# Patient Record
Sex: Female | Born: 1960
Health system: Southern US, Community
[De-identification: ages and names within clinical notes are randomized; demographics above are authoritative.]

## PROBLEM LIST (undated history)

## (undated) ENCOUNTER — Encounter: Attending: Dermatology | Primary: Dermatology

## (undated) ENCOUNTER — Ambulatory Visit

## (undated) ENCOUNTER — Encounter: Attending: Family | Primary: Family

## (undated) ENCOUNTER — Telehealth

## (undated) ENCOUNTER — Encounter

## (undated) ENCOUNTER — Encounter: Payer: PRIVATE HEALTH INSURANCE | Attending: Neurological Surgery | Primary: Neurological Surgery

## (undated) ENCOUNTER — Encounter: Attending: Family Medicine | Primary: Family Medicine

## (undated) ENCOUNTER — Encounter: Attending: Adult Health | Primary: Adult Health

## (undated) ENCOUNTER — Ambulatory Visit: Payer: PRIVATE HEALTH INSURANCE

## (undated) ENCOUNTER — Encounter: Attending: Diagnostic Radiology | Primary: Diagnostic Radiology

## (undated) DIAGNOSIS — K219 Gastro-esophageal reflux disease without esophagitis: Secondary | ICD-10-CM

## (undated) DIAGNOSIS — I10 Essential (primary) hypertension: Secondary | ICD-10-CM

## (undated) DIAGNOSIS — K2970 Gastritis, unspecified, without bleeding: Secondary | ICD-10-CM

## (undated) DIAGNOSIS — M3500 Sicca syndrome, unspecified: Secondary | ICD-10-CM

## (undated) DIAGNOSIS — F329 Major depressive disorder, single episode, unspecified: Secondary | ICD-10-CM

## (undated) DIAGNOSIS — L732 Hidradenitis suppurativa: Secondary | ICD-10-CM

## (undated) DIAGNOSIS — R112 Nausea with vomiting, unspecified: Secondary | ICD-10-CM

## (undated) DIAGNOSIS — Z9889 Other specified postprocedural states: Secondary | ICD-10-CM

## (undated) DIAGNOSIS — F419 Anxiety disorder, unspecified: Secondary | ICD-10-CM

## (undated) DIAGNOSIS — E669 Obesity, unspecified: Secondary | ICD-10-CM

## (undated) DIAGNOSIS — L719 Rosacea, unspecified: Secondary | ICD-10-CM

## (undated) DIAGNOSIS — IMO0002 Reserved for concepts with insufficient information to code with codable children: Secondary | ICD-10-CM

## (undated) DIAGNOSIS — F988 Other specified behavioral and emotional disorders with onset usually occurring in childhood and adolescence: Secondary | ICD-10-CM

## (undated) DIAGNOSIS — F32A Depression, unspecified: Secondary | ICD-10-CM

## (undated) DIAGNOSIS — Z22322 Carrier or suspected carrier of Methicillin resistant Staphylococcus aureus: Secondary | ICD-10-CM

## (undated) DIAGNOSIS — T8859XA Other complications of anesthesia, initial encounter: Secondary | ICD-10-CM

## (undated) DIAGNOSIS — T4145XA Adverse effect of unspecified anesthetic, initial encounter: Secondary | ICD-10-CM

## (undated) DIAGNOSIS — Z8614 Personal history of Methicillin resistant Staphylococcus aureus infection: Secondary | ICD-10-CM

## (undated) DIAGNOSIS — K625 Hemorrhage of anus and rectum: Secondary | ICD-10-CM

## (undated) HISTORY — PX: TONSILLECTOMY: SUR1361

## (undated) HISTORY — DX: Anxiety disorder, unspecified: F41.9

## (undated) HISTORY — DX: Gastritis, unspecified, without bleeding: K29.70

## (undated) HISTORY — DX: Hemorrhage of anus and rectum: K62.5

## (undated) HISTORY — DX: Carrier or suspected carrier of methicillin resistant Staphylococcus aureus: Z22.322

## (undated) HISTORY — DX: Sjogren syndrome, unspecified: M35.00

## (undated) HISTORY — PX: ANTERIOR CRUCIATE LIGAMENT REPAIR: SHX115

## (undated) HISTORY — DX: Reserved for concepts with insufficient information to code with codable children: IMO0002

## (undated) HISTORY — DX: Gastro-esophageal reflux disease without esophagitis: K21.9

## (undated) HISTORY — DX: Depression, unspecified: F32.A

## (undated) HISTORY — PX: MICROLARYNGOSCOPY: SHX5208

## (undated) HISTORY — DX: Rosacea, unspecified: L71.9

## (undated) HISTORY — DX: Essential (primary) hypertension: I10

## (undated) HISTORY — DX: Major depressive disorder, single episode, unspecified: F32.9

## (undated) HISTORY — DX: Obesity, unspecified: E66.9

## (undated) HISTORY — DX: Other specified behavioral and emotional disorders with onset usually occurring in childhood and adolescence: F98.8

## (undated) HISTORY — PX: PILONIDAL CYST DRAINAGE: SHX743

## (undated) HISTORY — DX: Hidradenitis suppurativa: L73.2

---

## 1995-12-01 HISTORY — PX: SPINAL FUSION: SHX223

## 1999-12-29 ENCOUNTER — Encounter: Payer: Self-pay | Admitting: Neurosurgery

## 1999-12-29 ENCOUNTER — Encounter: Admission: RE | Admit: 1999-12-29 | Discharge: 1999-12-29 | Payer: Self-pay | Admitting: Neurosurgery

## 2000-03-15 ENCOUNTER — Encounter: Payer: Self-pay | Admitting: Family Medicine

## 2000-04-25 ENCOUNTER — Encounter: Payer: Self-pay | Admitting: Family Medicine

## 2000-04-25 ENCOUNTER — Encounter: Admission: RE | Admit: 2000-04-25 | Discharge: 2000-04-25 | Payer: Self-pay | Admitting: Family Medicine

## 2002-01-02 ENCOUNTER — Other Ambulatory Visit: Admission: RE | Admit: 2002-01-02 | Discharge: 2002-01-02 | Payer: Self-pay | Admitting: Family Medicine

## 2002-01-08 ENCOUNTER — Encounter: Admission: RE | Admit: 2002-01-08 | Discharge: 2002-01-08 | Payer: Self-pay | Admitting: Family Medicine

## 2002-01-08 ENCOUNTER — Encounter: Payer: Self-pay | Admitting: Family Medicine

## 2003-01-15 ENCOUNTER — Other Ambulatory Visit: Admission: RE | Admit: 2003-01-15 | Discharge: 2003-01-15 | Payer: Self-pay | Admitting: Family Medicine

## 2003-01-21 ENCOUNTER — Encounter: Payer: Self-pay | Admitting: Family Medicine

## 2003-01-21 ENCOUNTER — Encounter: Admission: RE | Admit: 2003-01-21 | Discharge: 2003-01-21 | Payer: Self-pay | Admitting: Family Medicine

## 2004-01-19 ENCOUNTER — Other Ambulatory Visit: Admission: RE | Admit: 2004-01-19 | Discharge: 2004-01-19 | Payer: Self-pay | Admitting: Family Medicine

## 2004-05-12 ENCOUNTER — Encounter: Admission: RE | Admit: 2004-05-12 | Discharge: 2004-05-12 | Payer: Self-pay | Admitting: Neurosurgery

## 2004-06-01 ENCOUNTER — Encounter: Admission: RE | Admit: 2004-06-01 | Discharge: 2004-06-01 | Payer: Self-pay | Admitting: Neurosurgery

## 2004-10-18 ENCOUNTER — Ambulatory Visit: Payer: Self-pay | Admitting: Family Medicine

## 2004-12-21 ENCOUNTER — Ambulatory Visit: Payer: Self-pay | Admitting: Family Medicine

## 2005-03-28 ENCOUNTER — Other Ambulatory Visit: Admission: RE | Admit: 2005-03-28 | Discharge: 2005-03-28 | Payer: Self-pay | Admitting: Family Medicine

## 2005-03-28 ENCOUNTER — Ambulatory Visit: Payer: Self-pay | Admitting: Family Medicine

## 2005-04-21 ENCOUNTER — Encounter: Admission: RE | Admit: 2005-04-21 | Discharge: 2005-04-21 | Payer: Self-pay | Admitting: Family Medicine

## 2005-05-03 ENCOUNTER — Encounter: Admission: RE | Admit: 2005-05-03 | Discharge: 2005-05-03 | Payer: Self-pay | Admitting: Family Medicine

## 2005-11-20 ENCOUNTER — Ambulatory Visit: Payer: Self-pay | Admitting: Family Medicine

## 2006-06-05 ENCOUNTER — Other Ambulatory Visit: Admission: RE | Admit: 2006-06-05 | Discharge: 2006-06-05 | Payer: Self-pay | Admitting: Family Medicine

## 2006-06-05 ENCOUNTER — Ambulatory Visit: Payer: Self-pay | Admitting: Family Medicine

## 2006-06-05 ENCOUNTER — Encounter: Payer: Self-pay | Admitting: Family Medicine

## 2006-06-05 LAB — CONVERTED CEMR LAB: Pap Smear: NORMAL

## 2006-06-27 ENCOUNTER — Encounter: Admission: RE | Admit: 2006-06-27 | Discharge: 2006-06-27 | Payer: Self-pay | Admitting: Family Medicine

## 2006-07-10 ENCOUNTER — Ambulatory Visit: Payer: Self-pay | Admitting: Family Medicine

## 2006-09-12 ENCOUNTER — Ambulatory Visit: Payer: Self-pay | Admitting: Family Medicine

## 2007-01-14 ENCOUNTER — Ambulatory Visit: Payer: Self-pay | Admitting: Family Medicine

## 2007-02-04 ENCOUNTER — Ambulatory Visit: Payer: Self-pay | Admitting: Family Medicine

## 2007-02-21 ENCOUNTER — Encounter: Payer: Self-pay | Admitting: Family Medicine

## 2007-02-21 DIAGNOSIS — F32A Depression, unspecified: Secondary | ICD-10-CM | POA: Insufficient documentation

## 2007-02-21 DIAGNOSIS — J309 Allergic rhinitis, unspecified: Secondary | ICD-10-CM | POA: Insufficient documentation

## 2007-02-21 DIAGNOSIS — L719 Rosacea, unspecified: Secondary | ICD-10-CM | POA: Insufficient documentation

## 2007-02-21 DIAGNOSIS — K219 Gastro-esophageal reflux disease without esophagitis: Secondary | ICD-10-CM | POA: Insufficient documentation

## 2007-02-21 DIAGNOSIS — IMO0002 Reserved for concepts with insufficient information to code with codable children: Secondary | ICD-10-CM | POA: Insufficient documentation

## 2007-02-21 DIAGNOSIS — J329 Chronic sinusitis, unspecified: Secondary | ICD-10-CM | POA: Insufficient documentation

## 2007-02-21 DIAGNOSIS — F411 Generalized anxiety disorder: Secondary | ICD-10-CM | POA: Insufficient documentation

## 2007-02-21 DIAGNOSIS — F329 Major depressive disorder, single episode, unspecified: Secondary | ICD-10-CM

## 2007-04-05 ENCOUNTER — Encounter: Payer: Self-pay | Admitting: Family Medicine

## 2007-04-30 ENCOUNTER — Telehealth: Payer: Self-pay | Admitting: Family Medicine

## 2007-10-09 ENCOUNTER — Ambulatory Visit: Payer: Self-pay | Admitting: Family Medicine

## 2007-10-09 DIAGNOSIS — I10 Essential (primary) hypertension: Secondary | ICD-10-CM | POA: Insufficient documentation

## 2007-10-14 ENCOUNTER — Ambulatory Visit: Payer: Self-pay | Admitting: Family Medicine

## 2007-10-14 DIAGNOSIS — D72829 Elevated white blood cell count, unspecified: Secondary | ICD-10-CM | POA: Insufficient documentation

## 2007-10-17 ENCOUNTER — Telehealth (INDEPENDENT_AMBULATORY_CARE_PROVIDER_SITE_OTHER): Payer: Self-pay | Admitting: *Deleted

## 2007-11-01 ENCOUNTER — Telehealth (INDEPENDENT_AMBULATORY_CARE_PROVIDER_SITE_OTHER): Payer: Self-pay | Admitting: *Deleted

## 2007-11-06 ENCOUNTER — Ambulatory Visit: Payer: Self-pay | Admitting: Family Medicine

## 2007-11-27 ENCOUNTER — Encounter: Payer: Self-pay | Admitting: Family Medicine

## 2007-12-16 ENCOUNTER — Ambulatory Visit: Payer: Self-pay | Admitting: Cardiology

## 2007-12-25 ENCOUNTER — Ambulatory Visit: Payer: Self-pay | Admitting: Family Medicine

## 2007-12-25 ENCOUNTER — Other Ambulatory Visit: Admission: RE | Admit: 2007-12-25 | Discharge: 2007-12-25 | Payer: Self-pay | Admitting: Family Medicine

## 2007-12-25 ENCOUNTER — Encounter: Payer: Self-pay | Admitting: Family Medicine

## 2007-12-26 LAB — CONVERTED CEMR LAB
AST: 30 units/L (ref 0–37)
Alkaline Phosphatase: 68 units/L (ref 39–117)
Basophils Absolute: 0.1 10*3/uL (ref 0.0–0.1)
Basophils Relative: 0.7 % (ref 0.0–1.0)
Bilirubin, Direct: 0.1 mg/dL (ref 0.0–0.3)
CO2: 33 meq/L — ABNORMAL HIGH (ref 19–32)
Calcium: 9.2 mg/dL (ref 8.4–10.5)
Chloride: 101 meq/L (ref 96–112)
Cholesterol: 181 mg/dL (ref 0–200)
Creatinine, Ser: 0.6 mg/dL (ref 0.4–1.2)
Eosinophils Absolute: 0.2 10*3/uL (ref 0.0–0.6)
Eosinophils Relative: 2.6 % (ref 0.0–5.0)
GFR calc Af Amer: 138 mL/min
GFR calc non Af Amer: 114 mL/min
HDL: 45.3 mg/dL (ref >39.0)
MCHC: 34.9 g/dL (ref 30.0–36.0)
Monocytes Absolute: 0.5 10*3/uL (ref 0.2–0.7)
Monocytes Relative: 5.8 % (ref 3.0–11.0)
Neutrophils Relative %: 68.9 % (ref 43.0–77.0)
Platelets: 309 10*3/uL (ref 150–400)
Potassium: 3.7 meq/L (ref 3.5–5.1)
TSH: 1.56 microintl units/mL (ref 0.35–5.50)
Total Protein: 7.3 g/dL (ref 6.0–8.3)
VLDL: 23 mg/dL (ref 0–40)

## 2007-12-31 ENCOUNTER — Encounter (INDEPENDENT_AMBULATORY_CARE_PROVIDER_SITE_OTHER): Payer: Self-pay | Admitting: *Deleted

## 2008-01-20 ENCOUNTER — Encounter: Admission: RE | Admit: 2008-01-20 | Discharge: 2008-01-20 | Payer: Self-pay | Admitting: Family Medicine

## 2008-01-22 ENCOUNTER — Encounter (INDEPENDENT_AMBULATORY_CARE_PROVIDER_SITE_OTHER): Payer: Self-pay | Admitting: *Deleted

## 2008-02-24 ENCOUNTER — Ambulatory Visit: Payer: Self-pay | Admitting: Family Medicine

## 2008-02-24 DIAGNOSIS — R7401 Elevation of levels of liver transaminase levels: Secondary | ICD-10-CM | POA: Insufficient documentation

## 2008-02-24 DIAGNOSIS — R740 Nonspecific elevation of levels of transaminase and lactic acid dehydrogenase [LDH]: Secondary | ICD-10-CM

## 2008-02-25 LAB — CONVERTED CEMR LAB
Albumin: 3.7 g/dL (ref 3.5–5.2)
BUN: 14 mg/dL (ref 6–23)
CO2: 31 meq/L (ref 19–32)
Creatinine, Ser: 0.7 mg/dL (ref 0.4–1.2)
GFR calc non Af Amer: 95 mL/min
Phosphorus: 3.8 mg/dL (ref 2.3–4.6)

## 2008-04-29 ENCOUNTER — Telehealth: Payer: Self-pay | Admitting: Family Medicine

## 2008-05-14 ENCOUNTER — Ambulatory Visit: Payer: Self-pay | Admitting: Family Medicine

## 2008-05-14 DIAGNOSIS — N926 Irregular menstruation, unspecified: Secondary | ICD-10-CM | POA: Insufficient documentation

## 2008-06-18 ENCOUNTER — Ambulatory Visit: Payer: Self-pay | Admitting: Family Medicine

## 2008-06-18 DIAGNOSIS — L732 Hidradenitis suppurativa: Secondary | ICD-10-CM | POA: Insufficient documentation

## 2008-06-18 DIAGNOSIS — N920 Excessive and frequent menstruation with regular cycle: Secondary | ICD-10-CM | POA: Insufficient documentation

## 2008-07-09 ENCOUNTER — Telehealth (INDEPENDENT_AMBULATORY_CARE_PROVIDER_SITE_OTHER): Payer: Self-pay | Admitting: *Deleted

## 2008-07-22 ENCOUNTER — Ambulatory Visit: Payer: Self-pay | Admitting: Family Medicine

## 2008-09-28 ENCOUNTER — Ambulatory Visit: Payer: Self-pay | Admitting: Internal Medicine

## 2008-09-29 ENCOUNTER — Telehealth: Payer: Self-pay | Admitting: Family Medicine

## 2008-10-21 ENCOUNTER — Encounter: Payer: Self-pay | Admitting: Family Medicine

## 2008-10-28 ENCOUNTER — Ambulatory Visit: Payer: Self-pay | Admitting: Family Medicine

## 2008-10-30 HISTORY — PX: UPPER GI ENDOSCOPY: SHX6162

## 2009-01-04 ENCOUNTER — Ambulatory Visit: Payer: Self-pay | Admitting: Family Medicine

## 2009-01-04 ENCOUNTER — Encounter: Payer: Self-pay | Admitting: Family Medicine

## 2009-01-04 ENCOUNTER — Other Ambulatory Visit: Admission: RE | Admit: 2009-01-04 | Discharge: 2009-01-04 | Payer: Self-pay | Admitting: Family Medicine

## 2009-01-05 LAB — CONVERTED CEMR LAB
Albumin: 3.4 g/dL — ABNORMAL LOW (ref 3.5–5.2)
Basophils Absolute: 0 10*3/uL (ref 0.0–0.1)
Basophils Relative: 0.4 % (ref 0.0–3.0)
Calcium: 8.8 mg/dL (ref 8.4–10.5)
Eosinophils Absolute: 0.2 10*3/uL (ref 0.0–0.7)
Eosinophils Relative: 2.2 % (ref 0.0–5.0)
GFR calc Af Amer: 137 mL/min
GFR calc non Af Amer: 113 mL/min
HCT: 36.4 % (ref 36.0–46.0)
HDL: 47.5 mg/dL (ref >39.0)
MCHC: 35.5 g/dL (ref 30.0–36.0)
MCV: 93.1 fL (ref 78.0–100.0)
Monocytes Absolute: 0.6 10*3/uL (ref 0.1–1.0)
Neutrophils Relative %: 68 % (ref 43.0–77.0)
Platelets: 247 10*3/uL (ref 150–400)
Potassium: 4.3 meq/L (ref 3.5–5.1)
RBC: 3.91 M/uL (ref 3.87–5.11)
RDW: 11.5 % (ref 11.5–14.6)
TSH: 2.37 microintl units/mL (ref 0.35–5.50)
Total Bilirubin: 0.5 mg/dL (ref 0.3–1.2)
Total Protein: 6.7 g/dL (ref 6.0–8.3)
Triglycerides: 131 mg/dL (ref 0–149)

## 2009-01-06 ENCOUNTER — Telehealth: Payer: Self-pay | Admitting: Family Medicine

## 2009-01-06 ENCOUNTER — Encounter (INDEPENDENT_AMBULATORY_CARE_PROVIDER_SITE_OTHER): Payer: Self-pay | Admitting: *Deleted

## 2009-01-25 ENCOUNTER — Encounter: Admission: RE | Admit: 2009-01-25 | Discharge: 2009-01-25 | Payer: Self-pay | Admitting: Family Medicine

## 2009-01-25 ENCOUNTER — Ambulatory Visit: Payer: Self-pay | Admitting: Internal Medicine

## 2009-01-25 IMAGING — MG MM DIGITAL SCREENING BILAT W/ CAD
4 series · 4 of 4 positions shown · non-contrast
Comparison: none

DG SCREEN MAMMOGRAM BILATERAL
Bilateral CC and MLO view(s) were taken.

DIGITAL SCREENING MAMMOGRAM WITH CAD:
There are scattered fibroglandular densities.  A possible mass is noted in the left breast.  Spot 
compression views and possibly sonography are recommended for further evaluation.  In the right 
breast, no masses or malignant type calcifications are identified.  Compared with prior studies.

[R CC]
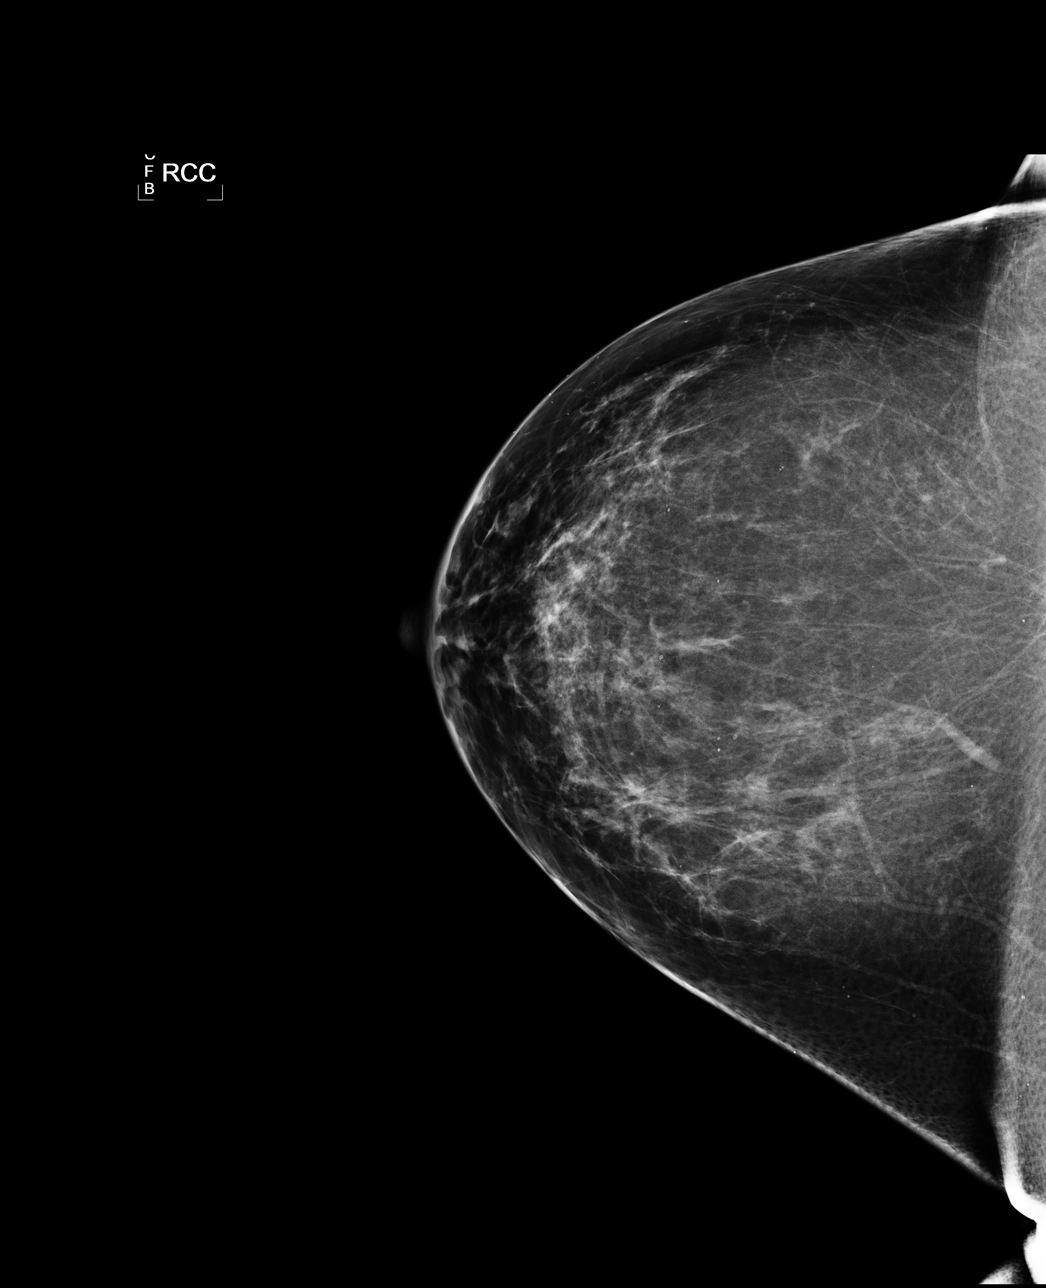

[L CC]
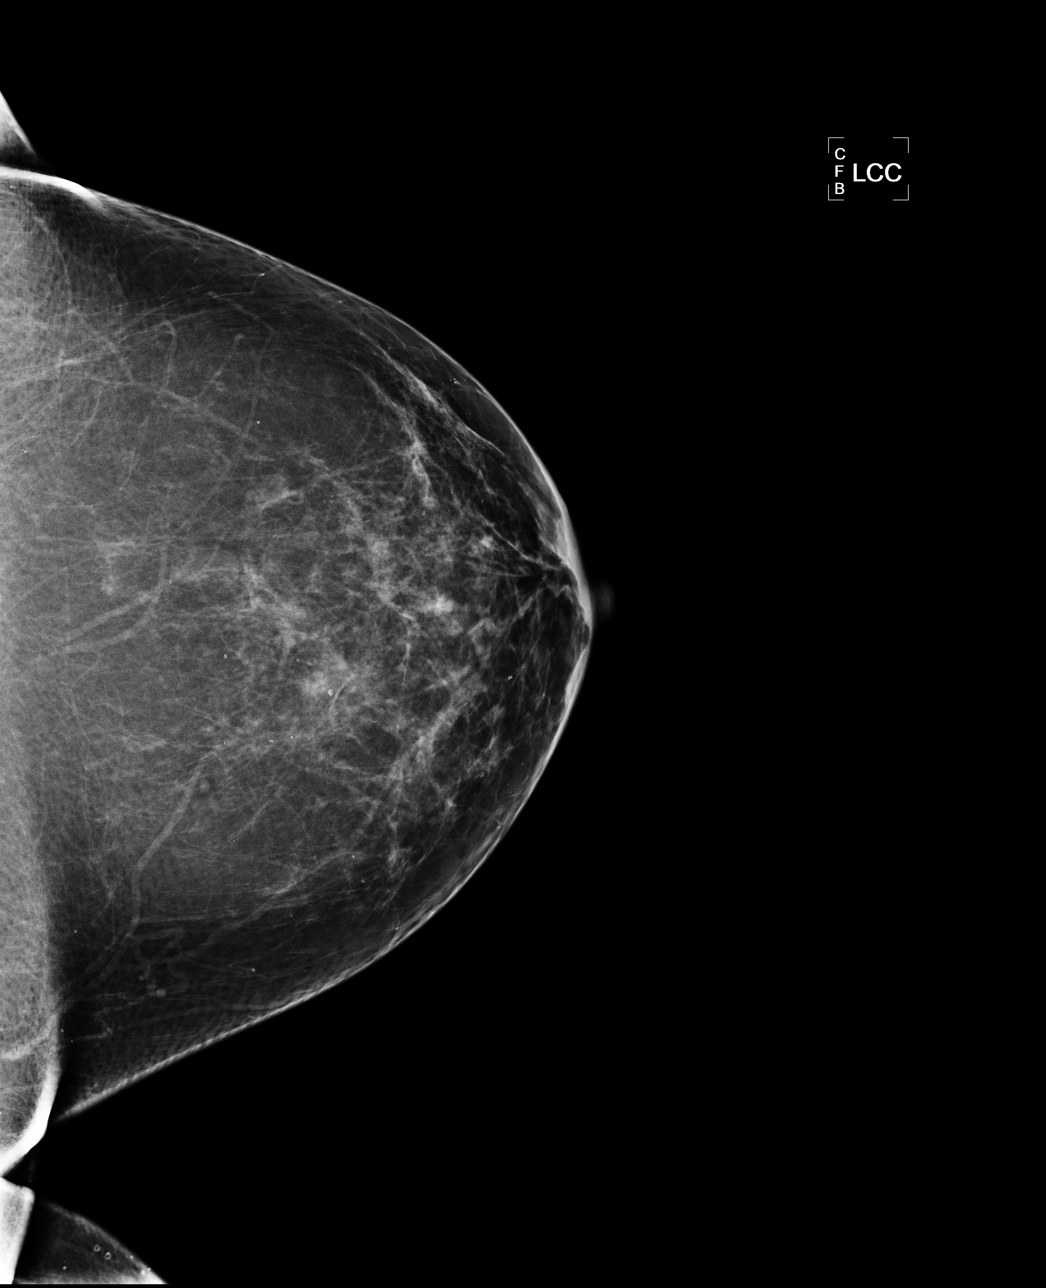

[L MLO]
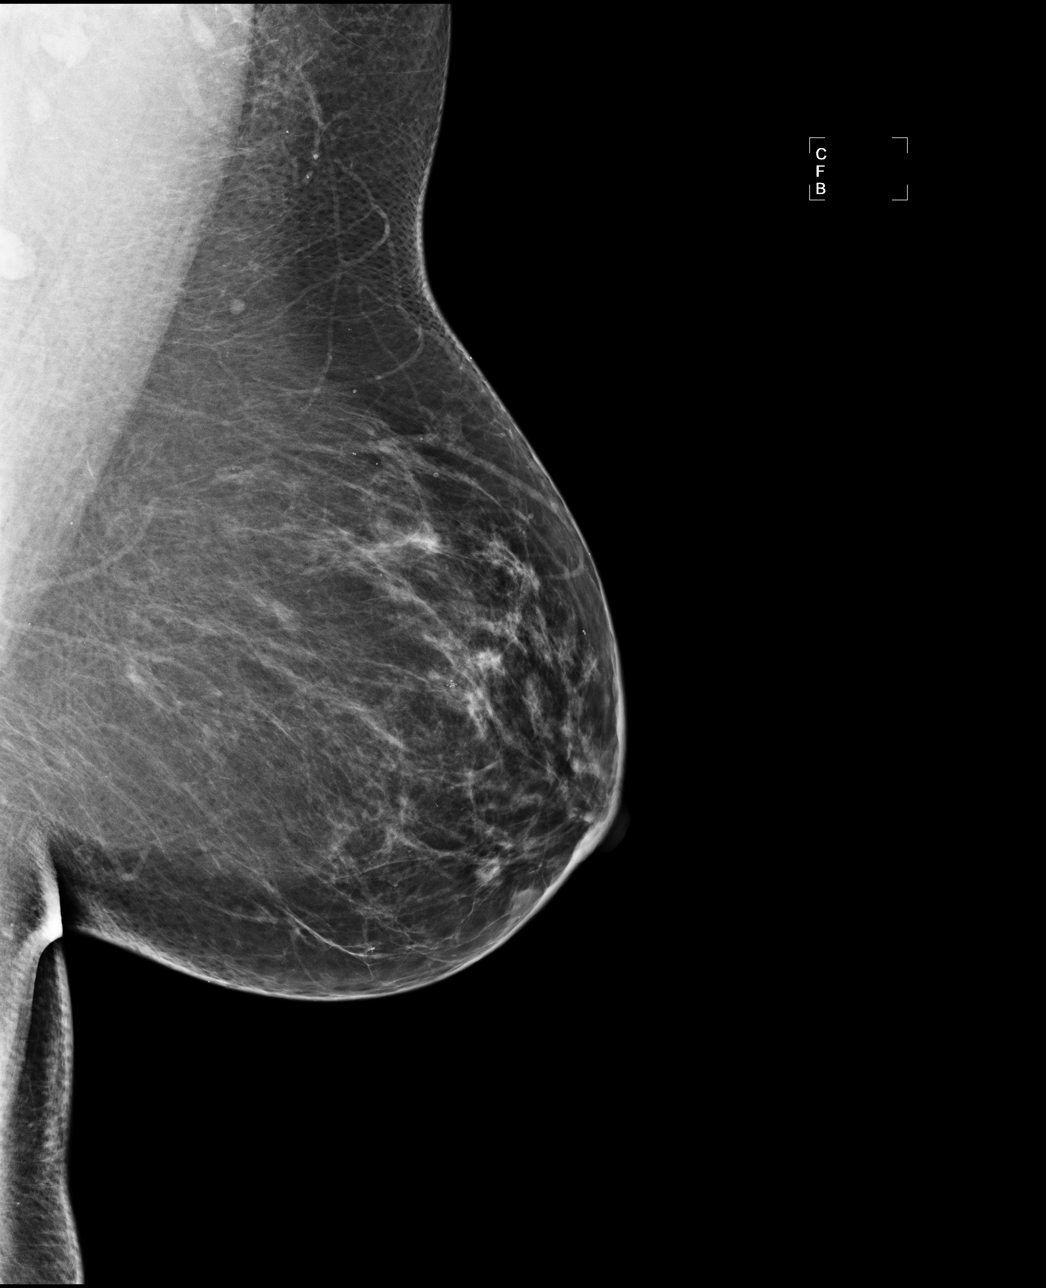

[R MLO]
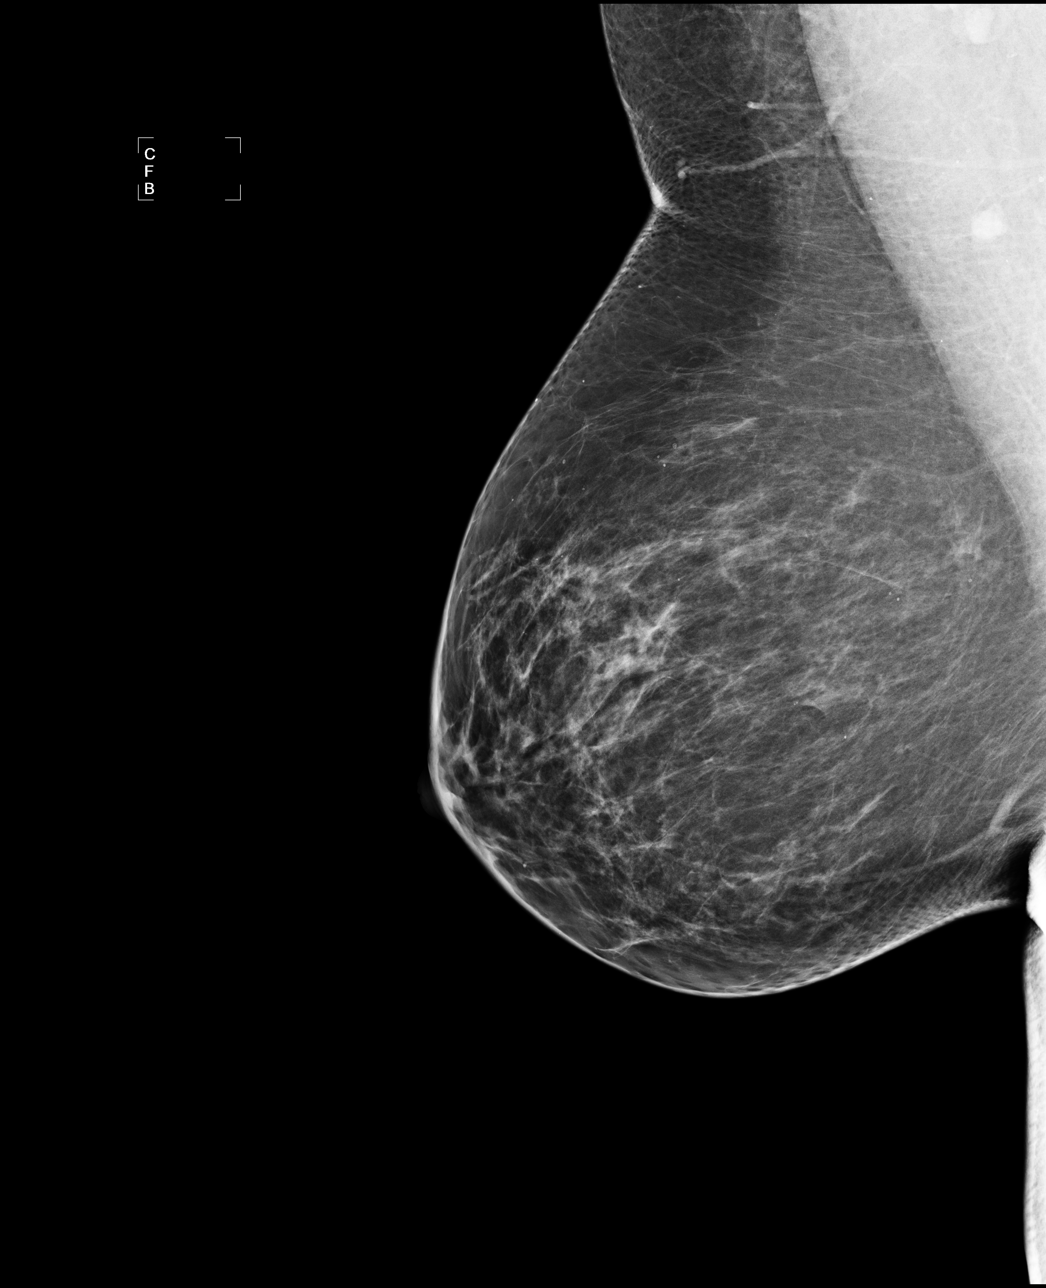

[4 of 4 positions shown; findings below may reference images not displayed]

IMPRESSION: Possible mass, left breast.  Additional evaluation is indicated.  The patient will be contacted for
additional studies and a supplementary report will follow.  No specific mammographic evidence of 
malignancy, right breast.

ASSESSMENT: Need additional imaging evaluation and/or prior mammograms for comparison - BI-RADS 0

Further imaging of the left breast.
ANALYZED BY COMPUTER AIDED DETECTION. , THIS PROCEDURE WAS A DIGITAL MAMMOGRAM.

## 2009-01-26 ENCOUNTER — Ambulatory Visit: Payer: Self-pay | Admitting: Internal Medicine

## 2009-01-26 HISTORY — PX: UPPER GASTROINTESTINAL ENDOSCOPY: SHX188

## 2009-01-27 ENCOUNTER — Encounter: Payer: Self-pay | Admitting: Internal Medicine

## 2009-01-28 ENCOUNTER — Encounter: Admission: RE | Admit: 2009-01-28 | Discharge: 2009-01-28 | Payer: Self-pay | Admitting: Family Medicine

## 2009-02-01 ENCOUNTER — Telehealth: Payer: Self-pay | Admitting: Family Medicine

## 2009-02-04 ENCOUNTER — Telehealth: Payer: Self-pay | Admitting: Family Medicine

## 2009-02-06 ENCOUNTER — Encounter: Admission: RE | Admit: 2009-02-06 | Discharge: 2009-02-06 | Payer: Self-pay | Admitting: Family Medicine

## 2009-02-11 ENCOUNTER — Telehealth: Payer: Self-pay | Admitting: Family Medicine

## 2009-02-23 ENCOUNTER — Encounter: Admission: RE | Admit: 2009-02-23 | Discharge: 2009-02-23 | Payer: Self-pay | Admitting: Family Medicine

## 2009-02-24 ENCOUNTER — Telehealth: Payer: Self-pay | Admitting: Family Medicine

## 2009-02-27 ENCOUNTER — Encounter: Admission: RE | Admit: 2009-02-27 | Discharge: 2009-02-27 | Payer: Self-pay | Admitting: Family Medicine

## 2009-03-09 ENCOUNTER — Telehealth: Payer: Self-pay | Admitting: Family Medicine

## 2009-03-12 ENCOUNTER — Encounter: Payer: Self-pay | Admitting: Family Medicine

## 2009-03-30 ENCOUNTER — Inpatient Hospital Stay (HOSPITAL_COMMUNITY): Admission: RE | Admit: 2009-03-30 | Discharge: 2009-04-01 | Payer: Self-pay | Admitting: Neurosurgery

## 2009-03-30 HISTORY — PX: SPINAL FUSION: SHX223

## 2009-04-12 ENCOUNTER — Encounter: Payer: Self-pay | Admitting: Family Medicine

## 2009-04-27 ENCOUNTER — Ambulatory Visit: Payer: Self-pay | Admitting: Internal Medicine

## 2009-05-05 ENCOUNTER — Encounter: Payer: Self-pay | Admitting: Family Medicine

## 2009-06-10 ENCOUNTER — Encounter: Payer: Self-pay | Admitting: Family Medicine

## 2009-07-28 ENCOUNTER — Encounter: Payer: Self-pay | Admitting: Family Medicine

## 2009-09-01 ENCOUNTER — Ambulatory Visit: Payer: Self-pay | Admitting: Family Medicine

## 2009-09-01 DIAGNOSIS — F988 Other specified behavioral and emotional disorders with onset usually occurring in childhood and adolescence: Secondary | ICD-10-CM | POA: Insufficient documentation

## 2009-10-13 ENCOUNTER — Ambulatory Visit: Payer: Self-pay | Admitting: Family Medicine

## 2009-11-23 ENCOUNTER — Telehealth: Payer: Self-pay | Admitting: Family Medicine

## 2009-12-13 ENCOUNTER — Encounter: Payer: Self-pay | Admitting: Family Medicine

## 2009-12-30 ENCOUNTER — Telehealth: Payer: Self-pay | Admitting: Family Medicine

## 2010-01-10 ENCOUNTER — Telehealth: Payer: Self-pay | Admitting: Family Medicine

## 2010-01-11 ENCOUNTER — Other Ambulatory Visit: Admission: RE | Admit: 2010-01-11 | Discharge: 2010-01-11 | Payer: Self-pay | Admitting: Family Medicine

## 2010-01-11 ENCOUNTER — Ambulatory Visit: Payer: Self-pay | Admitting: Family Medicine

## 2010-01-11 LAB — CONVERTED CEMR LAB
Basophils Relative: 0.5 % (ref 0.0–3.0)
Bilirubin, Direct: 0.1 mg/dL (ref 0.0–0.3)
CO2: 28 meq/L (ref 19–32)
Calcium: 8.8 mg/dL (ref 8.4–10.5)
Chloride: 109 meq/L (ref 96–112)
Creatinine, Ser: 0.6 mg/dL (ref 0.4–1.2)
Eosinophils Absolute: 0.1 10*3/uL (ref 0.0–0.7)
Eosinophils Relative: 2.6 % (ref 0.0–5.0)
GFR calc non Af Amer: 112.82 mL/min (ref >60)
Glucose, Bld: 102 mg/dL — ABNORMAL HIGH (ref 70–99)
Lymphs Abs: 1.4 10*3/uL (ref 0.7–4.0)
MCHC: 34.4 g/dL (ref 30.0–36.0)
Neutro Abs: 3.6 10*3/uL (ref 1.4–7.7)
Neutrophils Relative %: 65.5 % (ref 43.0–77.0)
Platelets: 276 10*3/uL (ref 150.0–400.0)
Potassium: 4.2 meq/L (ref 3.5–5.1)
RBC: 4.18 M/uL (ref 3.87–5.11)
RDW: 11.6 % (ref 11.5–14.6)
TSH: 1.6 microintl units/mL (ref 0.35–5.50)
Total CHOL/HDL Ratio: 4
Total Protein: 6.8 g/dL (ref 6.0–8.3)
WBC: 5.4 10*3/uL (ref 4.5–10.5)

## 2010-01-13 ENCOUNTER — Encounter: Payer: Self-pay | Admitting: Family Medicine

## 2010-01-18 ENCOUNTER — Telehealth: Payer: Self-pay | Admitting: Family Medicine

## 2010-02-08 ENCOUNTER — Encounter: Payer: Self-pay | Admitting: Family Medicine

## 2010-02-08 ENCOUNTER — Encounter: Admission: RE | Admit: 2010-02-08 | Discharge: 2010-02-08 | Payer: Self-pay | Admitting: Family Medicine

## 2010-02-10 ENCOUNTER — Encounter: Payer: Self-pay | Admitting: Family Medicine

## 2010-02-17 ENCOUNTER — Telehealth: Payer: Self-pay | Admitting: Family Medicine

## 2010-02-17 ENCOUNTER — Ambulatory Visit: Payer: Self-pay | Admitting: Family Medicine

## 2010-02-21 ENCOUNTER — Telehealth: Payer: Self-pay | Admitting: Family Medicine

## 2010-02-24 ENCOUNTER — Ambulatory Visit: Payer: Self-pay | Admitting: Family Medicine

## 2010-03-23 ENCOUNTER — Telehealth: Payer: Self-pay | Admitting: Family Medicine

## 2010-05-09 ENCOUNTER — Telehealth: Payer: Self-pay | Admitting: Family Medicine

## 2010-06-07 ENCOUNTER — Telehealth: Payer: Self-pay | Admitting: Family Medicine

## 2010-06-20 ENCOUNTER — Encounter: Payer: Self-pay | Admitting: Family Medicine

## 2010-07-21 ENCOUNTER — Telehealth: Payer: Self-pay | Admitting: Family Medicine

## 2010-08-22 ENCOUNTER — Telehealth: Payer: Self-pay | Admitting: Family Medicine

## 2010-08-30 ENCOUNTER — Telehealth: Payer: Self-pay | Admitting: Family Medicine

## 2010-09-19 ENCOUNTER — Telehealth: Payer: Self-pay | Admitting: Family Medicine

## 2010-11-20 ENCOUNTER — Encounter: Payer: Self-pay | Admitting: Family Medicine

## 2010-11-22 ENCOUNTER — Telehealth: Payer: Self-pay | Admitting: Family Medicine

## 2010-11-27 LAB — CONVERTED CEMR LAB
ALT: 20 units/L (ref 0–35)
Albumin: 3.5 g/dL (ref 3.5–5.2)
Alkaline Phosphatase: 61 units/L (ref 39–117)
Basophils Absolute: 0.1 10*3/uL (ref 0.0–0.1)
Basophils Relative: 0.6 % (ref 0.0–1.0)
Bilirubin, Direct: 0.1 mg/dL (ref 0.0–0.3)
Calcium: 9.3 mg/dL (ref 8.4–10.5)
Chloride: 99 meq/L (ref 96–112)
Eosinophils Absolute: 0.3 10*3/uL (ref 0.0–0.6)
Eosinophils Relative: 2.3 % (ref 0.0–5.0)
MCV: 91.2 fL (ref 78.0–100.0)
Monocytes Absolute: 0.6 10*3/uL (ref 0.2–0.7)
Monocytes Relative: 5.2 % (ref 3.0–11.0)
Neutro Abs: 8.1 10*3/uL — ABNORMAL HIGH (ref 1.4–7.7)
Neutrophils Relative %: 71.3 % (ref 43.0–77.0)
Platelets: 342 10*3/uL (ref 150–400)
Potassium: 4 meq/L (ref 3.5–5.1)
RBC: 4.28 M/uL (ref 3.87–5.11)
RDW: 11.8 % (ref 11.5–14.6)
Sodium: 138 meq/L (ref 135–145)
Total Bilirubin: 0.6 mg/dL (ref 0.3–1.2)
Total CHOL/HDL Ratio: 3.6
Total Protein: 7.2 g/dL (ref 6.0–8.3)

## 2010-12-01 NOTE — Letter (Signed)
Summary: Psychological Assessment/[facility]  Psychological Assessment/[facility]   Imported By: Maryln Gottron 01/17/2010 10:41:32  _____________________________________________________________________  External Attachment:    Type:   Image     Comment:   External Document

## 2010-12-01 NOTE — Miscellaneous (Signed)
Summary: pap screening   Clinical Lists Changes  Observations: Added new observation of PAP SMEAR: normal (01/11/2010 15:06)      Preventive Care Screening  Pap Smear:    Date:  01/11/2010    Results:  normal

## 2010-12-01 NOTE — Assessment & Plan Note (Signed)
Summary: CPX AND FOLLOW UP/[facility]   Vital Signs:  Patient profile:   50 year old [name] Height:      64.5 inches Weight:      208.75 pounds BMI:     35.41 Temp:     98 degrees F oral Pulse rate:   88 / minute Pulse rhythm:   regular BP sitting:   118 / 76  (left arm) Cuff size:   regular  Vitals Entered By: Lewanda Rife LPN (January 11, 2010 2:36 PM) CC: complete physical with pap and breast exam LMP 11/30/09   History of Present Illness: here for health mt exam and to review chronic med problems   wt is down 12 more lbs  has been slow and steady  is eating low carb- is healthy  does stay away from some fats -- but wants to see how cholesterol does   no exercise yet - but is getting to start to ride bike  wants to start slow  back is feeling better   bp good 118/76- no problems with this  menses-- on the 2nd pack od seasonale -- skipped first menses successfully  is falling into rhythm no gyn problems - except her hydradenitis - they are painful  is interested in seeing derm for this - in the groin and under her arms   pap 3/10- needs to do this today   stress level is lower due to getting through a divorce   mam 4/10  self exam - no lumps or changes   had las this am   Td 04   does not get flu shots   quit taking veramyst last fall and did well  allergy season is coming --   she did try some of her son's vyvanse instead of adderall to see how it worked  is a lot more focused but has been more irritable -- 30 mg  try a few more days and let me know if she likes it better    Allergies: 1)  ! Celebrex (Celecoxib) 2)  ! Flonase (Fluticasone Propionate) 3)  ! Wellbutrin Xl (Bupropion Hcl) 4)  ! Zoloft  Past History:  Past Medical History: Last updated: 09/01/2009 Perimenupause  hoarseness  family hx of AAA CHEST PAIN-UNSPECIFIED (ICD-786.50) NECK PAIN (ICD-723.1) MAMMOGRAM, ABNORMAL, LEFT (ICD-793.80) SORE THROAT (ICD-462) OBESITY (ICD-278.00) URI  (ICD-465.9) EXCESSIVE OR FREQUENT MENSTRUATION (ICD-626.2) HIDRADENITIS SUPPURATIVA (ICD-705.83) IRREGULAR MENSTRUAL CYCLE (ICD-626.4) TRANSAMINASES, SERUM, ELEVATED (ICD-790.4) LEUKOCYTOSIS (ICD-288.60) ESSENTIAL HYPERTENSION (ICD-401.9) Hx of ROSACEA (ICD-695.3) Hx of DEGENERATIVE DISC DISEASE (ICD-722.6) Hx of SINUSITIS, CHRONIC (ICD-473.9) GERD (ICD-530.81) Benign epiglottis lesion DEPRESSION (ICD-311) ANXIETY (ICD-300.00) ALLERGIC RHINITIS (ICD-477.9) ADD    counselor- Erlene Senters ENT- Willeen Cass neurosx-- Channing Mutters  Past Surgical History: Last updated: 04/27/2009 C- section Spinal fusion- L4 L5 S1 Tonsillectomy ACL knee surgery Pyelonidal cyst 3/10 EGD - epiglottis lesion, mild gastritis spondylosis C-spine injections  Family History: Last updated: 09/01/2009 father with esoph ca mother with 2 aneurysms - abdominal  GM with colon ca Maunt with breast ca brother HTN sister HTN and obesity Family History of Diabetes:  son with ADD  Social History: Last updated: 04/09/2009 divorced- very stressful divorce Never Smoked  Occupation: Counsellor. Alcohol Use - yes-2 Daily Caffeine Use-1 Illicit Drug Use - no Regular Exercise - yes  Risk Factors: Exercise: yes (04/09/2009)  Risk Factors: Smoking Status: never (12/25/2007)  Review of Systems General:  Denies fatigue, fever, loss of appetite, and malaise. Eyes:  Denies blurring and eye irritation. CV:  Denies  chest pain or discomfort, palpitations, shortness of breath with exertion, and swelling of feet. Resp:  Denies cough and wheezing. GI:  Denies abdominal pain, change in bowel habits, nausea, and vomiting. GU:  Denies abnormal vaginal bleeding, discharge, dysuria, and urinary frequency. MS:  Complains of stiffness; denies joint redness, joint swelling, and muscle aches. Derm:  Denies itching, lesion(s), poor wound healing, and rash. Neuro:  Denies numbness and tingling. Psych:  Denies anxiety and  depression. Endo:  Denies excessive thirst and excessive urination. Heme:  Denies abnormal bruising and bleeding.  Physical Exam  General:  overweight but generally well appearing significant wt loss noted  Head:  normocephalic, atraumatic, and no abnormalities observed.   Eyes:  vision grossly intact, pupils equal, pupils round, and pupils reactive to light.   Ears:  R ear normal and L ear normal.   Nose:  no nasal discharge.   Mouth:  pharynx pink and moist.   Neck:  supple with full rom and no masses or thyromegally, no JVD or carotid bruit  Chest Wall:  No deformities, masses, or tenderness noted. Breasts:  No mass, nodules, thickening, tenderness, bulging, retraction, inflamation, nipple discharge or skin changes noted.   Lungs:  Normal respiratory effort, chest expands symmetrically. Lungs are clear to auscultation, no crackles or wheezes. Heart:  Regular rate and rhythm; no murmurs, rubs,  or bruits. Abdomen:  Bowel sounds positive,abdomen soft and non-tender without masses, organomegaly or hernias noted. no renal bruits  Genitalia:  Normal introitus for age, no external lesions, no vaginal discharge, mucosa pink and moist, no vaginal or cervical lesions, no vaginal atrophy, no friaility or hemorrhage, normal uterus size and position, no adnexal masses or tenderness Msk:  No deformity or scoliosis noted of thoracic or lumbar spine.  no acute joint changes  Pulses:  R and L carotid,radial,femoral,dorsalis pedis and posterior tibial pulses are full and equal bilaterally Extremities:  No clubbing, cyanosis, edema, or deformity noted with normal full range of motion of all joints.   Neurologic:  sensation intact to light touch, gait normal, and DTRs symmetrical and normal.   Skin:  Intact without suspicious lesions or rashes slt tanned with some lentigos  Cervical Nodes:  No lymphadenopathy noted Axillary Nodes:  No palpable lymphadenopathy Inguinal Nodes:  No significant  adenopathy Psych:  normal affect, talkative and pleasant    Impression & Recommendations:  Problem # 1:  HEALTH MAINTENANCE EXAM (ICD-V70.0) Assessment Comment Only  reviewed health habits including diet, exercise and skin cancer prevention reviewed health maintenance list and family history rev recent labs with good chol control and stable liver enzymes   Orders: Prescription Created Electronically [phone])  Problem # 2:  ROUTINE GYNECOLOGICAL EXAMINATION (ICD-V72.31) Assessment: Comment Only  annual exam  continue long phase oc-- doing better on that  pend pap report   Orders: Prescription Created Electronically [phone])  Problem # 3:  ATTENTION DEFICIT DISORDER (ICD-314.00) Assessment: Unchanged  pt is trying several days of vyvanse and will update me with how it works  Orders: Administrator, arts [phone])  Problem # 4:  HIDRADENITIS SUPPURATIVA (ICD-705.83) Assessment: Deteriorated ref to derm - is troublesome and recurrent in groin Orders: Dermatology Referral (Derma) Prescription Created Electronically [phone])  Problem # 5:  IRREGULAR MENSTRUAL CYCLE (ICD-626.4) Assessment: Improved  so far doing well with long cycle pill- will continue to follow Her updated medication list for this problem includes:    Seasonique 0.15-0.03 &0.01 Mg Tabs (Levonorgest-eth estrad 91-day) .Marland Kitchen... Take by mouth as directed  Orders: Prescription Created Electronically [phone])  Problem # 6:  ESSENTIAL HYPERTENSION (ICD-401.9) Assessment: Unchanged  HTN very well controlled enc exercise rev labs  Her updated medication list for this problem includes:    Lisinopril-hydrochlorothiazide 20-12.5 Mg Tabs (Lisinopril-hydrochlorothiazide) .Marland Kitchen... 1 by mouth once daily  BP today: 118/76 Prior BP: 110/64 (10/13/2009)  Labs Reviewed: K+: 4.3 (01/04/2009) Creat: : 0.6 (01/04/2009)   Chol: 165 (01/04/2009)   HDL: 47.5 (01/04/2009)   LDL: 91 (01/04/2009)   TG: 131  (01/04/2009)  Orders: Prescription Created Electronically [id number])  Complete Medication List: 1)  Pantoprazole Sodium 40 Mg Tbec (Pantoprazole sodium) .Marland Kitchen.. 1 by mouth two times a day 30 minutes before breakfast and supper 2)  Lisinopril-hydrochlorothiazide 20-12.5 Mg Tabs (Lisinopril-hydrochlorothiazide) .Marland Kitchen.. 1 by mouth once daily 3)  Adderall 20 Mg Tabs (Amphetamine-dextroamphetamine) .Marland Kitchen.. 1 by mouth in am and 1 by mouth at lunchtime 4)  Seasonique 0.15-0.03 &0.01 Mg Tabs (Levonorgest-eth estrad 91-day) .... Take by mouth as directed 5)  Veramyst 27.5 Mcg/spray Susp (Fluticasone furoate) .... 2 sprays in each nostril once daily  Other Orders: Radiology Referral (Radiology)  Patient Instructions: 1)  here is px for veramyst if you need it  2)  we will do mammogram referral at check out  3)  we will do dermatology referral at check out  4)  keep working on healthy diet and gradually add exercise 5)  I sent px to your pharmacy Prescriptions: PANTOPRAZOLE SODIUM 40 MG TBEC (PANTOPRAZOLE SODIUM) 1 by mouth two times a day 30 minutes before breakfast and supper  #180 x 3   Entered and Authorized by:   Judith Part MD   Signed by:   Judith Part MD on 01/11/2010   Method used:   Electronically to        [facility] [phone]* (retail)       [address] [place], [place]  [address]       Ph: [phone] or [phone]       Fax: [phone]   RxID:   [id number] LISINOPRIL-HYDROCHLOROTHIAZIDE 20-12.5 MG  TABS (LISINOPRIL-HYDROCHLOROTHIAZIDE) 1 by mouth once daily  #90 x 3   Entered and Authorized by:   Judith Part MD   Signed by:   Judith Part MD on 01/11/2010   Method used:   Electronically to        [facility] [phone]* (retail)       [address] [place], [place]  [address]       Ph: [phone] or [phone]       Fax: [phone]   RxID:   [id number] VERAMYST 27.5 MCG/SPRAY SUSP (FLUTICASONE  FUROATE) 2 sprays in each nostril once daily  #1 mdi x 11   Entered and Authorized by:   Judith Part MD   Signed by:   Judith Part MD on 01/11/2010   Method used:   Print then Give to Patient   RxID:   [id number]   Current Allergies (reviewed today): ! CELEBREX (CELECOXIB) ! FLONASE (FLUTICASONE PROPIONATE) ! WELLBUTRIN XL (BUPROPION HCL) ! ZOLOFT

## 2010-12-01 NOTE — Consult Note (Signed)
Summary: [facility]   [facility]   Imported By: Lanelle Bal 02/22/2010 09:31:29  _____________________________________________________________________  External Attachment:    Type:   Image     Comment:   External Document

## 2010-12-01 NOTE — Progress Notes (Signed)
Summary: refill request for vyvanse  Phone Note Refill Request Call back at Work Phone [phone] Message from:  Patient  Refills Requested: Medication #1:  VYVANSE 40 MG CAPS 1 by mouth once daily. Please call when ready.    Initial call taken by: Lowella Petties CMA,  June 07, 2010 9:59 AM  Follow-up for Phone Call        printed in put in nurse in box for pickup  Follow-up by: Judith Part MD,  June 07, 2010 1:08 PM  Additional Follow-up for Phone Call Additional follow up Details #1::        Patient notified via telephone Rx ready for pick up will be left at front desk. Additional Follow-up by: Linde Gillis CMA Duncan Dull),  June 07, 2010 2:06 PM    Prescriptions: VYVANSE 40 MG CAPS (LISDEXAMFETAMINE DIMESYLATE) 1 by mouth once daily  #30 x 0   Entered and Authorized by:   Judith Part MD   Signed by:   Judith Part MD on 06/07/2010   Method used:   Print then Give to Patient   RxID:   [id number]

## 2010-12-01 NOTE — Progress Notes (Signed)
Summary: refill request for vyvanse  Phone Note Refill Request Call back at Work Phone [phone] Message from:  Patient  Refills Requested: Medication #1:  VYVANSE 30 MG CAPS 1 by mouth once daily Pt is asking if she can increase the dose to 40 mg's.  She says this is working better for her than adderall, but that it could be better.  She says if not she will continue with 30 mg's.  She knows this will not be ready until monday.  Initial call taken by: Lowella Petties CMA,  February 17, 2010 1:44 PM  Follow-up for Phone Call        that is fine  printed in put in nurse in box for pickup  Follow-up by: Judith Part MD,  February 21, 2010 8:12 AM  Additional Follow-up for Phone Call Additional follow up Details #1::        Patient notified as instructed by telephone. Prescription left at front desk.  Lewanda Rife LPN  February 21, 2010 11:35 AM .    New/Updated Medications: VYVANSE 40 MG CAPS (LISDEXAMFETAMINE DIMESYLATE) 1 by mouth once daily Prescriptions: VYVANSE 40 MG CAPS (LISDEXAMFETAMINE DIMESYLATE) 1 by mouth once daily  #30 x 0   Entered and Authorized by:   Judith Part MD   Signed by:   Judith Part MD on 02/21/2010   Method used:   Print then Give to Patient   RxID:   [id number]

## 2010-12-01 NOTE — Progress Notes (Signed)
Summary: refill request for adderall  Phone Note Refill Request Call back at [phone] Message from:  Patient  Refills Requested: Medication #1:  ADDERALL 20 MG TABS 1 by mouth in am and 1 by mouth at lunchtime Please call pt when ready.  Initial call taken by: Lowella Petties CMA,  December 30, 2009 8:34 AM  Follow-up for Phone Call        printed in put in nurse in box for pickup  Follow-up by: Judith Part MD,  December 30, 2009 8:57 AM  Additional Follow-up for Phone Call Additional follow up Details #1::        Left message for patient to call back. Prescription left at front desk. Lewanda Rife LPN  December 30, 6008 10:13 AM   Advised pt script is ready for pick up. Additional Follow-up by: Lowella Petties CMA,  December 30, 2009 12:11 PM    Prescriptions: ADDERALL 20 MG TABS (AMPHETAMINE-DEXTROAMPHETAMINE) 1 by mouth in am and 1 by mouth at lunchtime  #60 x 0   Entered and Authorized by:   Judith Part MD   Signed by:   Judith Part MD on 12/30/2009   Method used:   Print then Give to Patient   RxID:   [id number]

## 2010-12-01 NOTE — Assessment & Plan Note (Signed)
Summary: CHECK PLACE ON NOSE/CLE   Vital Signs:  Patient profile:   50 year old female Weight:      204.25 pounds Temp:     98.6 degrees F oral Pulse rate:   88 / minute Pulse rhythm:   regular BP sitting:   120 / 78  (left arm) Cuff size:   large  Vitals Entered By: Sydell Axon LPN (February 17, 2010 11:26 AM) CC: Check sore under right nostril, area is sore and has been crusted over   History of Present Illness: Pirritated area under nose at the crease of the right lateral nostril. Has been there a few days but now scabbed and red. Started as an indivuidual lesion. Has been using neosporin. No h/o MRSA in her personally but her son is a wrestler and her nephew had MRSA.   Problems Prior to Update: 1)  Attention Deficit Disorder  (ICD-314.00) 2)  Obesity  (ICD-278.00) 3)  Excessive or Frequent Menstruation  (ICD-626.2) 4)  Hidradenitis Suppurativa  (ICD-705.83) 5)  Irregular Menstrual Cycle  (ICD-626.4) 6)  Transaminases, Serum, Elevated  (ICD-790.4) 7)  Routine Gynecological Examination  (ICD-V72.31) 8)  Health Maintenance Exam  (ICD-V70.0) 9)  Leukocytosis  (ICD-288.60) 10)  Essential Hypertension  (ICD-401.9) 11)  Hx of Rosacea  (ICD-695.3) 12)  Hx of Degenerative Disc Disease  (ICD-722.6) 13)  Hx of Sinusitis, Chronic  (ICD-473.9) 14)  Gerd  (ICD-530.81) 15)  Depression  (ICD-311) 16)  Anxiety  (ICD-300.00) 17)  Allergic Rhinitis  (ICD-477.9) 18)  Other Screening Mammogram  (ICD-V76.12)  Medications Prior to Update: 1)  Pantoprazole Sodium 40 Mg Tbec (Pantoprazole Sodium) .Marland Kitchen.. 1 By Mouth Two Times A Day 30 Minutes Before Breakfast and Supper 2)  Lisinopril-Hydrochlorothiazide 20-12.5 Mg  Tabs (Lisinopril-Hydrochlorothiazide) .Marland Kitchen.. 1 By Mouth Once Daily 3)  Seasonique 0.15-0.03 &0.01 Mg Tabs (Levonorgest-Eth Estrad 91-Day) .... Take By Mouth As Directed 4)  Veramyst 27.5 Mcg/spray Susp (Fluticasone Furoate) .... 2 Sprays in Each Nostril Once Daily 5)  Vyvanse 30 Mg Caps  (Lisdexamfetamine Dimesylate) .Marland Kitchen.. 1 By Mouth Once Daily  Allergies: 1)  ! Celebrex (Celecoxib) 2)  ! Flonase (Fluticasone Propionate) 3)  ! Wellbutrin Xl (Bupropion Hcl) 4)  ! Zoloft  Physical Exam  General:  overweight but generally well appearing significant wt loss noted  Head:  normocephalic, atraumatic, and no abnormalities observed.   Eyes:  Conjunctiva clear bilaterally.  Ears:  R ear normal and L ear normal.   Nose:  Right nostril lareally with 1-35mm pustiulres in a groupin with one lateral area 5x89mm of coalesceence and crusting, all on erythem base. Mouth:  pharynx pink and moist.     Impression & Recommendations:  Problem # 1:  CELLULITIS OF RIGHT NOSTRIL (ICD-682.0) Assessment New Probably Impetigo but poss MRSA, very improb HSVI. Treat with MRSA level Bactrim and topically with Bactroban. Her updated medication list for this problem includes:    Bactrim Ds 800-160 Mg Tabs (Sulfamethoxazole-trimethoprim) .Marland Kitchen... 2 tabs by mouth two times a day  Complete Medication List: 1)  Pantoprazole Sodium 40 Mg Tbec (Pantoprazole sodium) .Marland Kitchen.. 1 by mouth two times a day 30 minutes before breakfast and supper 2)  Lisinopril-hydrochlorothiazide 20-12.5 Mg Tabs (Lisinopril-hydrochlorothiazide) .Marland Kitchen.. 1 by mouth once daily 3)  Seasonique 0.15-0.03 &0.01 Mg Tabs (Levonorgest-eth estrad 91-day) .... Take by mouth as directed 4)  Veramyst 27.5 Mcg/spray Susp (Fluticasone furoate) .... 2 sprays in each nostril once daily 5)  Vyvanse 30 Mg Caps (Lisdexamfetamine dimesylate) .Marland Kitchen.. 1 by mouth once daily  6)  Bactrim Ds 800-160 Mg Tabs (Sulfamethoxazole-trimethoprim) .... 2 tabs by mouth two times a day 7)  Mupirocin 2 % Oint (Mupirocin) .... Apply two times a day  Patient Instructions: 1)  RTC one week Prescriptions: MUPIROCIN 2 % OINT (MUPIROCIN) apply two times a day  #1 tube x 0   Entered and Authorized by:   Shaune Leeks MD   Signed by:   Shaune Leeks MD on 02/17/2010    Method used:   Electronically to        [facility] [phone]* (retail)       [address] [place], [place]  [address]       Ph: [phone] or [phone]       Fax: [phone]   RxID:   [id number] BACTRIM DS 800-160 MG TABS (SULFAMETHOXAZOLE-TRIMETHOPRIM) 2 tabs by mouth two times a day  #40 x 0   Entered and Authorized by:   Shaune Leeks MD   Signed by:   Shaune Leeks MD on 02/17/2010   Method used:   Electronically to        [facility] [phone]* (retail)       [address] [place], [place]  [address]       Ph: [phone] or [phone]       Fax: [phone]   RxID:   [id number]   Current Allergies (reviewed today): ! CELEBREX (CELECOXIB) ! FLONASE (FLUTICASONE PROPIONATE) ! WELLBUTRIN XL (BUPROPION HCL) ! ZOLOFT

## 2010-12-01 NOTE — Progress Notes (Signed)
Summary: refill request for vyvanse  Phone Note Refill Request Call back at Work Phone [phone] Message from:  Patient  Refills Requested: Medication #1:  VYVANSE 40 MG CAPS 1 by mouth once daily. Please call pt when ready.  Initial call taken by: Lowella Petties CMA,  July 21, 2010 10:45 AM  Follow-up for Phone Call        printed in put in nurse in box for pickup  Follow-up by: Judith Part MD,  July 21, 2010 11:41 AM  Additional Follow-up for Phone Call Additional follow up Details #1::        Patient notified that rx is up front and ready for pickup. Additional Follow-up by: Sydell Axon LPN,  July 21, 2010 4:20 PM    Prescriptions: VYVANSE 40 MG CAPS (LISDEXAMFETAMINE DIMESYLATE) 1 by mouth once daily  #30 x 0   Entered and Authorized by:   Judith Part MD   Signed by:   Judith Part MD on 07/21/2010   Method used:   Print then Give to Patient   RxID:   [id number]

## 2010-12-01 NOTE — Letter (Signed)
Summary: [facility]  [facility]   Imported By: Lanelle Bal 07/05/2010 08:52:29  _____________________________________________________________________  External Attachment:    Type:   Image     Comment:   External Document

## 2010-12-01 NOTE — Assessment & Plan Note (Signed)
Summary: 1 WK F/U DLO   Vital Signs:  Patient profile:   50 year old female Weight:      202.75 pounds Temp:     98.6 degrees F oral Pulse rate:   84 / minute Pulse rhythm:   regular BP sitting:   102 / 70  (left arm) Cuff size:   large  Vitals Entered By: Sydell Axon LPN (February 24, 2010 10:43 AM) CC: One week followup on sore place on face   History of Present Illness: Pt here for followup of localized infection in the opening to the right nostril, one week ago was in the angle and looked like Impetigo but I covered her for MRSA as it started with one small pustule and her son is a wrestler who has been around MRSA. She did not tolerate Bactrim DS 2 two times a day but is tolerating one twice a day. The lesion has improved but she had read to remove the scab frequently which I think has prolonged the problem. It is now principally on the medial side of the nare of the right nostril. Continued no fever or chills.  Problems Prior to Update: 1)  Cellulitis of Right Nostril  (ICD-682.0) 2)  Attention Deficit Disorder  (ICD-314.00) 3)  Obesity  (ICD-278.00) 4)  Excessive or Frequent Menstruation  (ICD-626.2) 5)  Hidradenitis Suppurativa  (ICD-705.83) 6)  Irregular Menstrual Cycle  (ICD-626.4) 7)  Transaminases, Serum, Elevated  (ICD-790.4) 8)  Routine Gynecological Examination  (ICD-V72.31) 9)  Health Maintenance Exam  (ICD-V70.0) 10)  Leukocytosis  (ICD-288.60) 11)  Essential Hypertension  (ICD-401.9) 12)  Hx of Rosacea  (ICD-695.3) 13)  Hx of Degenerative Disc Disease  (ICD-722.6) 14)  Hx of Sinusitis, Chronic  (ICD-473.9) 15)  Gerd  (ICD-530.81) 16)  Depression  (ICD-311) 17)  Anxiety  (ICD-300.00) 18)  Allergic Rhinitis  (ICD-477.9) 19)  Other Screening Mammogram  (ICD-V76.12)  Medications Prior to Update: 1)  Pantoprazole Sodium 40 Mg Tbec (Pantoprazole Sodium) .Marland Kitchen.. 1 By Mouth Two Times A Day 30 Minutes Before Breakfast and Supper 2)  Lisinopril-Hydrochlorothiazide  20-12.5 Mg  Tabs (Lisinopril-Hydrochlorothiazide) .Marland Kitchen.. 1 By Mouth Once Daily 3)  Seasonique 0.15-0.03 &0.01 Mg Tabs (Levonorgest-Eth Estrad 91-Day) .... Take By Mouth As Directed 4)  Veramyst 27.5 Mcg/spray Susp (Fluticasone Furoate) .... 2 Sprays in Each Nostril Once Daily 5)  Bactrim Ds 800-160 Mg Tabs (Sulfamethoxazole-Trimethoprim) .... 2 Tabs By Mouth Two Times A Day 6)  Mupirocin 2 % Oint (Mupirocin) .... Apply Two Times A Day 7)  Vyvanse 40 Mg Caps (Lisdexamfetamine Dimesylate) .Marland Kitchen.. 1 By Mouth Once Daily  Allergies: 1)  ! Celebrex (Celecoxib) 2)  ! Flonase (Fluticasone Propionate) 3)  ! Wellbutrin Xl (Bupropion Hcl) 4)  ! Zoloft  Physical Exam  General:  overweight but generally well appearing significant wt loss noted  Head:  normocephalic, atraumatic, and no abnormalities observed.   Eyes:  Conjunctiva clear bilaterally.  Ears:  R ear normal and L ear normal.   Nose:  Right nostril  with one medial  area 5x16mm of coalesceence and crusting on erythem base.   Impression & Recommendations:  Problem # 1:  CELLULITIS OF RIGHT NOSTRIL (ICD-682.0) Assessment Improved  Less angry looking. Scab probably still therre from chronically removing it. Discussed further trmt at length. Could stop Bactrim  as not at MRSA dose. Cont Mupiricin until lesion totally healed. Her updated medication list for this problem includes:    Bactrim Ds 800-160 Mg Tabs (Sulfamethoxazole-trimethoprim) .Marland Kitchen... 2 tabs by  mouth two times a day  Complete Medication List: 1)  Pantoprazole Sodium 40 Mg Tbec (Pantoprazole sodium) .Marland Kitchen.. 1 by mouth two times a day 30 minutes before breakfast and supper 2)  Lisinopril-hydrochlorothiazide 20-12.5 Mg Tabs (Lisinopril-hydrochlorothiazide) .Marland Kitchen.. 1 by mouth once daily 3)  Seasonique 0.15-0.03 &0.01 Mg Tabs (Levonorgest-eth estrad 91-day) .... Take by mouth as directed 4)  Veramyst 27.5 Mcg/spray Susp (Fluticasone furoate) .... 2 sprays in each nostril once daily 5)   Bactrim Ds 800-160 Mg Tabs (Sulfamethoxazole-trimethoprim) .... 2 tabs by mouth two times a day 6)  Mupirocin 2 % Oint (Mupirocin) .... Apply two times a day 7)  Vyvanse 40 Mg Caps (Lisdexamfetamine dimesylate) .Marland Kitchen.. 1 by mouth once daily  Patient Instructions: 1)  RTC if lesion doesn't totally resolve.  Current Allergies (reviewed today): ! CELEBREX (CELECOXIB) ! FLONASE (FLUTICASONE PROPIONATE) ! WELLBUTRIN XL (BUPROPION HCL) ! ZOLOFT

## 2010-12-01 NOTE — Progress Notes (Signed)
Summary: vyvance  Phone Note Refill Request Call back at [phone] Message from:  Patient on Mar 23, 2010 8:46 AM  Refills Requested: Medication #1:  VYVANSE 40 MG CAPS 1 by mouth once daily.  Method Requested: Pick up at Office Initial call taken by: Melody Comas,  Mar 23, 2010 8:46 AM  Follow-up for Phone Call        printed in put in nurse in box for pickup  Follow-up by: Judith Part MD,  Mar 23, 2010 9:05 AM  Additional Follow-up for Phone Call Additional follow up Details #1::        Patient notified that rx is up front and ready for pick up .  Additional Follow-up by: Melody Comas,  Mar 23, 2010 11:17 AM    Prescriptions: VYVANSE 40 MG CAPS (LISDEXAMFETAMINE DIMESYLATE) 1 by mouth once daily  #30 x 0   Entered and Authorized by:   Judith Part MD   Signed by:   Judith Part MD on 03/23/2010   Method used:   Print then Give to Patient   RxID:   [id number]

## 2010-12-01 NOTE — Letter (Signed)
Summary: [facility]  [facility]   Imported By: Lanelle Bal 02/15/2010 13:06:00  _____________________________________________________________________  External Attachment:    Type:   Image     Comment:   External Document

## 2010-12-01 NOTE — Letter (Signed)
Summary: Results Follow up Letter  [place] at [facility]  [address] [place], [place] [address]   Phone: [phone]  Fax: [phone]    01/13/2010 MRN: [id number]    Patricia [name] [address] [place], [place]  [address]    Dear Ms. Mack Guise,  The following are the results of your recent test(s):  Test         Result    Pap Smear:        Normal __X___  Not Normal _____ Comments: ______________________________________________________ Cholesterol: LDL(Bad cholesterol):         Your goal is less than:         HDL (Good cholesterol):       Your goal is more than: Comments:  ______________________________________________________ Mammogram:        Normal _____  Not Normal _____ Comments:  ___________________________________________________________________ Hemoccult:        Normal _____  Not normal _______ Comments:    _____________________________________________________________________ Other Tests:    We routinely do not discuss normal results over the telephone.  If you desire a copy of the results, or you have any questions about this information we can discuss them at your next office visit.   Sincerely,    Idamae Schuller Tower,MD  MT/ri

## 2010-12-01 NOTE — Progress Notes (Signed)
Summary: refill request for seasonique  Phone Note Refill Request Message from:  Patient  Refills Requested: Medication #1:  SEASONIQUE 0.15-0.03 &0.01 MG TABS take by mouth as directed Pt is asking to be switched to generic, if you think that will work just as well for her. [facility] [address]  Initial call taken by: Lowella Petties CMA, AAMA,  August 30, 2010 8:15 AM  Follow-up for Phone Call        if generic is availible I'm fine with that  Follow-up by: Judith Part MD,  August 30, 2010 8:43 AM  Additional Follow-up for Phone Call Additional follow up Details #1::        Medication phoned to pharmacy.  Additional Follow-up by: Delilah Shan CMA (AAMA),  August 30, 2010 2:40 PM    Prescriptions: SEASONIQUE 0.15-0.03 &0.01 MG TABS (LEVONORGEST-ETH ESTRAD 91-DAY) take by mouth as directed  #3 months x 3   Entered and Authorized by:   Judith Part MD   Signed by:   Sydell Axon LPN on 54/06/8118   Method used:   Telephoned to ...       [facility] [phone]* (retail)       [address] [place], [place]  [address]       Ph: [phone] or [phone]       Fax: [phone]   RxID:   [id number]

## 2010-12-01 NOTE — Progress Notes (Signed)
Summary: feeling nauseated   Phone Note Call from Patient Call back at Work Phone [phone]   Caller: Patient Call For: Dr. Hetty Ely  Summary of Call: Patient was seen for place on her nose on 4-21. She is using the mypirocin and taking bactrum. She says that she started this on Thursday and has felt fine up until yesterday. She started feeling nauseated  and "heavy headed" yesterday and today feels a little worse. She has not had any vomitting or diarrhea. She wants to know if it could be because of the medication. Please advise. Initial call taken by: Melody Comas,  February 21, 2010 2:39 PM  Follow-up for Phone Call        Yes it could. If the area looks better, go to Bactrim ONE two times a day instead of two.  Follow-up by: Shaune Leeks MD,  February 21, 2010 2:57 PM  Additional Follow-up for Phone Call Additional follow up Details #1::        Patient says that the area does look better and will cut back to one time daily, she also wants to know if she should continue washing the scab away. She says that it is no longer the yellowish crusty scab, but more of a white color and softer. Please advise.  Melody Comas  February 21, 2010 3:07 PM leave aljne, less chance of scar. Additional Follow-up by: Shaune Leeks MD,  February 21, 2010 3:21 PM    Additional Follow-up for Phone Call Additional follow up Details #2::    Patient notified.  Follow-up by: Melody Comas,  February 21, 2010 3:37 PM

## 2010-12-01 NOTE — Letter (Signed)
Summary: [facility]  [facility]   Imported By: Lanelle Bal 12/27/2009 11:23:38  _____________________________________________________________________  External Attachment:    Type:   Image     Comment:   External Document

## 2010-12-01 NOTE — Miscellaneous (Signed)
Summary: mammogram screening   Clinical Lists Changes  Observations: Added new observation of MAMMO DUE: 01/2011 (02/10/2010 11:53) Added new observation of MAMMOGRAM: normal (02/08/2010 11:53)      Preventive Care Screening  Mammogram:    Date:  02/08/2010    Next Due:  01/2011    Results:  normal

## 2010-12-01 NOTE — Progress Notes (Signed)
Summary: vyvance  Phone Note Refill Request Call back at Work Phone [phone] Message from:  Patient on May 09, 2010 9:23 AM  Refills Requested: Medication #1:  VYVANSE 40 MG CAPS 1 by mouth once daily.  Method Requested: Pick up at Office Initial call taken by: Melody Comas,  May 09, 2010 9:23 AM Caller: Patient  Follow-up for Phone Call        printed in put in nurse in box for pickup  Follow-up by: Judith Part MD,  May 09, 2010 9:50 AM  Additional Follow-up for Phone Call Additional follow up Details #1::        Patient notified as instructed by telephone. Prescription left at front desk. Lewanda Rife LPN  May 09, 2010 11:18 AM     Prescriptions: VYVANSE 40 MG CAPS (LISDEXAMFETAMINE DIMESYLATE) 1 by mouth once daily  #30 x 0   Entered and Authorized by:   Judith Part MD   Signed by:   Judith Part MD on 05/09/2010   Method used:   Print then Give to Patient   RxID:   [id number]

## 2010-12-01 NOTE — Progress Notes (Signed)
Summary: wants labs tomorrow  Phone Note Call from Patient Call back at [phone]   Caller: Patient Call For: Judith Part MD Summary of Call: Pt is coming in tomorrow afternoon for a physical and she would like to come in tomorrow morning for labs.  Please advise on what to order. Initial call taken by: Lowella Petties CMA,  January 10, 2010 1:53 PM  Follow-up for Phone Call        thanks- please check wellness panel and lipids v70.0 Follow-up by: Judith Part MD,  January 10, 2010 3:58 PM  Additional Follow-up for Phone Call Additional follow up Details #1::        Scheduled. Additional Follow-up by: Delilah Shan CMA Duncan Dull),  January 10, 2010 4:39 PM

## 2010-12-01 NOTE — Progress Notes (Signed)
Summary: rx for vyvanse  Phone Note Call from Patient Call back at Home Phone [phone]   Caller: Patient Call For: Judith Part MD Summary of Call: Patient is asking for rx for vyvanse. She says that she really doesn't like the adderall  at all. Please call patient when r x is ready.  Initial call taken by: Melody Comas,  November 22, 2010 4:43 PM  Follow-up for Phone Call        ok- will switch back  please note what side eff she has from adderall and put in her allergy list as intolerance printed in put in nurse in box for pickup  Follow-up by: Judith Part MD,  November 22, 2010 5:20 PM  Additional Follow-up for Phone Call Additional follow up Details #1::        Patient doesn't have any side eff from the adderall she just doesn't feel that it works as well as the vyvanse. Rena left rx at front office.  Additional Follow-up by: Melody Comas,  November 23, 2010 3:52 PM    New/Updated Medications: VYVANSE 40 MG CAPS (LISDEXAMFETAMINE DIMESYLATE) 1 by mouth once daily Prescriptions: VYVANSE 40 MG CAPS (LISDEXAMFETAMINE DIMESYLATE) 1 by mouth once daily  #30 x 0   Entered and Authorized by:   Judith Part MD   Signed by:   Judith Part MD on 11/22/2010   Method used:   Print then Give to Patient   RxID:   [id number]

## 2010-12-01 NOTE — Letter (Signed)
Summary: Generic Letter  [place] at [facility]  [address] [place], [place] [address]   Phone: [phone]  Fax: [phone]    01/06/2009     Patricia [name] [address] [place], [place]  [address]    Dear [name],   Your pap smear was normal, please repeat screening in one year     Sincerely,   Oralia Rud at [facility]

## 2010-12-01 NOTE — Progress Notes (Signed)
Summary: refill request for vyvanse  Phone Note Refill Request Call back at Work Phone [phone] Message from:  Patient  Refills Requested: Medication #1:  VYVANSE 40 MG CAPS 1 by mouth once daily. Please call when ready.  Initial call taken by: Lowella Petties CMA, AAMA,  August 22, 2010 8:05 AM  Follow-up for Phone Call        printed in put in nurse in box for pickup  Follow-up by: Judith Part MD,  August 22, 2010 8:53 AM  Additional Follow-up for Phone Call Additional follow up Details #1::        Patient notified as instructed by telephone. Prescription left at front desk. Lewanda Rife LPN  August 22, 2010 10:50 AM     Prescriptions: VYVANSE 40 MG CAPS (LISDEXAMFETAMINE DIMESYLATE) 1 by mouth once daily  #30 x 0   Entered and Authorized by:   Judith Part MD   Signed by:   Judith Part MD on 08/22/2010   Method used:   Print then Give to Patient   RxID:   [id number]

## 2010-12-01 NOTE — Progress Notes (Signed)
Summary:   ADDERALL 20 MG TABS  Phone Note Refill Request Call back at [phone] Message from:  Patient on November 23, 2009 11:52 AM  Refills Requested: Medication #1:  ADDERALL 20 MG TABS 1 by mouth in am and 1 by mouth at lunchtime Please call patient when ready for pick up    Method Requested: Pick up at Office Initial call taken by: Melody Comas,  November 23, 2009 11:53 AM  Follow-up for Phone Call        printed in put in nurse in box for pickup  Follow-up by: Judith Part MD,  November 23, 2009 1:41 PM  Additional Follow-up for Phone Call Additional follow up Details #1::        [facility] advising pt ok to pick up. Additional Follow-up by: Lowella Petties CMA,  November 23, 2009 3:11 PM    Prescriptions: ADDERALL 20 MG TABS (AMPHETAMINE-DEXTROAMPHETAMINE) 1 by mouth in am and 1 by mouth at lunchtime  #60 x 0   Entered and Authorized by:   Judith Part MD   Signed by:   Judith Part MD on 11/23/2009   Method used:   Print then Give to Patient   RxID:   [id number]

## 2010-12-01 NOTE — Progress Notes (Signed)
Summary: needs to change from vyvanse for now  Phone Note Call from Patient Call back at Home Phone [phone]   Caller: Patient Call For: Judith Part MD Summary of Call: Pt is in between insurances and she is asking if she can change back to adderall for now, vyvanse is too costly.  Her new insurance goes into affect on Jan 1.  The vyvanse is working well for her so she will want to go back on it. Initial call taken by: Lowella Petties CMA, AAMA,  September 19, 2010 8:37 AM  Follow-up for Phone Call        will change back to adderal for now since I cannot print it from home -- put it in as historical -- either someone else can sign it for me or I will sign it when I come back-- let me know Follow-up by: Judith Part MD,  September 20, 2010 9:38 AM  Additional Follow-up for Phone Call Additional follow up Details #1::        I spoke with pt and she still has enough Vyvanse for tomorrow. I offererd to get another Dr. to write rx but pt said she would rather wait and see if Dr. Milinda Antis is back on 09/21/10 and get rx on 09/21/10. Lewanda Rife LPN  September 20, 2010 11:58 AM     Additional Follow-up for Phone Call Additional follow up Details #2::    Patient advised rx ready for pick up Follow-up by: Benny Lennert CMA Duncan Dull),  September 21, 2010 10:10 AM  New/Updated Medications: ADDERALL 20 MG TABS (AMPHETAMINE-DEXTROAMPHETAMINE) 1 by mouth each am and one each lunchtime Prescriptions: ADDERALL 20 MG TABS (AMPHETAMINE-DEXTROAMPHETAMINE) 1 by mouth each am and one each lunchtime  #60 x 0   Entered and Authorized by:   Judith Part MD   Signed by:   Judith Part MD on 09/21/2010   Method used:   Print then Give to Patient   RxID:   [id number] ADDERALL 20 MG TABS (AMPHETAMINE-DEXTROAMPHETAMINE) 1 by mouth each am and one each lunchtime  #60 x 0   Entered and Authorized by:   Judith Part MD   Signed by:   Lewanda Rife LPN on 84/69/6295   Method used:   Historical  RxID:   [id number]

## 2010-12-01 NOTE — Progress Notes (Signed)
Summary: wants script for vyvanse  Phone Note Call from Patient Call back at [phone]   Caller: Patient Call For: Judith Part MD Summary of Call: Pt states she has been trying vyvanse 30 mg and this has worked well for her.  She is asking for a script.  Please call when ready. Initial call taken by: Lowella Petties CMA,  January 18, 2010 2:39 PM  Follow-up for Phone Call        printed in put in nurse in box for pickup   Follow-up by: Judith Part MD,  January 18, 2010 5:04 PM  Additional Follow-up for Phone Call Additional follow up Details #1::        Patient notified as instructed by telephone. Prescription left at front desk. Lewanda Rife LPN  January 18, 2010 5:14 PM     New/Updated Medications: VYVANSE 30 MG CAPS (LISDEXAMFETAMINE DIMESYLATE) 1 by mouth once daily Prescriptions: VYVANSE 30 MG CAPS (LISDEXAMFETAMINE DIMESYLATE) 1 by mouth once daily  #30 x 0   Entered and Authorized by:   Judith Part MD   Signed by:   Judith Part MD on 01/18/2010   Method used:   Print then Give to Patient   RxID:   [id number]

## 2010-12-01 NOTE — Letter (Signed)
Summary: Results Follow up Letter  [place] at [facility]  [address] [place], [place] [address]   Phone: [phone]  Fax: [phone]    02/10/2010 MRN: [id number]    [facility] [address] [place], [place]  [address]    Dear Ms. Mack Guise,  The following are the results of your recent test(s):  Test         Result    Pap Smear:        Normal _____  Not Normal _____ Comments: ______________________________________________________ Cholesterol: LDL(Bad cholesterol):         Your goal is less than:         HDL (Good cholesterol):       Your goal is more than: Comments:  ______________________________________________________ Mammogram:        Normal __X___  Not Normal _____ Comments:Please repeat mammogram in one year.  ___________________________________________________________________ Hemoccult:        Normal _____  Not normal _______ Comments:    _____________________________________________________________________ Other Tests:    We routinely do not discuss normal results over the telephone.  If you desire a copy of the results, or you have any questions about this information we can discuss them at your next office visit.   Sincerely,    Idamae Schuller Tower,MD  MT/ri

## 2010-12-14 ENCOUNTER — Telehealth: Payer: Self-pay | Admitting: Family Medicine

## 2010-12-15 ENCOUNTER — Other Ambulatory Visit: Payer: Self-pay | Admitting: Neurosurgery

## 2010-12-15 DIAGNOSIS — M47812 Spondylosis without myelopathy or radiculopathy, cervical region: Secondary | ICD-10-CM

## 2010-12-16 ENCOUNTER — Encounter: Payer: Self-pay | Admitting: Family Medicine

## 2010-12-16 ENCOUNTER — Ambulatory Visit
Admission: RE | Admit: 2010-12-16 | Discharge: 2010-12-16 | Disposition: A | Discharge status: Home / Self Care | Payer: No Typology Code available for payment source | Source: Ambulatory Visit | Attending: Neurosurgery | Admitting: Neurosurgery

## 2010-12-16 DIAGNOSIS — M47812 Spondylosis without myelopathy or radiculopathy, cervical region: Secondary | ICD-10-CM

## 2010-12-20 ENCOUNTER — Telehealth: Payer: Self-pay | Admitting: Family Medicine

## 2010-12-21 ENCOUNTER — Telehealth: Payer: Self-pay | Admitting: Family Medicine

## 2010-12-21 NOTE — Progress Notes (Signed)
Summary: prior Berkley Harvey is needed for vyvanse  Phone Note Other Incoming   Caller: Tedd Sias Summary of Call: Prior Berkley Harvey is needed for vyvanse, form is on your shelf.  This is a renewal.              Lowella Petties CMA, AAMA  December 14, 2010 11:33 AM   Follow-up for Phone Call        form done and in nurse in box  please scan it  Follow-up by: Judith Part MD,  December 14, 2010 1:47 PM  Additional Follow-up for Phone Call Additional follow up Details #1::        completed form faxed to [phone] and then given to [facility].Lewanda Rife LPN  December 14, 2010 2:27 PM    New Allergies: * ADDERALL New Allergies: * ADDERALL

## 2010-12-27 ENCOUNTER — Telehealth: Payer: Self-pay | Admitting: Family Medicine

## 2010-12-27 NOTE — Progress Notes (Signed)
Summary: prior auth denied for vyvanse  Phone Note Other Incoming   Caller: Tedd Sias Summary of Call: Prior Berkley Harvey was denied for vyvanse.  They are saying she has to try extended release adderall first.  She had plain adderall before.  She has not picked up vyvanse script that was written yesterday.  Please call her when new script is ready.  [phone].     Lowella Petties CMA, AAMA  December 21, 2010 9:49 AM   Follow-up for Phone Call        ok - please shred the prev px  will try adderall ext rel- let me know how it does .printed Follow-up by: Judith Part MD,  December 21, 2010 1:15 PM  Additional Follow-up for Phone Call Additional follow up Details #1::        Advised pt script is ready to pick up.  Shredded vyvanse script.          Lowella Petties CMA, AAMA  December 21, 2010 1:52 PM     New/Updated Medications: ADDERALL XR 30 MG XR24H-CAP (AMPHETAMINE-DEXTROAMPHETAMINE) 1 by mouth once daily Prescriptions: ADDERALL XR 30 MG XR24H-CAP (AMPHETAMINE-DEXTROAMPHETAMINE) 1 by mouth once daily  #30 x 0   Entered and Authorized by:   Judith Part MD   Signed by:   Judith Part MD on 12/21/2010   Method used:   Print then Give to Patient   RxID:   [id number]

## 2010-12-27 NOTE — Progress Notes (Signed)
Summary: refill request for vyvanse  Phone Note Refill Request Message from:  Patient  Refills Requested: Medication #1:  VYVANSE 40 MG CAPS 1 by mouth once daily. Please call pt when ready.  Initial call taken by: Lowella Petties CMA, AAMA,  December 20, 2010 11:19 AM  Follow-up for Phone Call        printed in put in nurse in box for pickup  Follow-up by: Judith Part MD,  December 20, 2010 11:57 AM  Additional Follow-up for Phone Call Additional follow up Details #1::        Patient notified that rx is up front and ready for pickup. Additional Follow-up by: Sydell Axon LPN,  December 20, 2010 1:21 PM    Prescriptions: VYVANSE 40 MG CAPS (LISDEXAMFETAMINE DIMESYLATE) 1 by mouth once daily  #30 x 0   Entered and Authorized by:   Judith Part MD   Signed by:   Judith Part MD on 12/20/2010   Method used:   Print then Give to Patient   RxID:   [id number]

## 2011-01-05 NOTE — Medication Information (Signed)
Summary: Denial of coverage for Vyvnase  Denial of coverage for Vyvnase   Imported By: Maryln Gottron 12/26/2010 15:27:26  _____________________________________________________________________  External Attachment:    Type:   Image     Comment:   External Document

## 2011-01-05 NOTE — Progress Notes (Signed)
Summary: adderall is making her feel sleepy  Phone Note Call from Patient Call back at Home Phone [phone]   Caller: Patient Call For: Judith Part MD Summary of Call: Patient says that since starting the adderall she has been feeling very tired, all she wants to is lie down and go to bed. She is asking if she should give it a little more time of if she should try something else. Patient says that she paid $60 for the adderall so she would like to try and take it.  Initial call taken by: Melody Comas,  December 27, 2010 10:29 AM  Follow-up for Phone Call        I advise trying a bit longer to see if that improves -- it is very difficult to individually predict which med does well for different people  it is a shame her ins will not pay for the drug she did well on  if this does not improve ask her to get me a list of covered alternative meds from insurance for ADD-thanks  Follow-up by: Judith Part MD,  December 27, 2010 11:24 AM  Additional Follow-up for Phone Call Additional follow up Details #1::        Left message for patient to call back. Lewanda Rife LPN  December 27, 2010 2:42 PM    Advised pt.           Lowella Petties CMA, AAMA  December 27, 2010 3:22 PM

## 2011-01-23 ENCOUNTER — Telehealth: Payer: Self-pay | Admitting: *Deleted

## 2011-01-23 NOTE — Telephone Encounter (Signed)
Patient notified as instructed by telephone. 

## 2011-01-23 NOTE — Telephone Encounter (Signed)
That is fine - can call if she needs it

## 2011-01-23 NOTE — Telephone Encounter (Signed)
Pt had to stop taking adderall because it was making her feel very tired.  She is due to get new insurance on 4/01 that will cover vyvanse for her.   She has been off adderal for about a week and a half and feels bad, but she is trying to hold off on anything else until her insurance kicks in.  She has appt to see you on 4/17, but may call next week for new script for vyvanse.

## 2011-01-30 ENCOUNTER — Telehealth: Payer: Self-pay | Admitting: *Deleted

## 2011-01-30 MED ORDER — LISDEXAMFETAMINE DIMESYLATE 40 MG PO CAPS: 40.0000 mg | ORAL_CAPSULE | ORAL | Status: DC

## 2011-01-30 NOTE — Telephone Encounter (Signed)
Px printed for pick up in IN box  

## 2011-01-30 NOTE — Telephone Encounter (Signed)
Patient notified as instructed by telephone. Prescription left at front desk.  

## 2011-01-30 NOTE — Telephone Encounter (Signed)
Pt now has new insurance and she wants to start back on vyvanse.  She was on 40 mg's before and thinks that dose will be fine.  Please call her when script is ready.

## 2011-01-31 ENCOUNTER — Ambulatory Visit: Payer: No Typology Code available for payment source | Admitting: Psychology

## 2011-02-01 ENCOUNTER — Other Ambulatory Visit: Payer: Self-pay | Admitting: *Deleted

## 2011-02-01 ENCOUNTER — Ambulatory Visit: Payer: No Typology Code available for payment source | Admitting: Psychology

## 2011-02-01 NOTE — Telephone Encounter (Signed)
Opened in error

## 2011-02-02 ENCOUNTER — Other Ambulatory Visit: Payer: Self-pay | Admitting: Family Medicine

## 2011-02-02 ENCOUNTER — Encounter: Payer: Self-pay | Admitting: Family Medicine

## 2011-02-07 ENCOUNTER — Ambulatory Visit (INDEPENDENT_AMBULATORY_CARE_PROVIDER_SITE_OTHER): Payer: 59 | Admitting: Psychology

## 2011-02-07 DIAGNOSIS — F331 Major depressive disorder, recurrent, moderate: Secondary | ICD-10-CM

## 2011-02-07 LAB — PROTIME-INR: INR: 0.9 (ref 0.00–1.49)

## 2011-02-07 LAB — CBC
HCT: 39.8 % (ref 36.0–46.0)
Hemoglobin: 13.9 g/dL (ref 12.0–15.0)
RBC: 4.31 MIL/uL (ref 3.87–5.11)
WBC: 8.7 10*3/uL (ref 4.0–10.5)

## 2011-02-07 LAB — DIFFERENTIAL
Eosinophils Absolute: 0.2 10*3/uL (ref 0.0–0.7)
Eosinophils Relative: 2 % (ref 0–5)
Lymphs Abs: 2 10*3/uL (ref 0.7–4.0)
Monocytes Absolute: 0.5 10*3/uL (ref 0.1–1.0)
Monocytes Relative: 6 % (ref 3–12)

## 2011-02-07 LAB — TYPE AND SCREEN: ABO/RH(D): A NEG

## 2011-02-07 LAB — COMPREHENSIVE METABOLIC PANEL
ALT: 34 U/L (ref 0–35)
Alkaline Phosphatase: 73 U/L (ref 39–117)
BUN: 11 mg/dL (ref 6–23)
CO2: 29 mEq/L (ref 19–32)
Chloride: 100 mEq/L (ref 96–112)
Glucose, Bld: 97 mg/dL (ref 70–99)
Potassium: 4.2 mEq/L (ref 3.5–5.1)
Sodium: 137 mEq/L (ref 135–145)
Total Bilirubin: 0.9 mg/dL (ref 0.3–1.2)
Total Protein: 7 g/dL (ref 6.0–8.3)

## 2011-02-07 LAB — URINALYSIS, ROUTINE W REFLEX MICROSCOPIC
Glucose, UA: NEGATIVE mg/dL
Specific Gravity, Urine: 1.016 (ref 1.005–1.030)
Urobilinogen, UA: 0.2 mg/dL (ref 0.0–1.0)
pH: 5.5 (ref 5.0–8.0)

## 2011-02-07 LAB — URINE MICROSCOPIC-ADD ON

## 2011-02-08 ENCOUNTER — Telehealth: Payer: Self-pay | Admitting: Family Medicine

## 2011-02-08 DIAGNOSIS — Z0001 Encounter for general adult medical examination with abnormal findings: Secondary | ICD-10-CM | POA: Insufficient documentation

## 2011-02-08 DIAGNOSIS — Z0000 Encounter for general adult medical examination without abnormal findings: Secondary | ICD-10-CM

## 2011-02-08 NOTE — Telephone Encounter (Signed)
Message copied by Roxy Manns on Wed Feb 08, 2011 10:26 AM ------      Message from: Margarite Gouge, NATASHA      Created: Wed Feb 08, 2011  7:46 AM      Regarding: Cpx labs Fri       Please order  future cpx labs for pt's upcomming lab appt.      Thanks      Rodney Booze

## 2011-02-10 ENCOUNTER — Other Ambulatory Visit (INDEPENDENT_AMBULATORY_CARE_PROVIDER_SITE_OTHER): Payer: 59

## 2011-02-10 DIAGNOSIS — Z0000 Encounter for general adult medical examination without abnormal findings: Secondary | ICD-10-CM

## 2011-02-10 LAB — CBC WITH DIFFERENTIAL/PLATELET
Eosinophils Relative: 2.4 % (ref 0.0–5.0)
HCT: 39.8 % (ref 36.0–46.0)
Lymphocytes Relative: 23.8 % (ref 12.0–46.0)
Monocytes Relative: 6.9 % (ref 3.0–12.0)
Neutrophils Relative %: 66.5 % (ref 43.0–77.0)
Platelets: 294 10*3/uL (ref 150.0–400.0)
WBC: 6.4 10*3/uL (ref 4.5–10.5)

## 2011-02-10 LAB — LIPID PANEL
Cholesterol: 211 mg/dL — ABNORMAL HIGH (ref 0–200)
HDL: 52.2 mg/dL (ref >39.00)
VLDL: 19.8 mg/dL (ref 0.0–40.0)

## 2011-02-10 LAB — COMPREHENSIVE METABOLIC PANEL
Albumin: 3.5 g/dL (ref 3.5–5.2)
CO2: 26 mEq/L (ref 19–32)
GFR: 121.63 mL/min (ref >60.00)
Glucose, Bld: 101 mg/dL — ABNORMAL HIGH (ref 70–99)
Sodium: 138 mEq/L (ref 135–145)
Total Bilirubin: 0.3 mg/dL (ref 0.3–1.2)
Total Protein: 6.8 g/dL (ref 6.0–8.3)

## 2011-02-10 LAB — TSH: TSH: 1.58 u[IU]/mL (ref 0.35–5.50)

## 2011-02-14 ENCOUNTER — Ambulatory Visit (INDEPENDENT_AMBULATORY_CARE_PROVIDER_SITE_OTHER): Payer: 59 | Admitting: Family Medicine

## 2011-02-14 ENCOUNTER — Encounter: Payer: Self-pay | Admitting: Family Medicine

## 2011-02-14 ENCOUNTER — Other Ambulatory Visit (HOSPITAL_COMMUNITY)
Admission: RE | Admit: 2011-02-14 | Discharge: 2011-02-14 | Disposition: A | Discharge status: Home / Self Care | Payer: 59 | Source: Ambulatory Visit | Attending: Family Medicine | Admitting: Family Medicine

## 2011-02-14 DIAGNOSIS — Z1159 Encounter for screening for other viral diseases: Secondary | ICD-10-CM | POA: Insufficient documentation

## 2011-02-14 DIAGNOSIS — Z0000 Encounter for general adult medical examination without abnormal findings: Secondary | ICD-10-CM

## 2011-02-14 DIAGNOSIS — Z01419 Encounter for gynecological examination (general) (routine) without abnormal findings: Secondary | ICD-10-CM | POA: Insufficient documentation

## 2011-02-14 DIAGNOSIS — Z1231 Encounter for screening mammogram for malignant neoplasm of breast: Secondary | ICD-10-CM

## 2011-02-14 DIAGNOSIS — F329 Major depressive disorder, single episode, unspecified: Secondary | ICD-10-CM

## 2011-02-14 DIAGNOSIS — F988 Other specified behavioral and emotional disorders with onset usually occurring in childhood and adolescence: Secondary | ICD-10-CM

## 2011-02-14 DIAGNOSIS — I10 Essential (primary) hypertension: Secondary | ICD-10-CM

## 2011-02-14 MED ORDER — FLUTICASONE FUROATE 27.5 MCG/SPRAY NA SUSP: 2.0000 | Freq: Every day | NASAL | Status: DC

## 2011-02-14 MED ORDER — LISDEXAMFETAMINE DIMESYLATE 50 MG PO CAPS: 50.0000 mg | ORAL_CAPSULE | ORAL | Status: DC

## 2011-02-14 NOTE — Assessment & Plan Note (Signed)
Tolerates vyvanse well and can now afford it  However feels she needs inc dose to help with concentration Inc to 50 mg and will update  F/u 6 months

## 2011-02-14 NOTE — Patient Instructions (Signed)
We will schedule mammogram at check out  Keep working on weight loss Try vyvanse 50 and let me know how it works  Call back when ready to schedule your colonoscopy  Avoid red meat/ fried foods/ egg yolks/ fatty breakfast meats/ butter, cheese and high fat dairy/ and shellfish   Follow up in about 6 months

## 2011-02-14 NOTE — Assessment & Plan Note (Signed)
Mam scheduled Nl breast exam  Enc monthly self exams

## 2011-02-14 NOTE — Assessment & Plan Note (Signed)
Pt continues to see Dr Laymond Purser for counseling  If she feels depression worsen- will let me know

## 2011-02-14 NOTE — Assessment & Plan Note (Signed)
Reviewed health habits including diet and exercise and skin cancer prevention Also reviewed health mt list, fam hx and immunizations  Rev wellness labs in detail  Plan made for wt loss

## 2011-02-14 NOTE — Assessment & Plan Note (Signed)
Annual exam with pap  Menstrual problems are better with current pill- happy with that

## 2011-02-14 NOTE — Progress Notes (Signed)
Subjective:    Patient ID: Patricia [name], female    DOB: September 12, 1961, 50 y.o.   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  Works out of her home  She is seeing Dr Laymond Purser for mood    HTN well controlled with 106/74  Pap3/11 --normal  Menses are pretty nl with the pill -- lighter and not crazy like they were before  No abn pap smears    Mam 4/11 Self exam - no lumps or changes  Still has trouble with hydraadenitis occas- better overall  Uses bactroban occas   Colon screen-- wants to do colonosc later in the year -will call back to schedule  Td04  vyvanse for ADD-- getting back to normal with it now  Attention is not where she wants it -- 2.5 weeks  Wants to inc dose to 50 No problems with side eff like with the other stimulants   LDL is up to 140s Up from the 120s Has been off her healthy eating plan  Ready to get back on track Lab Results  Component Value Date   CHOL 211* 02/10/2011   CHOL 187 01/11/2010   CHOL 165 01/04/2009   Lab Results  Component Value Date   HDL 52.20 02/10/2011   HDL 56.21 01/11/2010   HDL 30.8 01/04/2009   Lab Results  Component Value Date   LDLCALC 115* 01/11/2010   LDLCALC 91 01/04/2009   LDLCALC 113* 12/25/2007   Lab Results  Component Value Date   TRIG 99.0 02/10/2011   TRIG 123.0 01/11/2010   TRIG 131 01/04/2009   Lab Results  Component Value Date   CHOLHDL 4 02/10/2011   CHOLHDL 4  01/11/2010   CHOLHDL 3.5 CALC 01/04/2009    No past medical history on file. No past surgical history on file.  reports that she has never smoked. She does not have any smokeless tobacco history on file. Her alcohol and drug histories not on file. family history is not on file. Allergies  Allergen Reactions  . Adderall     REACTION: not effective  . Bupropion Hcl     REACTION: irritability  . Celecoxib     REACTION: edema  . Fluticasone Propionate     REACTION: not effective  . Sertraline Hcl     REACTION: wt gain        Review of Systems  Constitutional: Positive for fatigue. Negative for chills, activity change and appetite change.  HENT: Negative for ear pain.   Eyes: Negative for pain.  Respiratory: Negative for cough and shortness of breath.   Cardiovascular: Negative.   Gastrointestinal: Negative for abdominal pain, diarrhea, constipation and blood in stool.  Genitourinary: Negative for frequency.  Musculoskeletal: Positive for back pain. Negative for myalgias.  Skin: Negative.  Neurological: Negative for light-headedness, numbness and headaches.  Hematological: Negative for adenopathy. Does not bruise/bleed easily.  Psychiatric/Behavioral: Positive for dysphoric mood. The patient is not nervous/anxious and is not hyperactive.        Objective:   Physical Exam  Constitutional: She appears well-developed and well-nourished.       overwt and well appearing   HENT:  Head: Normocephalic and atraumatic.  Left Ear: External ear normal.  Nose: Nose normal.  Mouth/Throat: Oropharynx is clear and moist.  Eyes: Conjunctivae are normal. Pupils are equal, round, and reactive to light.  Neck: Normal range of motion. Neck supple. No JVD present. Carotid bruit is not present. No thyromegaly present.  Cardiovascular: Normal rate, regular rhythm and normal heart sounds.   Pulmonary/Chest: Effort normal and breath sounds normal.  Abdominal: Soft. Bowel sounds are normal. She  exhibits no abdominal bruit. There is no tenderness.  Genitourinary: Vagina normal and uterus normal. No breast swelling, tenderness, discharge or bleeding. No vaginal discharge found.  Lymphadenopathy:    She has no cervical adenopathy.  Neurological: She is alert. She has normal reflexes. Coordination normal.  Skin: Skin is warm and dry. No rash noted. No erythema. No pallor.  Psychiatric: She has a normal mood and affect.          Assessment & Plan:

## 2011-02-14 NOTE — Assessment & Plan Note (Signed)
Good control currently - even on OC Will continue current med without change

## 2011-02-15 ENCOUNTER — Ambulatory Visit (INDEPENDENT_AMBULATORY_CARE_PROVIDER_SITE_OTHER): Payer: 59 | Admitting: Psychology

## 2011-02-15 DIAGNOSIS — F331 Major depressive disorder, recurrent, moderate: Secondary | ICD-10-CM

## 2011-02-16 ENCOUNTER — Encounter: Payer: Self-pay | Admitting: Family Medicine

## 2011-02-24 LAB — HM MAMMOGRAPHY: HM Mammogram: NORMAL

## 2011-02-28 ENCOUNTER — Other Ambulatory Visit: Payer: Self-pay | Admitting: *Deleted

## 2011-02-28 MED ORDER — LEVONORGEST-ETH ESTRAD 91-DAY 0.15-0.03 &0.01 MG PO TABS: ORAL_TABLET | ORAL | Status: DC

## 2011-03-01 ENCOUNTER — Ambulatory Visit
Admission: RE | Admit: 2011-03-01 | Discharge: 2011-03-01 | Disposition: A | Discharge status: Home / Self Care | Payer: 59 | Source: Ambulatory Visit | Attending: Family Medicine | Admitting: Family Medicine

## 2011-03-01 DIAGNOSIS — Z1231 Encounter for screening mammogram for malignant neoplasm of breast: Secondary | ICD-10-CM

## 2011-03-07 ENCOUNTER — Encounter: Payer: Self-pay | Admitting: *Deleted

## 2011-03-08 ENCOUNTER — Ambulatory Visit: Payer: 59 | Admitting: Psychology

## 2011-03-13 ENCOUNTER — Other Ambulatory Visit: Payer: Self-pay | Admitting: *Deleted

## 2011-03-13 MED ORDER — LISDEXAMFETAMINE DIMESYLATE 60 MG PO CAPS: 60.0000 mg | ORAL_CAPSULE | ORAL | Status: DC

## 2011-03-13 NOTE — Telephone Encounter (Signed)
Left message for pt to call back.Prescription left at front desk.  

## 2011-03-13 NOTE — Telephone Encounter (Signed)
Advised pt script is ready for pick up. 

## 2011-03-13 NOTE — Telephone Encounter (Signed)
Pt needs a refill on vyvanse but she wants to increase the dose to 60 mg's. She says she has not noticed a change with the 50 mg dose.  Please call when ready.

## 2011-03-13 NOTE — Telephone Encounter (Signed)
Px printed for pick up in IN box  

## 2011-03-14 NOTE — H&P (Signed)
NAMEJACQUELI, [name] NO.:  [id number]   MEDICAL RECORD NO.:  [id number]          PATIENT TYPE:  INP   LOCATION:  3034                         FACILITY:  [facility]   PHYSICIAN:  Payton Doughty, M.D.      DATE OF BIRTH:  04/03/1961   DATE OF ADMISSION:  03/30/2009  DATE OF DISCHARGE:                              HISTORY & PHYSICAL   ADMISSION DIAGNOSIS:  Spondylosis L3-4.   BODY OF TEXT:  This is 50 year old right-handed white girl who I have  operated on in the past.  She is fused from L4-S1.  She has been having  pain in her back as well and has some shooting pains in her lower  extremities.  She came to the office and on MR had some stenosis and  spondylosis at L3-4 and is admitted now for decompression.  Her medical  history is in general pretty good.  She has reflux and had some  endoscopy.   She is on lisinopril, verapamil, Protonix, and hydrocodone.   She has no allergies.   SURGICAL HISTORY:  L4-S1 fusion.  She has also had an ACL and a C-  section.   SOCIAL HISTORY:  She still does not smoke, drinks socially.  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   FAMILY HISTORY:  Mother is 65, in good health and aortic aneurysm.  Father died of esophageal cancer.   REVIEW OF SYSTEMS:  Remarkable for weight loss, wearing glasses, sore  throat, gastritis, back pain, arm pain, leg pain, neck pain, and  depression which she says is improving.   PHYSICAL EXAMINATION:  HEENT:  Normal limits.  She has pain in the upper  part of her neck with head movement.  CHEST:  Clear.  CARDIAC:  Regular rate and rhythm.  ABDOMEN:  Nontender and no hepatosplenomegaly.  EXTREMITIES:  No clubbing or cyanosis.  GU:  Deferred.  Peripheral pulses are good.  NEUROLOGIC:  She is awake, alert, and oriented.  Cranial nerves are  intact.  Motor exam shows 5/5 strength throughout the upper and lower  extremities.  Sensory dysesthesias described in an L4 distribution.  Reflexes are  1 at the ankles, absent at the knees.   MR of the C-spine was alright.  She had facet arthropathy at L2-3 with  edema in the facet joint.  The lumbar spine shows severe stenosis at L3-  4, with significant degeneration of the L3-4 disk and the facet joints  are slightly open and edematous.   CLINICAL IMPRESSION:  Adjacent segment disease at L3-4.   PLAN:  The plan is for an anterolateral interbody fusion.  The risks and  benefits have been discussed with her and she wishes proceed.           ______________________________  Payton Doughty, M.D.     MWR/MEDQ  D:  03/30/2009  T:  03/31/2009  Job:  [id number]

## 2011-03-14 NOTE — Assessment & Plan Note (Signed)
[facility]                                 ON-CALL NOTE   NAME:LYNCHChanae, Gemma                         MRN:          [id number]  DATE:10/13/2007                            DOB:          04-Aug-1961    TELEPHONE NUMBER:  [phone]   PRIMARY CARE PHYSICIAN:  Dr. Milinda Antis.   This patient has had a 3 to 4 week history of migrating abdominal pain.  The patient is calling for advice. She has had a workup by Dr. Milinda Antis and  the etiology has not been discovered. The pain is not any different than  it was when she saw Dr. Milinda Antis last week. I advised that if she could  wait to see Dr. Milinda Antis in the morning and to call and make an  appointment. If not, she could come to the [facility] emergency  room and be seen for evaluation today.     Jeffrey A. Tawanna Cooler, MD  Electronically Signed    JAT/MedQ  DD: 10/13/2007  DT: 10/14/2007  Job #: [id number]

## 2011-03-14 NOTE — Op Note (Signed)
Patricia [name], Patricia [name] NO.:  [id number]   MEDICAL RECORD NO.:  [id number]          PATIENT TYPE:  INP   LOCATION:  3034                         FACILITY:  [facility]   PHYSICIAN:  Payton Doughty, M.D.      DATE OF BIRTH:  26-Aug-1961   DATE OF PROCEDURE:  03/30/2009  DATE OF DISCHARGE:                               OPERATIVE REPORT   PREOPERATIVE DIAGNOSIS:  Spondylosis, L3-4.   POSTOPERATIVE DIAGNOSIS:  Spondylosis, L3-4.   OPERATIVE PROCEDURE:  L3-4 anterolateral interbody fusion using the XLIF  technique with a lateral plate.   SURGEON:  Payton Doughty, MD, Neurosurgery.   ANESTHESIA:  General endotracheal.   PREPARATION:  Prepped and draped with alcohol wipe.   COMPLICATIONS:  None.   ASSISTANT:  Clydene Fake, MD   DESCRIPTION:  This is a 50 year old woman with severe spondylosis,  transitional segment disease at L3-4.  She was taken to the operating  room, smoothly anesthetized, intubated, and placed right side down and  left side up lateral decubitus position.  She was secured to the table,  set up for EMG monitoring.  The table flexed allow access to the L3-4  interspace from the lateral approach.  Following shave, prep, and drape  in usual sterile fashion, skin was incised.  Straight lateral of the L3-  4 disk space with a distance of approximately 4 cm and then below that  about a 2-cm incision was made to allow access to the retroperitoneal  space.  A finger was inserted in the posterior incision and the  retroperitoneal space was accessed.  Working through the lateral  incision with the finger as a guide in the retroperitoneal space,  sequential electrode dilators were advanced over a K-wire that allowed  EMG monitoring and testing to make sure that no nerves or brachial  plexus were injured.  Advancing in size, the retractor was placed,  opened slightly, and secured in position.  Monitoring around it with a  freehand electrode did not produce any EMG  disturbances.  The shim was  then inserted.  Lights were placed and the retractor fully opened.  Diskectomy was carried out and contralateral release was obtained.  The  sizers were then tried and it was found out that a 12-mm interbody  device was appropriate size, a 12 x 55-mm interbody device was placed.  It was packed with osseous cell.  65-mm screws were then placed across  the superior and inferior endplates and then a 12-mm plate was placed  across them and tightened down securely.  Final films showed good  placement of all hardware.  Successive layers of 0 Vicryl, 2-0 Vicryl,  and 3-0 nylon were used to close.  Betadine and Telfa dressing was  applied, made occlusive with OpSite, and the patient returned to  recovery room in good condition.          ______________________________  Payton Doughty, M.D.    MWR/MEDQ  D:  03/30/2009  T:  03/31/2009  Job:  [id number]

## 2011-03-14 NOTE — Assessment & Plan Note (Signed)
[facility] OFFICE NOTE   NAME:[name], KYNA [name]                       MRN:          [id number]  DATE:12/16/2007                            DOB:          [birth year]    Patricia [name] is a delightful 50 year old separated white female who  comes today with chest discomfort.  We were asked to consult on her by  Dr. Milinda Antis.   Her cardiac risk factors include sedentary lifestyle, obesity, which she  has struggled with for years, and now hypertension.  Her  hydrochlorothiazide was just increased to 50 mg a day, but her pressures  that she brings today are suboptimal.  Most of them running around 145  to 150 systolic over about 90 diastolic.   She does not smoke.  She does not have hyperlipidemia.  She does drink  anywhere from 0 to 6 beers per day.  She does not exercise.   FAMILY HISTORY:  Is negative premature coronary disease.  Diabetes does  run and family.   PAST MEDICAL HISTORY:  She has multiple drug allergies including  CELEBREX, FLONASE, WELLBUTRIN and ZOLOFT.  She says that LISINOPRIL  caused abdominal cramps, which I find to be extraordinarily unusual.  Unfortunately, the LISINOPRIL controlled her blood pressure beautifully.  She has no dye allergies.   CURRENT MEDS:  Nexium 40 mg and hydrochlorothiazide 50 mg a day,  Veramyst 27.5 mcg spray, BuSpar 7.5 mg p.o. b.i.d.   SURGICAL HISTORY:  She had an ACL replacement in the right knee in 2003,  C section 2005, fusion of L4, L5-S1 in 2007.   SOCIAL HISTORY:  She is separated.  She has gone through a very  difficult divorce process.  She has 1 child.  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  She does mostly desk work and computer work.   She does not smoke.   REVIEW OF SYSTEMS:  Other than HPI, she had a history of some allergies,  history of anxiety and depression, some gastroesophageal reflux.  Specifically, her chest discomfort described as  mid sternal, not  associated with activity, well localized and not consistent with angina.   EXAM:  Very pleasant.  Her blood pressure 134/100, pulse 88 and regular.  Weight is 229.  She  is 5 feet 5 inches.  HEENT:  Normocephalic, atraumatic.  PERRL.  Extraocular intact.  Sclerae  are clear.  Face symmetry is normal.  Carotids are equal bilateral bruits, no JVD.  Thyroid is not enlarged.  Trachea is midline.  Neck is supple.  LUNGS:  Clear to auscultation.  HEART:  Reveals a poorly appreciated PMI normal S1-S2.  No gallop.  ABDOMEN:  Soft, good bowel sounds.  She is obese.  EXTREMITIES:  No edema.  Pulses are intact.  There is minimal edema.  Neuro exam is intact.   EKG is normal.   ASSESSMENT:  1. Noncardiac chest pain.  2. Suboptimal control of blood pressure.  I doubt she is intolerant of      LISINOPRIL as mentioned above.  It did seem to  control pressure      along with hydrochlorothiazide.  3. Sedentary lifestyle.  4. Obesity.  5. Family history of diabetes.   PLAN:  1. I have had a long talk with Patricia [name] today.  She clearly needs to      get her blood pressure under control with the addition of a second      agent.  I will leave this to Dr. Royden Purl expertise.  I would      certainly try lisinopril again.  2. Walk 3 hours per week.  3. Weight reduction.  I will see her back on p.r.n. basis.     Thomas C. Daleen Squibb, MD, [facility]  Electronically Signed    TCW/MedQ  DD: 12/16/2007  DT: 12/16/2007  Job #: [id number]   cc:   [name], MD

## 2011-03-16 ENCOUNTER — Other Ambulatory Visit: Payer: Self-pay | Admitting: Family Medicine

## 2011-03-22 ENCOUNTER — Ambulatory Visit: Payer: 59 | Admitting: Psychology

## 2011-04-11 ENCOUNTER — Other Ambulatory Visit: Payer: Self-pay | Admitting: *Deleted

## 2011-04-11 MED ORDER — LISDEXAMFETAMINE DIMESYLATE 60 MG PO CAPS: 60.0000 mg | ORAL_CAPSULE | ORAL | Status: DC

## 2011-04-11 NOTE — Telephone Encounter (Signed)
Please call pt when ready.

## 2011-04-11 NOTE — Telephone Encounter (Signed)
Px printed for pick up in IN box  

## 2011-04-11 NOTE — Telephone Encounter (Signed)
Patient notified as instructed by telephone. Prescription left at front desk.  

## 2011-04-20 ENCOUNTER — Encounter: Payer: Self-pay | Admitting: Cardiovascular Disease

## 2011-05-16 ENCOUNTER — Telehealth: Payer: Self-pay | Admitting: *Deleted

## 2011-05-16 ENCOUNTER — Telehealth: Payer: Self-pay | Admitting: Family Medicine

## 2011-05-16 MED ORDER — LISDEXAMFETAMINE DIMESYLATE 60 MG PO CAPS: 60.0000 mg | ORAL_CAPSULE | ORAL | Status: DC

## 2011-05-16 NOTE — Telephone Encounter (Signed)
I spoke with Patricia [name] at [facility] and pt got Vyvanse 60 mg taking 1 tablet by mouth daily and last refilled on 04/12/11.

## 2011-05-16 NOTE — Telephone Encounter (Signed)
Patient notified as instructed by telephone. Prescription left at front desk.  

## 2011-05-16 NOTE — Telephone Encounter (Signed)
I cannot access centricity right now -- what is dose and frequency- please ask pt or pharmacy I am aware she is still on it --thanks

## 2011-05-16 NOTE — Telephone Encounter (Signed)
Pt is requesting refill on vyvanse, this is no longer on med list.  Please call when ready.

## 2011-05-16 NOTE — Telephone Encounter (Signed)
Px printed for pick up in IN box  

## 2011-05-18 ENCOUNTER — Telehealth: Payer: Self-pay | Admitting: *Deleted

## 2011-05-18 MED ORDER — SCOPOLAMINE 1 MG/3DAYS TD PT72: 1.0000 | MEDICATED_PATCH | TRANSDERMAL | Status: DC

## 2011-05-18 NOTE — Telephone Encounter (Signed)
Patient notified as instructed by telephone. 

## 2011-05-18 NOTE — Telephone Encounter (Signed)
Patient is going deep sea fishing, will be leaving next week. She is asking for motion sickness patches. Uses [facility] [address]

## 2011-05-18 NOTE — Telephone Encounter (Signed)
Will send electronically Tell her to be careful of sedation

## 2011-06-07 ENCOUNTER — Other Ambulatory Visit: Payer: Self-pay | Admitting: *Deleted

## 2011-06-07 MED ORDER — LISDEXAMFETAMINE DIMESYLATE 60 MG PO CAPS: 60.0000 mg | ORAL_CAPSULE | ORAL | Status: DC

## 2011-06-07 NOTE — Telephone Encounter (Signed)
Had to reprint rx due to the copy machine being out of prescription paper. Initial rx on plain paper shredded.

## 2011-06-07 NOTE — Telephone Encounter (Signed)
Patient notified as instructed by telephone. Prescription left at front desk.  

## 2011-06-07 NOTE — Telephone Encounter (Signed)
That is ok with me but she will need to tell the pharmacist that so he/she will fill it Px printed for pick up in IN box

## 2011-06-07 NOTE — Telephone Encounter (Signed)
Patient is asking if she can go ahead and get this refilled even though it is a week early. She is leaving to go out of town and will be gone all of next week and will run out of medication before she gets back. Please advise.

## 2011-06-12 ENCOUNTER — Telehealth: Payer: Self-pay | Admitting: *Deleted

## 2011-06-12 NOTE — Telephone Encounter (Signed)
Pharmacy called asking if ok to fill pt's vyvanse script on 8/15 because she is going on a cruise.  Should be filled on 8/19.  Advised ok, will have to wait until 9/19 for next refill.

## 2011-06-12 NOTE — Telephone Encounter (Signed)
That is fine - let me know if she needs the px (I think I did it on the 8th? )

## 2011-07-13 ENCOUNTER — Other Ambulatory Visit: Payer: Self-pay | Admitting: *Deleted

## 2011-07-13 NOTE — Telephone Encounter (Signed)
Please call pt when ready, I advised her you are out of office today.

## 2011-07-14 MED ORDER — LISDEXAMFETAMINE DIMESYLATE 60 MG PO CAPS: 60.0000 mg | ORAL_CAPSULE | ORAL | Status: DC

## 2011-07-14 NOTE — Telephone Encounter (Signed)
Px printed for pick up in IN box  

## 2011-07-14 NOTE — Telephone Encounter (Signed)
Patient advised.

## 2011-07-14 NOTE — Telephone Encounter (Signed)
Left message for pt to call back.Prescription left at front desk.  

## 2011-08-14 ENCOUNTER — Other Ambulatory Visit: Payer: Self-pay | Admitting: *Deleted

## 2011-08-14 MED ORDER — LISDEXAMFETAMINE DIMESYLATE 60 MG PO CAPS: 60.0000 mg | ORAL_CAPSULE | ORAL | Status: DC

## 2011-08-14 NOTE — Telephone Encounter (Signed)
Please call patient when script is ready.

## 2011-08-14 NOTE — Telephone Encounter (Signed)
Px printed for pick up in IN box  

## 2011-08-15 NOTE — Telephone Encounter (Signed)
Advised pt script is ready for pick up.

## 2011-08-16 ENCOUNTER — Ambulatory Visit: Payer: 59 | Admitting: Family Medicine

## 2011-08-29 ENCOUNTER — Ambulatory Visit (INDEPENDENT_AMBULATORY_CARE_PROVIDER_SITE_OTHER): Payer: 59 | Admitting: Family Medicine

## 2011-08-29 ENCOUNTER — Encounter: Payer: Self-pay | Admitting: Family Medicine

## 2011-08-29 VITALS — BP 124/82 | HR 96 | Temp 98.7°F | Ht 64.5 in | Wt 216.8 lb

## 2011-08-29 DIAGNOSIS — F988 Other specified behavioral and emotional disorders with onset usually occurring in childhood and adolescence: Secondary | ICD-10-CM

## 2011-08-29 DIAGNOSIS — F329 Major depressive disorder, single episode, unspecified: Secondary | ICD-10-CM

## 2011-08-29 DIAGNOSIS — L989 Disorder of the skin and subcutaneous tissue, unspecified: Secondary | ICD-10-CM

## 2011-08-29 DIAGNOSIS — R490 Dysphonia: Secondary | ICD-10-CM

## 2011-08-29 DIAGNOSIS — I10 Essential (primary) hypertension: Secondary | ICD-10-CM

## 2011-08-29 DIAGNOSIS — K219 Gastro-esophageal reflux disease without esophagitis: Secondary | ICD-10-CM | POA: Insufficient documentation

## 2011-08-29 NOTE — Patient Instructions (Signed)
Use cort aid on spot on face daily for 2 weeks - then call and update me with whether it is still there Call your ins co and ask which long phase oral contraceptive is covered best and let me know At any time if you want to stop it and check labs for menopause- let me know  We will refer you to ENT for eval of hoarsness We will refer you to psychiatry

## 2011-08-29 NOTE — Progress Notes (Signed)
Subjective:    Patient ID: Patricia [name], female    DOB: [birth year], 50 y.o.   MRN: [id number]  HPI Here for f/u of HTN and ADD and depression  bp continues to be well controlled with 124/82 today Thought it was high last week -- ? At a drugstore 140/80- was rushing and stressed  No cp or palpitations or ha No change in med or side eff  Wt is down 5 lb with bmi of 36 ( lost 13 this summer -- up from bloating now ) Health habits-- not done good since cruise   ADD- on vyvanse at higher dose Is ok but does not last long enough -- 12-12:30  Also having trouble sleeping   Depression- did go to counseling- that was a little helpful  Has had for a long time - as far as she can remember  Loss of motivation to do anything  Feels not fully engaged in her life and procrastinates  Getting out of marriage helped  Has had a rough time with medications-- she is intolerant / low sex drive /gained wt   Has a spot on face - wants to check   Is having trouble with singing-- voice is not in good shape  On GERD med  No heartburn   Now working out of her house -- less walking  Not doing any exercise  Wants to move so she has room for exercise equipt    seasonique (camrese) - can not afford  ? Other option  Had a facet joint injection in feb -- does not know if she got a tainted bottle ? Got a refund in the mail for small amt for visit- but was not told she had a tainted bottle  Wants to know if she is menopausal  Some spotting after one placebo   Patient Active Problem List  Diagnoses  . OBESITY  . ANXIETY  . DEPRESSION  . ATTENTION DEFICIT DISORDER  . ESSENTIAL HYPERTENSION  . SINUSITIS, CHRONIC  . ALLERGIC RHINITIS  . GERD  . EXCESSIVE OR FREQUENT MENSTRUATION  . IRREGULAR MENSTRUAL CYCLE  . ROSACEA  . HIDRADENITIS SUPPURATIVA  . DEGENERATIVE DISC DISEASE  . TRANSAMINASES, SERUM, ELEVATED  . Routine general medical examination at a health care facility  . Gynecologic exam  normal  . Other screening mammogram  . Skin lesion  . Hoarseness   Past Medical History  Diagnosis Date  . Hoarseness   . Chest pain, unspecified   . Neck pain   . Obesity   . Excessive and frequent menstruation   . Hidradenitis suppurativa   . Irregular menstrual cycle   . Nonspecific elevation of levels of transaminase or lactic acid dehydrogenase (LDH)   . Elevated white blood cell count   . Hypertension   . Rosacea   . Degenerative disc disease   . Chronic sinusitis   . GERD (gastroesophageal reflux disease)   . Depression   . Anxiety   . Allergy     allergic rhinitis  . ADD (attention deficit disorder)    Past Surgical History  Procedure Date  . Cesarean section   . Spine surgery     spinal fusion L4 L5 S1  . Tonsillectomy   . Anterior cruciate ligament repair   . Pilonidal cyst drainage    History  Substance Use Topics  . Smoking status: Never Smoker   . Smokeless tobacco: Not on file  . Alcohol Use: Yes   Family History  Problem Relation  Age of Onset  . Aneurysm Mother     2 abdominal aneurysms  . Cancer Father     esophageal CA  . Hypertension Sister   . Obesity Sister   . Hypertension Brother   . ADD / ADHD Son   . Cancer Maternal Aunt     breast CA   Allergies  Allergen Reactions  . Adderall     REACTION: not effective  . Bupropion Hcl     REACTION: irritability  . Celecoxib     REACTION: edema  . Fluticasone Propionate     REACTION: not effective  . Sertraline Hcl     REACTION: wt gain   Current Outpatient Prescriptions on File Prior to Visit  Medication Sig Dispense Refill  . fluticasone (VERAMYST) 27.5 MCG/SPRAY nasal spray 2 sprays by Nasal route daily.  10 g  5  . Levonorgest-Eth Estrad 91-Day ([name]) 0.15-0.03 &0.01 MG TABS Take by mouth as directed.  91 each  3  . lisdexamfetamine (VYVANSE) 60 MG capsule Take 1 capsule (60 mg total) by mouth every morning.  30 capsule  0  . lisinopril-hydrochlorothiazide (PRINZIDE,ZESTORETIC)  20-12.5 MG per tablet 1 BY MOUTH ONCE DAILY  90 tablet  3  . mupirocin (BACTROBAN) 2 % ointment Apply topically 2 (two) times daily.        . pantoprazole (PROTONIX) 40 MG tablet 1 BY MOUTH TWO TIMES A DAY 30 MINUTES BEFORE BREAKFAST AND SUPPER  180 tablet  2  . HYDROcodone-acetaminophen (NORCO) 10-325 MG per tablet 1-2 tablets every 8 hours as needed for pain      . scopolamine (TRANSDERM-SCOP) 1.5 MG Place 1 patch (1.5 mg total) onto the skin every 3 (three) days.  4 patch  0       Review of Systems Review of Systems  Constitutional: Negative for fever, appetite change, and unexpected weight change. (having trouble loosing wt ) Eyes: Negative for pain and visual disturbance.  Respiratory: Negative for cough and shortness of breath.   Cardiovascular: Negative for cp or palpitations    Gastrointestinal: Negative for nausea, diarrhea and constipation.  Genitourinary: Negative for urgency and frequency.  Skin: Negative for pallor or rash   Neurological: Negative for weakness, light-headedness, numbness and headaches.  Hematological: Negative for adenopathy. Does not bruise/bleed easily.  Psychiatric/Behavioral: pos for depression and anx and problems focusing         Objective:   Physical Exam  Constitutional: She appears well-developed and well-nourished. No distress.       overwt and well appearing   HENT:  Head: Normocephalic and atraumatic.  Mouth/Throat: Oropharynx is clear and moist.  Eyes: Conjunctivae and EOM are normal. Pupils are equal, round, and reactive to light. No scleral icterus.  Neck: Normal range of motion. Neck supple. No JVD present. No thyromegaly present.  Cardiovascular: Normal rate, regular rhythm, normal heart sounds and intact distal pulses.  Exam reveals no gallop.   Pulmonary/Chest: Effort normal and breath sounds normal. No respiratory distress. She has no wheezes.  Abdominal: Soft. Bowel sounds are normal. She exhibits no distension and no mass. There  is no tenderness.  Musculoskeletal: Normal range of motion. She exhibits no edema and no tenderness.  Lymphadenopathy:    She has no cervical adenopathy.  Neurological: She is alert. She has normal reflexes. No cranial nerve deficit. Coordination normal.  Skin: Skin is warm and dry. No rash noted. No erythema. No pallor.       Skin spot on R face is dry/  slt erythema   Psychiatric:       Pt seems generally irritable and quite unhappy Nl eye contact and comm skills Not tearful No SI          Assessment & Plan:

## 2011-08-31 NOTE — Assessment & Plan Note (Signed)
Area of erythema and scale - mild  ? Dermatitis vs evolving AK Will tx with otc cortisone - and if no imp in 2 weeks will update me for tx

## 2011-08-31 NOTE — Assessment & Plan Note (Signed)
Worsening with failure of many meds in past and no new stressors Will ref to psychiatry at this time No SI

## 2011-08-31 NOTE — Assessment & Plan Note (Signed)
vyvanse is working fairly- with more depression lately , however Is having more depression  No side eff No change in tx

## 2011-08-31 NOTE — Assessment & Plan Note (Signed)
Fairly controlled without change No change in tx Enc good diet and exercise

## 2011-08-31 NOTE — Assessment & Plan Note (Signed)
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

## 2011-09-11 ENCOUNTER — Other Ambulatory Visit: Payer: Self-pay | Admitting: *Deleted

## 2011-09-11 NOTE — Telephone Encounter (Signed)
Please call patient when rx is ready for pickup. Patient is aware that Dr. Milinda Antis is out of the office today and states that she has 4 pills left.

## 2011-09-12 MED ORDER — LISDEXAMFETAMINE DIMESYLATE 60 MG PO CAPS: 60.0000 mg | ORAL_CAPSULE | ORAL | Status: DC

## 2011-09-12 NOTE — Telephone Encounter (Signed)
Patient advised as instructed via telephone, Rx ready for pick up will be left at front desk. 

## 2011-09-12 NOTE — Telephone Encounter (Signed)
Px printed for pick up in IN box  

## 2011-09-13 ENCOUNTER — Ambulatory Visit: Payer: Self-pay | Admitting: Anesthesiology

## 2011-09-20 ENCOUNTER — Ambulatory Visit: Payer: Self-pay | Admitting: Otolaryngology

## 2011-09-25 LAB — PATHOLOGY REPORT

## 2011-09-28 ENCOUNTER — Telehealth: Payer: Self-pay

## 2011-09-28 NOTE — Telephone Encounter (Signed)
Make sure she is taking the protonix bid -I think she is  In addition to that she can add zantac 150 bid otc  The abx may be upsetting stomach  F/u when able  Go to ER if CP or persistent heart racing/ palpitation

## 2011-09-28 NOTE — Telephone Encounter (Signed)
Pt had ENT procedure by Dr Willeen Cass and has appt to f/u with Dr Willeen Cass on Mon.  Pt is having a lot of heart burn; no abdominal pain,no fever, no SOB. Pantoprazole isnot helping. Pt had to take Augmentin and Zpak and wonders if that effects how Pantoprazole works. Pt has sore place on rt upper chest and pt is doing a lot of burping when eating. Tried  Tums yesterday did not help. Pt started Viibryd on Tues. Pt was already having heart burn prior to starting Viibryd.Wants Dr Lucretia Roers opinion of what to do or should pt be seen Pt said occasional heart speeds up for a few seconds. Then heart rate  Goes back to normal.Pt can be reached at [phone].[facility] uses [facility] [address]. Pt aware Dr Milinda Antis is out of office and said tomorrow is Ok to hear back. Advised pt if chest pain or fast heart beat tonight to go to ER. Pt also noted she is leaving Monday for [facility] for 1 week.

## 2011-09-28 NOTE — Telephone Encounter (Signed)
Patient notified as instructed by telephone. 

## 2011-09-29 ENCOUNTER — Telehealth: Payer: Self-pay

## 2011-09-29 NOTE — Telephone Encounter (Signed)
Ok - in agreement, thanks

## 2011-09-29 NOTE — Telephone Encounter (Signed)
Pt said Patricia [name] has not really helped. Pt said today all she has eaten is chicken noodle soup with crackers and is not hurting right now. Pt wants Dr Milinda Antis to check her. Pt given appt 10/02/11 at 12:00noon and pt also given same instructions if chest pain or sob or heart racing prior to appt pt to go to ER. Pt acknowledged and said she was feeling better today than yesterday.

## 2011-10-02 ENCOUNTER — Ambulatory Visit: Payer: 59 | Admitting: Family Medicine

## 2011-10-02 ENCOUNTER — Telehealth: Payer: Self-pay | Admitting: Internal Medicine

## 2011-10-06 NOTE — Telephone Encounter (Signed)
Error

## 2011-10-18 ENCOUNTER — Telehealth: Payer: Self-pay | Admitting: Family Medicine

## 2011-10-18 MED ORDER — LISDEXAMFETAMINE DIMESYLATE 60 MG PO CAPS: 60.0000 mg | ORAL_CAPSULE | ORAL | Status: DC

## 2011-10-18 NOTE — Telephone Encounter (Signed)
Added Zantac to Pantoprozole for heartburn and reflux.  It did get better after that but needs to schedule appointment after Christmas to discuss.  Also wanted to know if there is something she can take or get a refill over the holidays to help out.  Her call back is [phone]

## 2011-10-18 NOTE — Telephone Encounter (Signed)
Patient notified as instructed by telephone. Prescription left at front desk.  

## 2011-10-18 NOTE — Telephone Encounter (Signed)
Px printed for pick up in IN box  

## 2011-10-18 NOTE — Telephone Encounter (Signed)
Spoke with pt and she scheduled appt with Dr Milinda Antis 10/27/11 at 12noon. Pt said she will just continue taking present med and if have problem will call back before her appt 10/27/11. Pt does need written rx for Vyvanse 60 mg to be picked up on Thur 10/19/11.Pt will be out of med Thur.Vyvanse had been removed from med list but I added back. Pt can be reached at [phone] when rx ready for pick up.

## 2011-10-27 ENCOUNTER — Encounter: Payer: Self-pay | Admitting: Family Medicine

## 2011-10-27 ENCOUNTER — Ambulatory Visit (INDEPENDENT_AMBULATORY_CARE_PROVIDER_SITE_OTHER): Payer: 59 | Admitting: Family Medicine

## 2011-10-27 VITALS — BP 120/76 | HR 88 | Temp 98.6°F | Ht 64.5 in | Wt 217.5 lb

## 2011-10-27 DIAGNOSIS — K219 Gastro-esophageal reflux disease without esophagitis: Secondary | ICD-10-CM

## 2011-10-27 NOTE — Patient Instructions (Signed)
Continue current medicines Get Align - probiotic over the counter- take as directed  We will refer you to GI at check out  Update me if new symptoms or changes

## 2011-10-27 NOTE — Progress Notes (Signed)
Subjective:    Patient ID: Patricia [name], female    DOB: 1961-08-08, 50 y.o.   MRN: [id number]  HPI Here for heartburn and reflux flare  Is on protonix bid and zantac at night   This all started after she took augmentin in November  Also a lot stress related issues going on  Neck surgery- and also had to take zpack  Was not on any steroids  No ibuprofen in past few days  Is still in a lot of back and neck pain   Had EGD in 2010 with gerd and also gastritis   No epigastric pain - just heartburn   Wt is the same   Patient Active Problem List  Diagnoses  . OBESITY  . ANXIETY  . DEPRESSION  . ATTENTION DEFICIT DISORDER  . ESSENTIAL HYPERTENSION  . SINUSITIS, CHRONIC  . ALLERGIC RHINITIS  . GERD  . EXCESSIVE OR FREQUENT MENSTRUATION  . IRREGULAR MENSTRUAL CYCLE  . ROSACEA  . HIDRADENITIS SUPPURATIVA  . DEGENERATIVE DISC DISEASE  . TRANSAMINASES, SERUM, ELEVATED  . Routine general medical examination at a health care facility  . Gynecologic exam normal  . Other screening mammogram  . Skin lesion  . Hoarseness   Past Medical History  Diagnosis Date  . Hoarseness   . Chest pain, unspecified   . Neck pain   . Obesity   . Excessive and frequent menstruation   . Hidradenitis suppurativa   . Irregular menstrual cycle   . Nonspecific elevation of levels of transaminase or lactic acid dehydrogenase (LDH)   . Elevated white blood cell count   . Hypertension   . Rosacea   . Degenerative disc disease   . Chronic sinusitis   . GERD (gastroesophageal reflux disease)   . Depression   . Anxiety   . Allergy     allergic rhinitis  . ADD (attention deficit disorder)    Past Surgical History  Procedure Date  . Cesarean section   . Spine surgery     spinal fusion L4 L5 S1  . Tonsillectomy   . Anterior cruciate ligament repair   . Pilonidal cyst drainage    History  Substance Use Topics  . Smoking status: Never Smoker   . Smokeless tobacco: Not on file  . Alcohol  Use: Yes   Family History  Problem Relation Age of Onset  . Aneurysm Mother     2 abdominal aneurysms  . Cancer Father     esophageal CA  . Hypertension Sister   . Obesity Sister   . Hypertension Brother   . ADD / ADHD Son   . Cancer Maternal Aunt     breast CA   Allergies  Allergen Reactions  . Adderall     REACTION: not effective  . Bupropion Hcl     REACTION: irritability  . Celecoxib     REACTION: edema  . Fluticasone Propionate     REACTION: not effective  . Sertraline Hcl     REACTION: wt gain   Current Outpatient Prescriptions on File Prior to Visit  Medication Sig Dispense Refill  . fluticasone (VERAMYST) 27.5 MCG/SPRAY nasal spray 2 sprays by Nasal route daily.  10 g  5  . Levonorgest-Eth Estrad 91-Day ([name]) 0.15-0.03 &0.01 MG TABS Take by mouth as directed.  91 each  3  . lisdexamfetamine (VYVANSE) 60 MG capsule Take 1 capsule (60 mg total) by mouth every morning.  30 capsule  0  . lisinopril-hydrochlorothiazide (PRINZIDE,ZESTORETIC) 20-12.5 MG per  tablet 1 BY MOUTH ONCE DAILY  90 tablet  3  . mupirocin (BACTROBAN) 2 % ointment Apply topically 2 (two) times daily.        . pantoprazole (PROTONIX) 40 MG tablet 1 BY MOUTH TWO TIMES A DAY 30 MINUTES BEFORE BREAKFAST AND SUPPER  180 tablet  2  . HYDROcodone-acetaminophen (NORCO) 10-325 MG per tablet 1-2 tablets every 8 hours as needed for pain      . scopolamine (TRANSDERM-SCOP) 1.5 MG Place 1 patch (1.5 mg total) onto the skin every 3 (three) days.  4 patch  0        Review of Systems Review of Systems  Constitutional: Negative for fever, appetite change, fatigue and unexpected weight change.  Eyes: Negative for pain and visual disturbance.  ENT pos for hoarseness - seen by ENT - decidedly from GERD Respiratory: Negative for cough and shortness of breath.   Cardiovascular: Negative for cp or palpitations    Gastrointestinal: Negative for nausea, diarrhea and constipation. pos for heartburn and  indigestion Genitourinary: Negative for urgency and frequency.  Skin: Negative for pallor or rash   Neurological: Negative for weakness, light-headedness, numbness and headaches.  Hematological: Negative for adenopathy. Does not bruise/bleed easily.  Psychiatric/Behavioral: Negative for dysphoric mood. The patient is not nervous/anxious.          Objective:   Physical Exam  Constitutional: She appears well-developed and well-nourished. No distress.       overwt and well appearing   HENT:  Head: Normocephalic and atraumatic.  Mouth/Throat: Oropharynx is clear and moist. No oropharyngeal exudate.  Eyes: Conjunctivae and EOM are normal. Pupils are equal, round, and reactive to light. No scleral icterus.  Neck: Normal range of motion. Neck supple. No JVD present. Carotid bruit is not present. Erythema present. No thyromegaly present.  Cardiovascular: Normal rate, regular rhythm, normal heart sounds and intact distal pulses.  Exam reveals no gallop.   Pulmonary/Chest: Effort normal and breath sounds normal. No respiratory distress. She has no wheezes. She has no rales.  Abdominal: Soft. Bowel sounds are normal. She exhibits no distension and no mass. There is no tenderness. There is no rebound and no guarding.  Musculoskeletal: She exhibits no edema.  Lymphadenopathy:    She has no cervical adenopathy.  Neurological: She is alert. She has normal reflexes. Coordination normal.  Skin: Skin is warm and dry. No rash noted. No erythema. No pallor.  Psychiatric: She has a normal mood and affect.          Assessment & Plan:

## 2011-10-27 NOTE — Assessment & Plan Note (Addendum)
Worse gerd since being on 2 courses of abx/ increased stress and surgery Also ongoing pain issues Re-emphasized avoidance of nsaids Will continue on bid ppi and zantac and re ref to GI  Last endo was 80- reviewed old records today

## 2011-11-21 ENCOUNTER — Ambulatory Visit: Payer: 59 | Admitting: Internal Medicine

## 2011-11-22 ENCOUNTER — Other Ambulatory Visit: Payer: Self-pay | Admitting: *Deleted

## 2011-11-22 MED ORDER — LISDEXAMFETAMINE DIMESYLATE 60 MG PO CAPS: 60.0000 mg | ORAL_CAPSULE | ORAL | Status: DC

## 2011-11-22 NOTE — Telephone Encounter (Signed)
Patient notified as instructed by telephone. Prescription left at front desk.  

## 2011-11-22 NOTE — Telephone Encounter (Signed)
Px printed for pick up in IN box  

## 2011-11-22 NOTE — Telephone Encounter (Signed)
Patient called requesting a refill on her Vyvanse. Please call when ready for pickup.

## 2011-12-07 ENCOUNTER — Other Ambulatory Visit: Payer: Self-pay

## 2011-12-07 MED ORDER — PANTOPRAZOLE SODIUM 40 MG PO TBEC: DELAYED_RELEASE_TABLET | ORAL | Status: DC

## 2011-12-07 NOTE — Telephone Encounter (Signed)
[facility] [address] request refill Protonix 40 mg #180 x 3.

## 2011-12-08 ENCOUNTER — Ambulatory Visit (INDEPENDENT_AMBULATORY_CARE_PROVIDER_SITE_OTHER): Payer: 59 | Admitting: Internal Medicine

## 2011-12-08 ENCOUNTER — Encounter: Payer: Self-pay | Admitting: Internal Medicine

## 2011-12-08 VITALS — BP 132/78 | HR 88 | Ht 65.0 in | Wt 219.0 lb

## 2011-12-08 DIAGNOSIS — K219 Gastro-esophageal reflux disease without esophagitis: Secondary | ICD-10-CM

## 2011-12-08 DIAGNOSIS — E669 Obesity, unspecified: Secondary | ICD-10-CM

## 2011-12-08 DIAGNOSIS — J387 Other diseases of larynx: Secondary | ICD-10-CM

## 2011-12-08 DIAGNOSIS — Z1211 Encounter for screening for malignant neoplasm of colon: Secondary | ICD-10-CM

## 2011-12-08 MED ORDER — DEXLANSOPRAZOLE 60 MG PO CPDR: 60.0000 mg | DELAYED_RELEASE_CAPSULE | Freq: Every day | ORAL | Status: DC

## 2011-12-08 MED ORDER — PEG-KCL-NACL-NASULF-NA ASC-C 100 G PO SOLR: 1.0000 | Freq: Once | ORAL | Status: DC

## 2011-12-08 NOTE — Patient Instructions (Addendum)
Please try to lose at least 10 pounds in 6 months. You have been scheduled for a Colonoscopy with separate instructions given. Your prescription(s) has(have) been sent to your pharmacy for you to pick up. Your prep kit has been sent to your pharmacy for you to pick up. You have been given samples of Dexilant 60 mg. You have been given a GERD diet sheet to read and follow. You have been given a correction of Acid Reflux by Laparotomy handout.

## 2011-12-08 NOTE — Progress Notes (Signed)
Subjective:    Patient ID: Patricia [name], female    DOB: Oct 14, 1961, 51 y.o.   MRN: [id number]  HPI This pleasant lady returns for followup of GERD with laryngeal pharyngeal reflux problems. 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 Her esophagus looked normal. She ended up going on twice a day Protonix and after a little while had significant improvement essentially was doing well without heartburn, hoarseness or voice change. In the past several months she started having some increasing heartburn and noticing that her voice is giving out when she was sitting on Friday nights. There was no sore throat or dysphagia. 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 The other part of the exam was normal. Prior to that she had been under increasing stress, she's had a change child and is working from home. She is more sedentary and has gained some weight. She does drink about 4 caffeinated beverages a day. She does not smoke. She was using some Zantac at night, though is not anymore. She raise the head of her bed, though that caused some problems with back pain.  Currently she is having some mild heartburn at times. Is not necessarily everyday but it is fairly frequent. She gets worse when she sings on Friday nights, sooner than when she used to ethanol. She feels like she may have some early satiety at times. Allergies  Allergen Reactions  . Amphetamine-Dextroamphet Er     REACTION: not effective  . Bupropion Hcl     REACTION: irritability  . Celecoxib     REACTION: edema  . Fluticasone Propionate     REACTION: not effective  . Sertraline Hcl     REACTION: wt gain   Outpatient Prescriptions Prior to Visit  Medication Sig Dispense Refill  . fluticasone (VERAMYST) 27.5 MCG/SPRAY nasal spray 2 sprays by Nasal route daily.  10 g  5  .  HYDROcodone-acetaminophen (NORCO) 10-325 MG per tablet 1-2 tablets every 8 hours as needed for pain      . Levonorgest-Eth Estrad 91-Day ([name]) 0.15-0.03 &0.01 MG TABS Take by mouth as directed.  91 each  3  . lisdexamfetamine (VYVANSE) 60 MG capsule Take 1 capsule (60 mg total) by mouth every morning.  30 capsule  0  . lisinopril-hydrochlorothiazide (PRINZIDE,ZESTORETIC) 20-12.5 MG per tablet 1 BY MOUTH ONCE DAILY  90 tablet  3  . mupirocin (BACTROBAN) 2 % ointment Apply topically as needed.       . pantoprazole (PROTONIX) 40 MG tablet One tablet by mouth two times a day 30 minutes before breakfast and supper.  180 tablet  3  . ranitidine (ZANTAC) 150 MG capsule Take 150 mg by mouth 2 (two) times daily.        Marland Kitchen scopolamine (TRANSDERM-SCOP) 1.5 MG Place 1 patch (1.5 mg total) onto the skin every 3 (three) days.  4 patch  0   Past Medical History  Diagnosis Date  . Obesity   . Hidradenitis suppurativa   . Hypertension   . Rosacea   . Degenerative disc disease   . Chronic sinusitis   . GERD (gastroesophageal reflux disease)   . Depression   . Anxiety   . Allergic rhinitis   . ADD (attention deficit disorder)   . Gastritis    Past Surgical History  Procedure Date  . Cesarean section   . Spinal  fusion     L4 L5 S1  . Tonsillectomy   . Anterior cruciate ligament repair   . Pilonidal cyst drainage   . Upper gastrointestinal endoscopy 01/26/2009    Gastritis, GERD   History   Social History  . Marital Status: Divorced    Spouse Name: N/A    Number of Children: N/A  . Years of Education: N/A   Occupational History  . Account executive    Social History Main Topics  . Smoking status: Never Smoker   . Smokeless tobacco: Never Used  . Alcohol Use: Yes     occ   . Drug Use: No  . Sexually Active: None   Other Topics Concern  . None   Social History Narrative   Daily caffeine    Family History  Problem Relation Age of Onset  . Aneurysm Mother     2 abdominal  aneurysms  . Esophageal cancer Father 78  . Hypertension Sister   . Obesity Sister   . Hypertension Brother   . ADD / ADHD Son   . Breast cancer Maternal Aunt   . Colon cancer Maternal Grandmother         Review of Systems As above    Objective:   Physical Exam General:  NAD Eyes:   anicteric Lungs:  clear Heart:  S1S2 no rubs, murmurs or gallops Abdomen:  soft and nontender, BS+ obese without succussion splash Ext:   no edema    Data Reviewed: Previous endoscopy report, ENT records from 11/02/2011.         Assessment & Plan:   1. GERD (gastroesophageal reflux disease)   2. Laryngopharyngeal reflux (LPR)   3. Obesity (BMI 30-39.9)   4. Special screening for malignant neoplasms, colon       Please see the problem oriented charting below. Note she is 51 and I offered screening colonoscopy and she agreed.The risks and benefits as well as alternatives of endoscopic procedure(s) have been discussed and reviewed. All questions answered. The patient agrees to proceed. She used Benadryl for her upper endoscopy in 2010 in addition the treadmill and Versed. She does not recall any of that procedure. We discussed the possibility of propofol sedation and she has chosen moderate sedation. He may need some Benadryl again.

## 2011-12-08 NOTE — Assessment & Plan Note (Addendum)
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 It has responded to PPI therapy but now is recurring. See GERD assessment and plan. She is not interested in surgery or she can avoid A. We're going to try Dexilant 60 mg daily as a switched to see that's more effective than Protonix 40 mg twice a day. She had been on Nexium prior to that. We'll reassess. I don't think there is any urgency here but she may need to consider surgery at some point. She understands that weight loss and lifestyle modification can help as well. She will work on that. I did explain that there is a risk of laryngeal cancer. Her father had esophageal cancer related to acid reflux.

## 2011-12-08 NOTE — Assessment & Plan Note (Signed)
She has typical and atypical issues. She is having breakthrough problems despite being on twice a day pantoprazole or Protonix. Her weight is up and she is drinking about 4 caffeinated beverages a day. I want her to try to lose weight and we discussed that. The goal at least 10 pounds in 6 months. She will try to reduce caffeine as well.  She may need or be a good candidate for a fundoplication procedure but she is not interested in surgery at this time. Thus we'll try Dexilant 60 mg daily before breakfast. This may not be on her formulary very gave her the discount card and some samples to start.  Is going to return for a screening colonoscopy and we will reassess symptoms at that time. Also given her a handout regarding fundoplication surgery. GERD diet handout provided also.

## 2011-12-20 ENCOUNTER — Other Ambulatory Visit: Payer: Self-pay | Admitting: Family Medicine

## 2011-12-20 MED ORDER — LISDEXAMFETAMINE DIMESYLATE 60 MG PO CAPS: 60.0000 mg | ORAL_CAPSULE | ORAL | Status: DC

## 2011-12-20 NOTE — Telephone Encounter (Signed)
Pt request written rx Vyvanse. Please call pt [phone] when prescription ready for pick up. Pt last seen 10-27-11.

## 2011-12-20 NOTE — Telephone Encounter (Signed)
Pt needs med refill for Vivant.

## 2011-12-20 NOTE — Telephone Encounter (Signed)
Patient notified as instructed by telephone. Prescription left at front desk.  

## 2011-12-20 NOTE — Telephone Encounter (Signed)
Px printed for pick up in IN box  

## 2012-01-18 ENCOUNTER — Encounter: Payer: Self-pay | Admitting: *Deleted

## 2012-01-18 ENCOUNTER — Encounter: Payer: Self-pay | Admitting: Internal Medicine

## 2012-01-18 ENCOUNTER — Ambulatory Visit (AMBULATORY_SURGERY_CENTER): Payer: 59 | Admitting: Internal Medicine

## 2012-01-18 VITALS — BP 104/67 | HR 77 | Temp 98.5°F | Resp 16 | Ht 65.0 in | Wt 219.0 lb

## 2012-01-18 DIAGNOSIS — K644 Residual hemorrhoidal skin tags: Secondary | ICD-10-CM

## 2012-01-18 DIAGNOSIS — Z1211 Encounter for screening for malignant neoplasm of colon: Secondary | ICD-10-CM

## 2012-01-18 HISTORY — PX: COLONOSCOPY: SHX174

## 2012-01-18 MED ORDER — SODIUM CHLORIDE 0.9 % IV SOLN: 500.0000 mL | INTRAVENOUS | Status: DC

## 2012-01-18 NOTE — Progress Notes (Signed)
Patient did not experience any of the following events: a burn prior to discharge; a fall within the facility; wrong site/side/patient/procedure/implant event; or a hospital transfer or hospital admission upon discharge from the facility. (G8907) Patient did not have preoperative order for IV antibiotic SSI prophylaxis. (G8918)  

## 2012-01-18 NOTE — Op Note (Signed)
[facility] [address] [place], [place]  [address]  COLONOSCOPY PROCEDURE REPORT  PATIENT:  Patricia, [name]  MR#:  [id number] BIRTHDATE:  1960-11-05, 50 yrs. old  GENDER:  female ENDOSCOPIST:  Iva Boop, MD, [facility]  PROCEDURE DATE:  01/18/2012 PROCEDURE:  Colonoscopy 84132 ASA CLASS:  Class II INDICATIONS:  Routine Risk Screening MEDICATIONS:   These medications were titrated to patient response per physician's verbal order, Fentanyl 75 mcg IV, Versed 8 mg IV, Benadryl 12.5 mg IV  DESCRIPTION OF PROCEDURE:   After the risks benefits and alternatives of the procedure were thoroughly explained, informed consent was obtained.  Digital rectal exam was performed and revealed no abnormalities.   The LB CF-[id number] endoscope was introduced through the anus and advanced to the cecum, which was identified by both the appendix and ileocecal valve, without limitations.  The quality of the prep was excellent, using MoviPrep.  The instrument was then slowly withdrawn as the colon was fully examined. <<PROCEDUREIMAGES>>  FINDINGS:  A normal appearing cecum, ileocecal valve, and appendiceal orifice were identified. The ascending, hepatic flexure, transverse, splenic flexure, descending, sigmoid colon, and rectum appeared unremarkable.   Retroflexed views in the rectum revealed external hemorrhoids.    The time to cecum = 2:42 minutes. The scope was then withdrawn in 12:39 minutes from the cecum and the procedure completed. COMPLICATIONS:  None ENDOSCOPIC IMPRESSION: 1) Normal colon 2) External hemorrhoids RECOMMENDATIONS: Schedule Follow-up for GERD in May (office) REPEAT EXAM:  10 years routine colonoscopy  Iva Boop, MD, [facility]  CC:  Judy Pimple, MD and The Patient  n. eSIGNED:   Iva Boop at 01/18/2012 09:14 AM  Myrene Galas, [phone]

## 2012-01-18 NOTE — Patient Instructions (Addendum)
Please schedule a follow-up for May to review the acid reflux situation - you may not need to stay on as high a dose of Dexilant long-term. YOU HAD AN ENDOSCOPIC PROCEDURE TODAY AT THE [facility]: Refer to the procedure report that was given to you for any specific questions about what was found during the examination.  If the procedure report does not answer your questions, please call your gastroenterologist to clarify.  If you requested that your care partner not be given the details of your procedure findings, then the procedure report has been included in a sealed envelope for you to review at your convenience later.  YOU SHOULD EXPECT: Some feelings of bloating in the abdomen. Passage of more gas than usual.  Walking can help get rid of the air that was put into your GI tract during the procedure and reduce the bloating. If you had a lower endoscopy (such as a colonoscopy or flexible sigmoidoscopy) you may notice spotting of blood in your stool or on the toilet paper. If you underwent a bowel prep for your procedure, then you may not have a normal bowel movement for a few days.  DIET: Your first meal following the procedure should be a light meal and then it is ok to progress to your normal diet.  A half-sandwich or bowl of soup is an example of a good first meal.  Heavy or fried foods are harder to digest and may make you feel nauseous or bloated.  Likewise meals heavy in dairy and vegetables can cause extra gas to form and this can also increase the bloating.  Drink plenty of fluids but you should avoid alcoholic beverages for 24 hours.  ACTIVITY: Your care partner should take you home directly after the procedure.  You should plan to take it easy, moving slowly for the rest of the day.  You can resume normal activity the day after the procedure however you should NOT DRIVE or use heavy machinery for 24 hours (because of the sedation medicines used during the test).    SYMPTOMS TO  REPORT IMMEDIATELY: A gastroenterologist can be reached at any hour.  During normal business hours, 8:30 AM to 5:00 PM Monday through Friday, call [phone].  After hours and on weekends, please call the GI answering service at [phone] who will take a message and have the physician on call contact you.   Following lower endoscopy (colonoscopy or flexible sigmoidoscopy):  Excessive amounts of blood in the stool  Significant tenderness or worsening of abdominal pains  Swelling of the abdomen that is new, acute  Fever of 100F or higher  Following upper endoscopy (EGD)  Vomiting of blood or coffee ground material  New chest pain or pain under the shoulder blades  Painful or persistently difficult swallowing  New shortness of breath  Fever of 100F or higher  Black, tarry-looking stools  FOLLOW UP: If any biopsies were taken you will be contacted by phone or by letter within the next 1-3 weeks.  Call your gastroenterologist if you have not heard about the biopsies in 3 weeks.  Our staff will call the home number listed on your records the next business day following your procedure to check on you and address any questions or concerns that you may have at that time regarding the information given to you following your procedure. This is a courtesy call and so if there is no answer at the home number and we have not heard from  you through the emergency physician on call, we will assume that you have returned to your regular daily activities without incident.  SIGNATURES/CONFIDENTIALITY: You and/or your care partner have signed paperwork which will be entered into your electronic medical record.  These signatures attest to the fact that that the information above on your After Visit Summary has been reviewed and is understood.  Full responsibility of the confidentiality of this discharge information lies with you and/or your care-partner.   Information sheet [id number] given prior  to discharge.

## 2012-01-19 ENCOUNTER — Telehealth: Payer: Self-pay | Admitting: *Deleted

## 2012-01-19 NOTE — Telephone Encounter (Signed)
  Follow up Call-  Call back number 01/18/2012  Post procedure Call Back phone  # [phone]  Permission to leave phone message Yes     Patient questions:  Do you have a fever, pain , or abdominal swelling? no Pain Score  0 *  Have you tolerated food without any problems? yes  Have you been able to return to your normal activities? yes  Do you have any questions about your discharge instructions: Diet   no Medications  no Follow up visit  no  Do you have questions or concerns about your Care? no  Actions: * If pain score is 4 or above: No action needed, pain <4.

## 2012-01-22 ENCOUNTER — Other Ambulatory Visit: Payer: Self-pay

## 2012-01-22 MED ORDER — LISDEXAMFETAMINE DIMESYLATE 60 MG PO CAPS: 60.0000 mg | ORAL_CAPSULE | ORAL | Status: DC

## 2012-01-22 NOTE — Telephone Encounter (Signed)
[facility] Triage Call Report Triage Record Num: [id number] Operator: Caswell Corwin Patient Name: Patricia [name] Call Date & Time: 01/22/2012 9:50:50AM Patient Phone: [phone] PCP: Audrie Gallus. Tower Patient Gender: Female PCP Fax : Patient DOB: 1961-05-02 Practice Name: [facility] Reason for Call: Caller: Patricia [name]; PCP: Roxy Manns A.; CB#: [phone]; Call regarding Renewal for Vyvance 60 Mg QD; Pt is completely out of pills. Message sent to ofc per nurse Renee. Transferred to ofc as pt wants to make her annual appt. Protocol(s) Used: Medication Questions - Adult Recommended Outcome per Protocol: Provided Health Information Reason for Outcome: Caller has medication question(s) that was answered with available resources Care Advice: ~

## 2012-01-22 NOTE — Telephone Encounter (Signed)
Left message on cell phone voicemail advising patient that Rx is ready for pick up will be left at front desk. 

## 2012-01-22 NOTE — Telephone Encounter (Signed)
Px printed for pick up in IN box  

## 2012-01-22 NOTE — Telephone Encounter (Signed)
Patricia [name] with Call a nurse could not get into pts chart to request refill for Vyvanse 60 mg. Pt last seen 10/27/11.Marland Kitchen Pt can be reached at [phone] when rx is ready.

## 2012-02-05 ENCOUNTER — Telehealth: Payer: Self-pay | Admitting: Family Medicine

## 2012-02-05 NOTE — Telephone Encounter (Signed)
If she thinks she may be menopausal -stop the pill and go 2-3 months without it -- see if period stops or not on its own  Also - if sexually active- do use condoms to be on the safe side for contraception Let me know how it goes

## 2012-02-05 NOTE — Telephone Encounter (Signed)
Pt calling regarding her BCP, has about 3 weeks left of pack and wants to stop taking due to possible pre-menopausal. Would like to speak with MD about this before she has to buy another pack.

## 2012-02-05 NOTE — Telephone Encounter (Signed)
Patient advised as instructed via telephone. 

## 2012-02-06 ENCOUNTER — Telehealth: Payer: Self-pay | Admitting: Family Medicine

## 2012-02-06 NOTE — Telephone Encounter (Signed)
Triage Record Num: [id number] Operator: Patricia [name] Patient Name: Patricia [name] Call Date & Time: 02/05/2012 4:45:47PM Patient Phone: [phone] PCP: Patricia [name]. Tower Patient Gender: Female PCP Fax : Patient DOB: December 29, 1960 Practice Name: [facility] Reason for Call: Caller: Patricia [name]/Patient is calling with a question about Patricia [name].The medication was written by Patricia Manns A.. Pt calling regarding her BCP, wants to stop taking this med after she finishes this pack , has about 3 weeks left. Wants to speak with MD about this as well. Protocol(s) Used: Medication Questions - Adult Recommended Outcome per Protocol: Speak with Provider or Pharmacist within 24 hours Reason for Outcome: Older adult with questions about prescribed and/or nonprescribed medications not covered by available resources Care Advice: ~ 02/05/2012 4:49:48PM Page 1 of 1 [id number]

## 2012-02-06 NOTE — Telephone Encounter (Signed)
This was answered.

## 2012-02-21 ENCOUNTER — Telehealth: Payer: Self-pay | Admitting: Family Medicine

## 2012-02-21 MED ORDER — LISDEXAMFETAMINE DIMESYLATE 60 MG PO CAPS: 60.0000 mg | ORAL_CAPSULE | ORAL | Status: DC

## 2012-02-21 NOTE — Telephone Encounter (Signed)
Px printed for pick up in IN box  

## 2012-02-21 NOTE — Telephone Encounter (Signed)
Message copied by Judy Pimple on Wed Feb 21, 2012  5:46 PM ------      Message from: Arville Lime      Created: Wed Feb 21, 2012  5:17 PM      Contact: Patient from home number       Caller Patricia [name]/Patient; Phone Number [phone]; Message from Caller: Needs to pick up Rx for Vyvanse, please call when ready [phone], Pt has two days left

## 2012-02-22 NOTE — Telephone Encounter (Signed)
Patient advised via telephone, Rx ready for pick up will be left at front desk. 

## 2012-03-22 ENCOUNTER — Ambulatory Visit (INDEPENDENT_AMBULATORY_CARE_PROVIDER_SITE_OTHER): Payer: 59 | Admitting: Internal Medicine

## 2012-03-22 ENCOUNTER — Encounter: Payer: Self-pay | Admitting: Internal Medicine

## 2012-03-22 VITALS — BP 104/70 | HR 84 | Temp 98.1°F | Resp 16 | Ht 64.25 in | Wt 198.2 lb

## 2012-03-22 DIAGNOSIS — F988 Other specified behavioral and emotional disorders with onset usually occurring in childhood and adolescence: Secondary | ICD-10-CM

## 2012-03-22 DIAGNOSIS — E669 Obesity, unspecified: Secondary | ICD-10-CM

## 2012-03-22 DIAGNOSIS — J329 Chronic sinusitis, unspecified: Secondary | ICD-10-CM

## 2012-03-22 DIAGNOSIS — N920 Excessive and frequent menstruation with regular cycle: Secondary | ICD-10-CM

## 2012-03-22 DIAGNOSIS — I10 Essential (primary) hypertension: Secondary | ICD-10-CM

## 2012-03-22 NOTE — Assessment & Plan Note (Signed)
Nov 2011 was last time without medication .  Wasn't to have another tirla without it at some time in the future but ot now.

## 2012-03-22 NOTE — Assessment & Plan Note (Addendum)
No acute episodes since last 2007.  Has had some allergic symptoms this month .  Prior trial of saline lavage .  recommended daily use of antihistamine and saline lavage during pollen season.

## 2012-03-22 NOTE — Progress Notes (Signed)
Patient ID: Patricia [name], female   DOB: August 31, 1961, 51 y.o.   MRN: [id number]  Patient Active Problem List  Diagnoses  . OBESITY  . ANXIETY  . DEPRESSION  . ATTENTION DEFICIT DISORDER  . ESSENTIAL HYPERTENSION  . SINUSITIS, CHRONIC  . ALLERGIC RHINITIS  . GERD  . EXCESSIVE OR FREQUENT MENSTRUATION  . IRREGULAR MENSTRUAL CYCLE  . ROSACEA  . HIDRADENITIS SUPPURATIVA  . DEGENERATIVE DISC DISEASE  . TRANSAMINASES, SERUM, ELEVATED  . Routine general medical examination at a health care facility  . Gynecologic exam normal  . Other screening mammogram  . Skin lesion  . Laryngopharyngeal reflux (LPR)    Subjective:  CC:   Chief Complaint  Patient presents with  . Establish Care    [facility] transfer    HPI:   Patricia [name] a 51 y.o. female who presents To establish care as a new patient.  She is transferring from [facility] office.   Her main concerns today are 1) management of obesity.  She has lost 23 lbs in 12 months using weight watchers e tools.  Her goal is 160 lbs.  Her form is exercise is doing yardwork. 2) neck pain, acute on chronic .  She has not sleeping well for the past week,  Has recurrent neck pain due to cervical spine disease and has had prior steroid injection by mark Channing Mutters.  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  3) Constipation: mild, managed with fiber supplements prn,    Past Medical History  Diagnosis Date  . Obesity   . Hidradenitis suppurativa   . Hypertension   . Rosacea   . Degenerative disc disease   . Chronic sinusitis   . GERD (gastroesophageal reflux disease)   . Depression   . Anxiety   . Allergic rhinitis   . ADD (attention deficit disorder)   . Gastritis     Past Surgical History  Procedure Date  . Cesarean section   . Spinal fusion     L4 L5 S1  . Tonsillectomy   . Anterior cruciate ligament repair   . Pilonidal cyst drainage   . Upper gastrointestinal endoscopy 01/26/2009   Gastritis, GERD         The following portions of the patient's history were reviewed and updated as appropriate: Allergies, current medications, and problem list.    Review of Systems:   12 Pt  review of systems was negative except those addressed in the HPI,     History   Social History  . Marital Status: Divorced    Spouse Name: N/A    Number of Children: N/A  . Years of Education: N/A   Occupational History  . Account executive    Social History Main Topics  . Smoking status: Never Smoker   . Smokeless tobacco: Never Used  . Alcohol Use: 2.4 oz/week    4 Glasses of wine per week     occ   . Drug Use: No  . Sexually Active: Not on file   Other Topics Concern  . Not on file   Social History Narrative   Daily caffeine     Objective:  BP 104/70  Pulse 84  Temp(Src) 98.1 F (36.7 C) (Oral)  Resp 16  Ht 5' 4.25" (1.632 m)  Wt 198 lb 4 oz (89.926 kg)  BMI 33.77 kg/m2  SpO2 99%  General appearance: alert, cooperative and appears stated age Ears: normal TM's  and external ear canals both ears Throat: lips, mucosa, and tongue normal; teeth and gums normal Neck: no adenopathy, no carotid bruit, supple, symmetrical, trachea midline and thyroid not enlarged, symmetric, no tenderness/mass/nodules Back: symmetric, no curvature. ROM normal. No CVA tenderness. Lungs: clear to auscultation bilaterally Heart: regular rate and rhythm, S1, S2 normal, no murmur, click, rub or gallop Abdomen: soft, non-tender; bowel sounds normal; no masses,  no organomegaly Pulses: 2+ and symmetric Skin: Skin color, texture, turgor normal. No rashes or lesions Lymph nodes: Cervical, supraclavicular, and axillary nodes normal.  Assessment and Plan:  ATTENTION DEFICIT DISORDER Nov 2011 was last time without medication .  Wasn't to have another tirla without it at some time in the future but ot now.   EXCESSIVE OR FREQUENT MENSTRUATION Managed with seasonique,  Now off seasonique  since April 10, no period yet.   SINUSITIS, CHRONIC No acute episodes since last 2007.  Has had some allergic symptoms this month .  Prior trial of saline lavage .  recommended daily use of antihistamine and saline lavage during pollen season.   ESSENTIAL HYPERTENSION Stopping meds,  recheck in one week  OBESITY Spent 15 minutes discussing her current success in losing weight and ways to prevent plateaus, incuding need to fore consistent form of exercise th ensure aerobic workout.  Also discussed ways to increase the fiver in her diet without adding carbohydrate.s     Updated Medication List Outpatient Encounter Prescriptions as of 03/22/2012  Medication Sig Dispense Refill  . Dexlansoprazole (DEXILANT PO) Take by mouth.      . fluticasone (VERAMYST) 27.5 MCG/SPRAY nasal spray 2 sprays by Nasal route daily.  10 g  5  . gentamicin ointment (GARAMYCIN) 0.1 % Apply 1 application topically 3 (three) times daily.      Marland Kitchen HYDROcodone-acetaminophen (NORCO) 10-325 MG per tablet 1-2 tablets every 8 hours as needed for pain      . lisdexamfetamine (VYVANSE) 60 MG capsule Take 1 capsule (60 mg total) by mouth every morning.  30 capsule  0  . lisinopril-hydrochlorothiazide (PRINZIDE,ZESTORETIC) 20-12.5 MG per tablet 1 BY MOUTH ONCE DAILY  90 tablet  3  . mupirocin (BACTROBAN) 2 % ointment Apply topically as needed.       Marland Kitchen dexlansoprazole (DEXILANT) 60 MG capsule Take 1 capsule (60 mg total) by mouth daily. Take 30 minutes before breakfast  30 capsule  11  . Levonorgest-Eth Estrad 91-Day ([name]) 0.15-0.03 &0.01 MG TABS Take by mouth as directed.  91 each  3  . lisdexamfetamine (VYVANSE) 60 MG capsule Take 1 capsule (60 mg total) by mouth every morning.  30 capsule  0  . lisdexamfetamine (VYVANSE) 60 MG capsule Take 1 capsule (60 mg total) by mouth every morning.  30 capsule  0  . lisdexamfetamine (VYVANSE) 60 MG capsule Take 1 capsule (60 mg total) by mouth every morning.  30 capsule  0  .  lisdexamfetamine (VYVANSE) 60 MG capsule Take 1 capsule (60 mg total) by mouth every morning.  30 capsule  0     Orders Placed This Encounter  Procedures  . HM COLONOSCOPY    No Follow-up on file.

## 2012-03-22 NOTE — Patient Instructions (Signed)
Consider the Low Glycemic Index Diet and 6 smaller meals daily .  This boosts your metabolism and regulates your sugars:   7 AM Low carbohydrate Protein  Shakes (EAS Carb Control  Or Atkins ,  Available everywhere,   In  cases at [employer] )  2.5 carbs  (Add or substitute a toasted sandwhich thin w/ peanut butter)  10 AM: Protein bar by Atkins (snack size,  Chocolate lover's variety at  [employer])    Lunch: sandwich on pita bread or flatbread (Joseph's makes a pita bread and a flat bread , available at [employer] and [employer]; Toufayah makes a low carb flatbread available at [employer] and [employer]) Mission makes a low carb whole wheat tortilla available at [employer],and most grocery stores   3 PM:  Mid day :  Another protein bar,  Or a  cheese stick, 1/4 cup of almonds, walnuts, pistachios, pecans, peanuts,  Macadamia nuts  6 PM  Dinner:  "mean and green:"  Meat/chicken/fish, salad, and green veggie : use ranch, vinagrette,  Blue cheese, etc  9 PM snack : Breyer's low carb fudgsicle or  ice cream bar (Carb Smart), or  Weight Watcher's ice cream bar , or another protein shake  

## 2012-03-22 NOTE — Assessment & Plan Note (Signed)
Managed with seasonique,  Now off seasonique since April 10, no period yet.

## 2012-03-22 NOTE — Assessment & Plan Note (Signed)
Stopping meds,  recheck in one week

## 2012-03-25 ENCOUNTER — Encounter: Payer: Self-pay | Admitting: Internal Medicine

## 2012-03-25 NOTE — Assessment & Plan Note (Signed)
Spent 15 minutes discussing her current success in losing weight and ways to prevent plateaus, incuding need to fore consistent form of exercise th ensure aerobic workout.  Also discussed ways to increase the fiver in her diet without adding carbohydrate.s

## 2012-03-26 ENCOUNTER — Other Ambulatory Visit: Payer: Self-pay

## 2012-03-26 MED ORDER — LISDEXAMFETAMINE DIMESYLATE 60 MG PO CAPS: 60.0000 mg | ORAL_CAPSULE | ORAL | Status: DC

## 2012-03-26 NOTE — Telephone Encounter (Signed)
Patient advised via telephone, Rx ready for pick up will be left at front desk. 

## 2012-03-26 NOTE — Telephone Encounter (Signed)
Pt request rx Vyvanse. Call for pick up.

## 2012-03-26 NOTE — Telephone Encounter (Signed)
Px printed for pick up in IN box  

## 2012-04-01 ENCOUNTER — Telehealth: Payer: Self-pay | Admitting: *Deleted

## 2012-04-01 ENCOUNTER — Other Ambulatory Visit (INDEPENDENT_AMBULATORY_CARE_PROVIDER_SITE_OTHER): Payer: 59 | Admitting: *Deleted

## 2012-04-01 DIAGNOSIS — I10 Essential (primary) hypertension: Secondary | ICD-10-CM

## 2012-04-01 DIAGNOSIS — E785 Hyperlipidemia, unspecified: Secondary | ICD-10-CM

## 2012-04-01 NOTE — Telephone Encounter (Signed)
Patient came in for labs this morning. She has a cpx on wed. What labs would you like for me to order?

## 2012-04-02 NOTE — Telephone Encounter (Signed)
TSH, CMET, fasting lipids.  Thanks. i did not see this until just now.

## 2012-04-02 NOTE — Telephone Encounter (Signed)
Labs ordered.

## 2012-04-03 ENCOUNTER — Ambulatory Visit (INDEPENDENT_AMBULATORY_CARE_PROVIDER_SITE_OTHER): Payer: 59 | Admitting: Internal Medicine

## 2012-04-03 ENCOUNTER — Encounter: Payer: Self-pay | Admitting: Internal Medicine

## 2012-04-03 VITALS — BP 122/78 | HR 87 | Temp 98.2°F | Resp 16 | Ht 64.5 in | Wt 197.0 lb

## 2012-04-03 DIAGNOSIS — L732 Hidradenitis suppurativa: Secondary | ICD-10-CM

## 2012-04-03 DIAGNOSIS — I10 Essential (primary) hypertension: Secondary | ICD-10-CM

## 2012-04-03 DIAGNOSIS — E669 Obesity, unspecified: Secondary | ICD-10-CM

## 2012-04-03 DIAGNOSIS — Z1239 Encounter for other screening for malignant neoplasm of breast: Secondary | ICD-10-CM

## 2012-04-03 DIAGNOSIS — Z0000 Encounter for general adult medical examination without abnormal findings: Secondary | ICD-10-CM

## 2012-04-03 LAB — COMPREHENSIVE METABOLIC PANEL
ALT: 29 U/L (ref 0–35)
AST: 28 U/L (ref 0–37)
Calcium: 9.8 mg/dL (ref 8.4–10.5)
Chloride: 103 mEq/L (ref 96–112)
Creat: 0.65 mg/dL (ref 0.50–1.10)
Sodium: 141 mEq/L (ref 135–145)

## 2012-04-03 LAB — LIPID PANEL
Total CHOL/HDL Ratio: 3.6 Ratio
VLDL: 19 mg/dL (ref 0–40)

## 2012-04-03 LAB — TSH: TSH: 0.974 u[IU]/mL (ref 0.350–4.500)

## 2012-04-03 MED ORDER — LISDEXAMFETAMINE DIMESYLATE 60 MG PO CAPS: 60.0000 mg | ORAL_CAPSULE | ORAL | Status: DC

## 2012-04-03 MED ORDER — SULFAMETHOXAZOLE-TRIMETHOPRIM 800-160 MG PO TABS: 1.0000 | ORAL_TABLET | Freq: Two times a day (BID) | ORAL | Status: AC

## 2012-04-03 MED ORDER — HYDROCHLOROTHIAZIDE 12.5 MG PO CAPS: 25.0000 mg | ORAL_CAPSULE | Freq: Every day | ORAL | Status: DC

## 2012-04-03 NOTE — Patient Instructions (Addendum)
I am stopping your current bp medication and just prescribing HCTZ.  Start with  one capsule daily in the morning   Increase to 2 daily in the morning for systolic perstisently > 140  You can treat your skin infection with topical gentamycin twice daily,  Or repeat the oral antibiotic I called in.   Mammogram is due.,

## 2012-04-03 NOTE — Assessment & Plan Note (Addendum)
Pelvic exam was deferred today at patient's request. Her last PAP  Smear was normal last year. Breast exam was normal, annual screening mammogram was ordered.

## 2012-04-03 NOTE — Progress Notes (Signed)
Patient ID: Patricia [name], female   DOB: 1961-06-21, 51 y.o.   MRN: [id number] Subjective:     Patricia [name] is a 51 y.o. female here for a routine exam.  Current complaints: none  Personal health questionnaire reviewed: yes.   Gynecologic History No LMP recorded. Patient is not currently having periods (Reason: Perimenopausal). Contraception: abstinence Last WUJ:WJXBJ 2012. Results were: normal Last mammogram: April 2012. Results were: normal  Obstetric History OB History    Grav Para Term Preterm Abortions TAB SAB Ect Mult Living                   The following portions of the patient's history were reviewed and updated as appropriate: allergies, current medications, past family history, past medical history, past social history, past surgical history and problem list.  Review of Systems A comprehensive review of systems was negative.    Objective:    BP 122/78  Pulse 87  Temp(Src) 98.2 F (36.8 C) (Oral)  Resp 16  Ht 5' 4.5" (1.638 m)  Wt 197 lb (89.359 kg)  BMI 33.29 kg/m2  SpO2 97%  General Appearance:    Alert, cooperative, no distress, appears stated age  Head:    Normocephalic, without obvious abnormality, atraumatic  Eyes:    PERRL, conjunctiva/corneas clear, EOM's intact, fundi    benign, both eyes  Ears:    Normal TM's and external ear canals, both ears  Nose:   Nares normal, septum midline, mucosa normal, no drainage    or sinus tenderness  Throat:   Lips, mucosa, and tongue normal; teeth and gums normal  Neck:   Supple, symmetrical, trachea midline, no adenopathy;    thyroid:  no enlargement/tenderness/nodules; no carotid   bruit or JVD  Back:     Symmetric, no curvature, ROM normal, no CVA tenderness  Lungs:     Clear to auscultation bilaterally, respirations unlabored  Chest Wall:    No tenderness or deformity   Heart:    Regular rate and rhythm, S1 and S2 normal, no murmur, rub   or gallop  Breast Exam:    No tenderness, masses, or nipple abnormality    Abdomen:     Soft, non-tender, bowel sounds active all four quadrants,    no masses, no organomegaly  Extremities:   Extremities normal, atraumatic, no cyanosis or edema  Pulses:   2+ and symmetric all extremities  Skin:   Skin color, texture, turgor normal, no rashes or lesions  Lymph nodes:   Cervical, supraclavicular, and axillary nodes normal  Neurologic:   CNII-XII intact, normal strength, sensation and reflexes    throughout      Assessment:    Healthy female exam.   Routine general medical examination at a health care facility Pelvic exam was deferred today at patient's request. Her last PAP  Smear was normal last year. Breast exam was normal, annual screening mammogram was ordered.  ESSENTIAL HYPERTENSION Her blood pressure has improved with wright loss.  Medications were adjusted to HCTZ  onl at 12.5 mg daily with instructions to increase to 25 mg daily for persistent elevation of systolic to 140 or higher.   OBESITY Improving.  She has achieved a wt loss of 25 lbs over the last year through adherence to Weight [employer] program.     Updated Medication List Outpatient Encounter Prescriptions as of 04/03/2012  Medication Sig Dispense Refill  . dexlansoprazole (DEXILANT) 60 MG capsule Take 1 capsule (60 mg total) by mouth daily. Take  30 minutes before breakfast  30 capsule  11  . fluticasone (VERAMYST) 27.5 MCG/SPRAY nasal spray 2 sprays by Nasal route daily.  10 g  5  . gentamicin ointment (GARAMYCIN) 0.1 % Apply 1 application topically 3 (three) times daily.      Marland Kitchen HYDROcodone-acetaminophen (NORCO) 10-325 MG per tablet 1-2 tablets every 8 hours as needed for pain      . lisdexamfetamine (VYVANSE) 60 MG capsule Take 1 capsule (60 mg total) by mouth every morning.  30 capsule  0  . lisdexamfetamine (VYVANSE) 60 MG capsule Take 1 capsule (60 mg total) by mouth every morning. May refill on or after August 28  30 capsule  0  . lisdexamfetamine (VYVANSE) 60 MG capsule Take 1  capsule (60 mg total) by mouth every morning.  30 capsule  0  . mupirocin (BACTROBAN) 2 % ointment Apply topically as needed.       Marland Kitchen DISCONTD: lisdexamfetamine (VYVANSE) 60 MG capsule Take 1 capsule (60 mg total) by mouth every morning.  30 capsule  0  . DISCONTD: lisdexamfetamine (VYVANSE) 60 MG capsule Take 1 capsule (60 mg total) by mouth every morning.  30 capsule  0  . DISCONTD: lisdexamfetamine (VYVANSE) 60 MG capsule Take 1 capsule (60 mg total) by mouth every morning.  30 capsule  0  . DISCONTD: lisinopril-hydrochlorothiazide (PRINZIDE,ZESTORETIC) 20-12.5 MG per tablet 1 BY MOUTH ONCE DAILY  90 tablet  3  . DISCONTD: lisinopril-hydrochlorothiazide (PRINZIDE,ZESTORETIC) 20-12.5 MG per tablet       . hydrochlorothiazide (MICROZIDE) 12.5 MG capsule Take 2 capsules (25 mg total) by mouth daily.  60 capsule  1  . sulfamethoxazole-trimethoprim (BACTRIM DS,SEPTRA DS) 800-160 MG per tablet Take 1 tablet by mouth 2 (two) times daily.  14 tablet  0  . DISCONTD: Dexlansoprazole (DEXILANT PO) Take by mouth.      . DISCONTD: Levonorgest-Eth Estrad 91-Day ([name]) 0.15-0.03 &0.01 MG TABS Take by mouth as directed.  91 each  3  . DISCONTD: lisdexamfetamine (VYVANSE) 60 MG capsule Take 1 capsule (60 mg total) by mouth every morning.  30 capsule  0  . DISCONTD: lisdexamfetamine (VYVANSE) 60 MG capsule Take 1 capsule (60 mg total) by mouth every morning.  30 capsule  0  . DISCONTD: lisdexamfetamine (VYVANSE) 60 MG capsule Take 1 capsule (60 mg total) by mouth every morning.  30 capsule  0  . DISCONTD: lisdexamfetamine (VYVANSE) 60 MG capsule Take 1 capsule (60 mg total) by mouth every morning.  30 capsule  0      Plan:    Education reviewed: safe sex/STD prevention.

## 2012-04-06 NOTE — Assessment & Plan Note (Addendum)
Improving.  She has achieved a wt loss of 25 lbs over the last year through adherence to Weight Wat[employer] program.

## 2012-04-06 NOTE — Assessment & Plan Note (Signed)
Her blood pressure has improved with wright loss.  Medications were adjusted to HCTZ  onl at 12.5 mg daily with instructions to increase to 25 mg daily for persistent elevation of systolic to 140 or higher.

## 2012-04-18 ENCOUNTER — Ambulatory Visit: Payer: Self-pay | Admitting: Internal Medicine

## 2012-04-23 ENCOUNTER — Other Ambulatory Visit: Payer: 59

## 2012-04-29 ENCOUNTER — Encounter: Payer: 59 | Admitting: Family Medicine

## 2012-05-03 ENCOUNTER — Telehealth: Payer: Self-pay | Admitting: Internal Medicine

## 2012-05-03 NOTE — Telephone Encounter (Signed)
I have updated her allergies

## 2012-05-03 NOTE — Telephone Encounter (Signed)
Caller: Patricia [name]/Patient; PCP: Duncan Dull; CB#: [phone]; ; ; Call regarding Allergic Reaction;  Pt calling regarding to f/u on triage call from 05/01/12, started with swelling to both legs and was advised to proceed to ED and did not go, states she is fine and took Benadryl for redness and rash to low ext., thinks this was allergic reaction to Bactrim that she started on 04/27/12 for skin infection, pt has stopped this medication. No s/s at this time just wanted office to be aware of what was going on.

## 2012-05-16 ENCOUNTER — Encounter: Payer: Self-pay | Admitting: Internal Medicine

## 2012-06-13 ENCOUNTER — Ambulatory Visit (INDEPENDENT_AMBULATORY_CARE_PROVIDER_SITE_OTHER): Payer: 59 | Admitting: Internal Medicine

## 2012-06-13 ENCOUNTER — Encounter: Payer: Self-pay | Admitting: Internal Medicine

## 2012-06-13 VITALS — BP 128/90 | HR 98 | Temp 98.7°F | Resp 16 | Wt 202.5 lb

## 2012-06-13 DIAGNOSIS — Z22322 Carrier or suspected carrier of Methicillin resistant Staphylococcus aureus: Secondary | ICD-10-CM

## 2012-06-13 DIAGNOSIS — J069 Acute upper respiratory infection, unspecified: Secondary | ICD-10-CM

## 2012-06-13 MED ORDER — AMOXICILLIN-POT CLAVULANATE 875-125 MG PO TABS: 1.0000 | ORAL_TABLET | Freq: Two times a day (BID) | ORAL | Status: AC

## 2012-06-13 MED ORDER — MUPIROCIN 2 % EX OINT: TOPICAL_OINTMENT | Freq: Two times a day (BID) | CUTANEOUS | Status: DC

## 2012-06-13 NOTE — Progress Notes (Signed)
Patient ID: Patricia [name], female   DOB: 1961/07/11, 52 y.o.   MRN: [id number]  Patient Active Problem List  Diagnosis  . OBESITY  . ANXIETY  . DEPRESSION  . ATTENTION DEFICIT DISORDER  . ESSENTIAL HYPERTENSION  . ALLERGIC RHINITIS  . GERD  . EXCESSIVE OR FREQUENT MENSTRUATION  . IRREGULAR MENSTRUAL CYCLE  . ROSACEA  . DEGENERATIVE DISC DISEASE  . TRANSAMINASES, SERUM, ELEVATED  . Routine general medical examination at a health care facility  . Gynecologic exam normal  . Other screening mammogram  . Skin lesion  . Laryngopharyngeal reflux (LPR)  . Hidradenitis suppurativa of left axilla  . Upper respiratory infection    Subjective:  CC:   Chief Complaint  Patient presents with  . Sinusitis    x 3 days  . Sore Throat    HPI:   Patricia [name] a 51 y.o. female who presents 5 day history of sinus drainage and sore throat with congestion.  Tried resuming flonase  for management of non-allergic symptoms with no significant relief. On July 24 she was evaluated by ENT for persistent pharyngitis and underwent nasal endoscopy. This was done to the right side since she has chronic deviated septum on the left. after recent treatment with gentamycin topical and 1 week of Septra for presumed MRSA infection  In March.   2) She continues to have crusting lesions on the inside of her right nasal passage and is concerned that she has recurrent MRSA infection. She had a lot of questions today about whether this is recurrent or persistent..    Past Medical History  Diagnosis Date  . Obesity   . Hidradenitis suppurativa   . Hypertension   . Rosacea   . Degenerative disc disease   . Chronic sinusitis   . GERD (gastroesophageal reflux disease)   . Depression   . Anxiety   . Allergic rhinitis   . ADD (attention deficit disorder)   . Gastritis     Past Surgical History  Procedure Date  . Cesarean section   . Spinal fusion     L4 L5 S1  . Tonsillectomy   . Anterior cruciate ligament  repair   . Pilonidal cyst drainage   . Upper gastrointestinal endoscopy 01/26/2009    Gastritis, GERD         The following portions of the patient's history were reviewed and updated as appropriate: Allergies, current medications, and problem list.    Review of Systems:   12 Pt  review of systems was negative except those addressed in the HPI,     History   Social History  . Marital Status: Divorced    Spouse Name: N/A    Number of Children: N/A  . Years of Education: N/A   Occupational History  . Account executive    Social History Main Topics  . Smoking status: Never Smoker   . Smokeless tobacco: Never Used  . Alcohol Use: 2.4 oz/week    4 Glasses of wine per week     occ   . Drug Use: No  . Sexually Active: Not on file   Other Topics Concern  . Not on file   Social History Narrative   Daily caffeine     Objective:  BP 128/90  Pulse 98  Temp 98.7 F (37.1 C) (Oral)  Resp 16  Wt 202 lb 8 oz (91.853 kg)  SpO2 97%  General appearance: alert, cooperative and appears stated age Ears: normal TM's and external  ear canals both ears Throat: lips, mucosa, and tongue normal; teeth and gums normal Neck: no adenopathy, no carotid bruit, supple, symmetrical, trachea midline and thyroid not enlarged, symmetric, no tenderness/mass/nodules Back: symmetric, no curvature. ROM normal. No CVA tenderness. Lungs: clear to auscultation bilaterally Heart: regular rate and rhythm, S1, S2 normal, no murmur, click, rub or gallop Abdomen: soft, non-tender; bowel sounds normal; no masses,  no organomegaly Pulses: 2+ and symmetric Skin: Skin color, texture, turgor normal. No rashes or lesions Lymph nodes: Cervical, supraclavicular, and axillary nodes normal.  Assessment and Plan:  Upper respiratory infection Given chronicity of symptoms, development of facial pain and exam consistent with bacterial URI,  Will treat with empiric antibiotics, decongestants, and saline  lavage.   MRSA nasal colonization It is not clear to me whether this was confirmed with culture. However she has been treated in the past with mupirocin which resolved the ulcers in her nose other transiently. Since she still has some crusting on inside nare on the right I will resume mupirocin and have her continue to moisturize the nasal passages after that to prevent breaks in skin   Updated Medication List Outpatient Encounter Prescriptions as of 06/13/2012  Medication Sig Dispense Refill  . clindamycin (CLEOCIN T) 1 % external solution Apply 1 application topically 2 (two) times daily.      Marland Kitchen dexlansoprazole (DEXILANT) 60 MG capsule Take 1 capsule (60 mg total) by mouth daily. Take 30 minutes before breakfast  30 capsule  11  . fluticasone (VERAMYST) 27.5 MCG/SPRAY nasal spray 2 sprays by Nasal route daily.  10 g  5  . gentamicin ointment (GARAMYCIN) 0.1 % Apply 1 application topically 3 (three) times daily.      . hydrochlorothiazide (MICROZIDE) 12.5 MG capsule Take 2 capsules (25 mg total) by mouth daily.  60 capsule  1  . HYDROcodone-acetaminophen (NORCO) 10-325 MG per tablet 1-2 tablets every 8 hours as needed for pain      . lisdexamfetamine (VYVANSE) 60 MG capsule Take 1 capsule (60 mg total) by mouth every morning. May refill on or after August 28  30 capsule  0  . mupirocin ointment (BACTROBAN) 2 % Apply topically 2 (two) times daily. For 2 weeks  22 g  3  . DISCONTD: mupirocin (BACTROBAN) 2 % ointment Apply topically as needed.       Marland Kitchen amoxicillin-clavulanate (AUGMENTIN) 875-125 MG per tablet Take 1 tablet by mouth 2 (two) times daily.  14 tablet  0  . DISCONTD: lisdexamfetamine (VYVANSE) 60 MG capsule Take 1 capsule (60 mg total) by mouth every morning.  30 capsule  0  . DISCONTD: lisdexamfetamine (VYVANSE) 60 MG capsule Take 1 capsule (60 mg total) by mouth every morning.  30 capsule  0

## 2012-06-13 NOTE — Patient Instructions (Addendum)
You have a viral  Syndrome .  The post nasal drip is causing your sore throat.  Lavage your sinuses twice daily with Simply saline nasal spray.  Use benadryl 25 mg every 8 hours and Sudafed PE 10 to 30 every 8 hours to manage the drainage and congestion.  Gargle with salt water often for the sore throat.  OTC Delsym as needed for cough    Use the mupirocin twice daily in both nostrils  for 2 weeks,  Then continue moisturizing your nostril with vasline.

## 2012-06-15 DIAGNOSIS — Z22322 Carrier or suspected carrier of Methicillin resistant Staphylococcus aureus: Secondary | ICD-10-CM

## 2012-06-15 DIAGNOSIS — J069 Acute upper respiratory infection, unspecified: Secondary | ICD-10-CM | POA: Insufficient documentation

## 2012-06-15 HISTORY — DX: Carrier or suspected carrier of methicillin resistant Staphylococcus aureus: Z22.322

## 2012-06-15 NOTE — Assessment & Plan Note (Signed)
It is not clear to me whether this was confirmed with culture. However she has been treated in the past with mupirocin which resolved the ulcers in her nose other transiently. Since she still has some crusting on inside nare on the right I will resume mupirocin and have her continue to moisturize the nasal passages after that to prevent breaks in skin

## 2012-06-15 NOTE — Assessment & Plan Note (Signed)
Given chronicity of symptoms, development of facial pain and exam consistent with bacterial URI,  Will treat with empiric antibiotics, decongestants, and saline lavage.   

## 2012-06-23 ENCOUNTER — Encounter: Payer: Self-pay | Admitting: Internal Medicine

## 2012-06-24 ENCOUNTER — Other Ambulatory Visit: Payer: Self-pay | Admitting: Internal Medicine

## 2012-06-24 DIAGNOSIS — I10 Essential (primary) hypertension: Secondary | ICD-10-CM

## 2012-06-24 MED ORDER — LISDEXAMFETAMINE DIMESYLATE 70 MG PO CAPS: 70.0000 mg | ORAL_CAPSULE | ORAL | Status: DC

## 2012-06-24 MED ORDER — LISINOPRIL-HYDROCHLOROTHIAZIDE 10-12.5 MG PO TABS: 1.0000 | ORAL_TABLET | Freq: Every day | ORAL | Status: DC

## 2012-06-26 ENCOUNTER — Other Ambulatory Visit: Payer: Self-pay | Admitting: Internal Medicine

## 2012-06-26 DIAGNOSIS — I10 Essential (primary) hypertension: Secondary | ICD-10-CM

## 2012-06-26 MED ORDER — LISINOPRIL-HYDROCHLOROTHIAZIDE 10-12.5 MG PO TABS: 1.0000 | ORAL_TABLET | Freq: Every day | ORAL | Status: AC

## 2012-07-12 ENCOUNTER — Encounter: Payer: Self-pay | Admitting: Internal Medicine

## 2012-07-12 ENCOUNTER — Ambulatory Visit (INDEPENDENT_AMBULATORY_CARE_PROVIDER_SITE_OTHER): Payer: 59 | Admitting: Internal Medicine

## 2012-07-12 VITALS — BP 120/88 | HR 98 | Temp 98.3°F | Wt 211.0 lb

## 2012-07-12 DIAGNOSIS — Z79899 Other long term (current) drug therapy: Secondary | ICD-10-CM

## 2012-07-12 DIAGNOSIS — I10 Essential (primary) hypertension: Secondary | ICD-10-CM

## 2012-07-12 DIAGNOSIS — E669 Obesity, unspecified: Secondary | ICD-10-CM

## 2012-07-12 DIAGNOSIS — R0989 Other specified symptoms and signs involving the circulatory and respiratory systems: Secondary | ICD-10-CM

## 2012-07-12 DIAGNOSIS — F411 Generalized anxiety disorder: Secondary | ICD-10-CM

## 2012-07-12 LAB — BASIC METABOLIC PANEL
CO2: 28 mEq/L (ref 19–32)
GFR: 134.73 mL/min (ref >60.00)
Glucose, Bld: 92 mg/dL (ref 70–99)
Potassium: 4.4 mEq/L (ref 3.5–5.1)
Sodium: 137 mEq/L (ref 135–145)

## 2012-07-12 MED ORDER — ALPRAZOLAM 0.5 MG PO TABS: ORAL_TABLET | ORAL | Status: DC

## 2012-07-12 MED ORDER — MUPIROCIN 2 % EX OINT: TOPICAL_OINTMENT | Freq: Two times a day (BID) | CUTANEOUS | Status: DC

## 2012-07-12 MED ORDER — FLUTICASONE FUROATE 27.5 MCG/SPRAY NA SUSP: 2.0000 | Freq: Every day | NASAL | Status: DC

## 2012-07-12 NOTE — Progress Notes (Signed)
Patient ID: Patricia [name], female   DOB: February 11, 1961, 51 y.o.   MRN: [id number]  Patient Active Problem List  Diagnosis  . OBESITY  . ANXIETY  . DEPRESSION  . ATTENTION DEFICIT DISORDER  . ESSENTIAL HYPERTENSION  . ALLERGIC RHINITIS  . GERD  . EXCESSIVE OR FREQUENT MENSTRUATION  . IRREGULAR MENSTRUAL CYCLE  . ROSACEA  . DEGENERATIVE DISC DISEASE  . TRANSAMINASES, SERUM, ELEVATED  . Routine general medical examination at a health care facility  . Gynecologic exam normal  . Other screening mammogram  . Skin lesion  . Laryngopharyngeal reflux (LPR)  . Hidradenitis suppurativa of left axilla  . Upper respiratory infection  . MRSA nasal colonization  . Decreased pulses in feet    Subjective:  CC:   Chief Complaint  Patient presents with  . medication concerns    vyvanse and bp medication  . Anxiety    HPI:   Patricia [name] a 51 y.o. female who presents for followup on chronic conditions including hypertension and anxiety. Her blood pressure management has improved with the addition of lisinopril. Home blood pressures have been within range and she reports no adverse effects including cough. She is having increased stress resulting in no problems concentrating and remembering details. Her last appointment with Dr. Imogene Burn was last November and he started her on a medication for depression which caused her to wake up every night at 2 am with insomnia so she did not continue it. She is requesting assistance managing her anxiety. She does not think that the Vyvanse she takes for ADD  Is aggravating her anxiety because she notes that the symptoms occurred even when she omits the medication as an experiment.  3) She has a new symptom of coldness affecting both legs aggravated by prolonged sitting.  She does not exercise regularly and does not have any history of claudication.    Past Medical History  Diagnosis Date  . Obesity   . Hidradenitis suppurativa   . Hypertension   . Rosacea     . Degenerative disc disease   . Chronic sinusitis   . GERD (gastroesophageal reflux disease)   . Depression   . Anxiety   . Allergic rhinitis   . ADD (attention deficit disorder)   . Gastritis     Past Surgical History  Procedure Date  . Cesarean section   . Spinal fusion     L4 L5 S1  . Tonsillectomy   . Anterior cruciate ligament repair   . Pilonidal cyst drainage   . Upper gastrointestinal endoscopy 01/26/2009    Gastritis, GERD         The following portions of the patient's history were reviewed and updated as appropriate: Allergies, current medications, and problem list.    Review of Systems:   12 Pt  review of systems was negative except those addressed in the HPI,     History   Social History  . Marital Status: Divorced    Spouse Name: N/A    Number of Children: N/A  . Years of Education: N/A   Occupational History  . Account executive    Social History Main Topics  . Smoking status: Never Smoker   . Smokeless tobacco: Never Used  . Alcohol Use: 2.4 oz/week    4 Glasses of wine per week     occ   . Drug Use: No  . Sexually Active: Not on file   Other Topics Concern  . Not on file  Social History Narrative   Daily caffeine     Objective:  BP 120/88  Pulse 98  Temp 98.3 F (36.8 C) (Oral)  Wt 211 lb (95.709 kg)  General appearance: alert, cooperative and appears stated age Neck: no adenopathy, no carotid bruit, supple, symmetrical, trachea midline and thyroid not enlarged, symmetric, no tenderness/mass/nodules Back: symmetric, no curvature. ROM normal. No CVA tenderness. Lungs: clear to auscultation bilaterally Heart: regular rate and rhythm, S1, S2 normal, no murmur, click, rub or gallop Abdomen: soft, non-tender; bowel sounds normal; no masses,  no organomegaly Pulses: DPS weak , cap refill sluggish bilaterally Skin: Skin color, texture, turgor normal. No rashes or lesions Lymph nodes: Cervical, supraclavicular, and axillary  nodes normal.  Assessment and Plan:  ANXIETY Anxiety only occurs at work and x-rays and episodes are grossly panic attacks. We discussed a trial of low-dose alprazolam. She finds it she is needing to use it on a daily basis we discussed starting an SSRI. She will return in one month. We also discussed the utility and seeing a therapist for talk therapy. She has seen Kathrin Ruddy in the past and would like to resume therapy with her.  ESSENTIAL HYPERTENSION Well-controlled now on current medications. No changes today.  OBESITY BMI has increased since last visit to 2 of weight gain. She is not exercising regularly. Given her concurrent anxiety I have strongly recommended she begin an exercise program. Th the obstacle to this really amounts to amounts to time management more than anything. I've also given her low carbohydrate diet to consider trying.  Decreased pulses in feet She's been experiencing a sensation of coldness in both legs when she has prolonged sitting. She does have decreased pulses and spent in this delayed cap refill in both feet. Refer to Dr. Fabienne Bruns in [place] per patient request, for evaluation with ankle-brachial indices and ultrasound if needed.   Updated Medication List Outpatient Encounter Prescriptions as of 07/12/2012  Medication Sig Dispense Refill  . dexlansoprazole (DEXILANT) 60 MG capsule Take 1 capsule (60 mg total) by mouth daily. Take 30 minutes before breakfast  30 capsule  11  . fluticasone (VERAMYST) 27.5 MCG/SPRAY nasal spray Place 2 sprays into the nose daily.  10 g  5  . HYDROcodone-acetaminophen (NORCO) 10-325 MG per tablet 1-2 tablets every 8 hours as needed for pain      . lisdexamfetamine (VYVANSE) 70 MG capsule Take 1 capsule (70 mg total) by mouth every morning. May refill on or after August 28  30 capsule  0  . lisinopril-hydrochlorothiazide (PRINZIDE,ZESTORETIC) 10-12.5 MG per tablet Take 1 tablet by mouth daily.  30 tablet  3  .  mupirocin ointment (BACTROBAN) 2 % Apply topically 2 (two) times daily. For 2 weeks  22 g  3  . DISCONTD: fluticasone (VERAMYST) 27.5 MCG/SPRAY nasal spray 2 sprays by Nasal route daily.  10 g  5  . DISCONTD: mupirocin ointment (BACTROBAN) 2 % Apply topically 2 (two) times daily. For 2 weeks  22 g  3  . ALPRAZolam (XANAX) 0.5 MG tablet One tablet daily as needed for anxiety  30 tablet  3  . gentamicin ointment (GARAMYCIN) 0.1 % Apply 1 application topically 3 (three) times daily.      Marland Kitchen DISCONTD: clindamycin (CLEOCIN T) 1 % external solution Apply 1 application topically 2 (two) times daily.         Orders Placed This Encounter  Procedures  . Basic metabolic panel  . Ambulatory referral to Vascular Surgery  Return in about 1 month (around 08/11/2012).

## 2012-07-12 NOTE — Patient Instructions (Addendum)
We are going to try adding alprazolam  As needed for your anxiety.  Start with 1/2 tablet  I agree with therapy.

## 2012-07-13 ENCOUNTER — Encounter: Payer: Self-pay | Admitting: Internal Medicine

## 2012-07-13 DIAGNOSIS — R0989 Other specified symptoms and signs involving the circulatory and respiratory systems: Secondary | ICD-10-CM | POA: Insufficient documentation

## 2012-07-13 NOTE — Assessment & Plan Note (Signed)
Anxiety only occurs at work and x-rays and episodes are grossly panic attacks. We discussed a trial of low-dose alprazolam. She finds it she is needing to use it on a daily basis we discussed starting an SSRI. She will return in one month. We also discussed the utility and seeing a therapist for talk therapy. She has seen Patricia [name] in the past and would like to resume therapy with her.

## 2012-07-13 NOTE — Assessment & Plan Note (Signed)
Well-controlled now on current medications. No changes today.

## 2012-07-13 NOTE — Assessment & Plan Note (Signed)
She's been experiencing a sensation of coldness in both legs when she has prolonged sitting. She does have decreased pulses and spent in this delayed cap refill in both feet. Refer to Dr. Fabienne Bruns in [place] per patient request, for evaluation with ankle-brachial indices and ultrasound if needed.

## 2012-07-13 NOTE — Assessment & Plan Note (Signed)
BMI has increased since last visit to 2 of weight gain. She is not exercising regularly. Given her concurrent anxiety I have strongly recommended she begin an exercise program. Th the obstacle to this really amounts to amounts to time management more than anything. I've also given her low carbohydrate diet to consider trying.

## 2012-07-15 ENCOUNTER — Other Ambulatory Visit: Payer: Self-pay

## 2012-07-15 DIAGNOSIS — R0989 Other specified symptoms and signs involving the circulatory and respiratory systems: Secondary | ICD-10-CM

## 2012-07-29 ENCOUNTER — Emergency Department: Payer: Self-pay | Admitting: Internal Medicine

## 2012-07-30 ENCOUNTER — Telehealth: Payer: Self-pay | Admitting: Internal Medicine

## 2012-07-30 MED ORDER — PREDNISONE (PAK) 10 MG PO TABS: ORAL_TABLET | ORAL | Status: DC

## 2012-07-30 NOTE — Telephone Encounter (Signed)
aller: [name]/Patient; Patient Name: Patricia [name]; PCP: Duncan Dull (Adults only); Best Callback Phone Number: [phone]; Reason for call: Other Pt is calling about numbness and tingling in her right arm (she has a hx of this) but yesterday it  extended into her right leg causing her right toe to be numb. Pt called the on call nurse last night and was advised to go to the ED. Pt went to the ED - the MD working there did a quick evaluation and then put her in the waiting room where she stayed 3 hours until 1:30 and then went home without being seen. she got the impression that it was not an emergency since there was no one left in the waiting room by the time she left, everyone else had been seen except her and maybe 2 other people . Pt has an upcoming visit this Thursday with a vascular MD. Pt is confused about what to do this am. She thinks that it's related to her neck and back surgeries but does not want to ignore anything else. Her BP is stable @ 120/80. She does not want to go back to the hospital. RN traiged/911 disposition. PT refused. Wants to know from her Dr. what to do so she doesn't sit in the ED again unecessarily.  RN called the back line and sent an office note after speaking with office staff. Office will call pt back and advise.

## 2012-07-30 NOTE — Telephone Encounter (Signed)
Patient called. Prednisone pak sent to [facility] for radiculopathy  Known cervical and lumbar disk disease.

## 2012-07-31 ENCOUNTER — Encounter: Payer: Self-pay | Admitting: Vascular Surgery

## 2012-08-01 ENCOUNTER — Ambulatory Visit (INDEPENDENT_AMBULATORY_CARE_PROVIDER_SITE_OTHER): Payer: 59 | Admitting: *Deleted

## 2012-08-01 ENCOUNTER — Encounter: Payer: 59 | Admitting: Vascular Surgery

## 2012-08-01 ENCOUNTER — Telehealth: Payer: Self-pay

## 2012-08-01 ENCOUNTER — Ambulatory Visit (INDEPENDENT_AMBULATORY_CARE_PROVIDER_SITE_OTHER): Payer: 59 | Admitting: Vascular Surgery

## 2012-08-01 ENCOUNTER — Encounter: Payer: Self-pay | Admitting: Vascular Surgery

## 2012-08-01 VITALS — BP 118/73 | HR 90 | Resp 16 | Ht 65.0 in | Wt 215.0 lb

## 2012-08-01 DIAGNOSIS — R0989 Other specified symptoms and signs involving the circulatory and respiratory systems: Secondary | ICD-10-CM

## 2012-08-01 DIAGNOSIS — I70219 Atherosclerosis of native arteries of extremities with intermittent claudication, unspecified extremity: Secondary | ICD-10-CM | POA: Insufficient documentation

## 2012-08-01 DIAGNOSIS — R209 Unspecified disturbances of skin sensation: Secondary | ICD-10-CM

## 2012-08-01 DIAGNOSIS — R200 Anesthesia of skin: Secondary | ICD-10-CM | POA: Insufficient documentation

## 2012-08-01 MED ORDER — FLUTICASONE PROPIONATE 50 MCG/ACT NA SUSP: 2.0000 | Freq: Every day | NASAL | Status: DC

## 2012-08-01 NOTE — Telephone Encounter (Signed)
[facility] called and stated that Patricia [name] is not covered by pts insurance and would like to know if this could be changed to generic Flonase? Please advise? Callback [phone]

## 2012-08-01 NOTE — Progress Notes (Signed)
[facility] HISTORY AND PHYSICAL   History of Present Illness:  Patient is a 51 y.o. year old female who presents for evaluation of bilateral coolness in her feet. She denies claudication symptoms. However she admits that she does not walk very much. Her walking is limited by back problems. She has had 2 prior back operations. She also has some occasional numbness and tingling in her first and second toes. This is improved recently. She denies rest pain. She has also had some numbness and tingling in her right arm and is under evaluation by Dr. Trey Sailors for possible cervical spine operation. She denies history of tobacco abuse. Her only raw atherosclerotic risk factors include peripheral arterial disease in her mother hypertension and obesity. She denies history of elevated cholesterol or diabetes. Other medical problems include reflux, depression, anxiety, attention deficit disorder all of which are currently stable.  Past Medical History  Diagnosis Date  . Obesity   . Hidradenitis suppurativa   . Hypertension   . Rosacea   . Degenerative disc disease   . Chronic sinusitis   . GERD (gastroesophageal reflux disease)   . Depression   . Anxiety   . Allergic rhinitis   . ADD (attention deficit disorder)   . Gastritis     Past Surgical History  Procedure Date  . Cesarean section Feb. 1995  . Tonsillectomy   . Anterior cruciate ligament repair   . Pilonidal cyst drainage   . Upper gastrointestinal endoscopy 01/26/2009    Gastritis, GERD  . Spinal fusion 03/30/2009    L4 L5 S1 Lumbar  . Spinal fusion Feb. 1997    Lumbar fusion  . Joint replacement Sept. 1993    Right knee ACL replacement     Social History History  Substance Use Topics  . Smoking status: Never Smoker   . Smokeless tobacco: Never Used  . Alcohol Use: 2.4 oz/week    4 Glasses of wine per week     occ     Family History Family History  Problem Relation Age of Onset  . Aneurysm  Mother     2 abdominal aneurysms  . Hyperlipidemia Mother   . Hypertension Mother   . Heart disease Mother   . Stroke Mother     small stroke, x's  aneurysm   . Esophageal cancer Father 30  . Cancer Father     esophageal CA,  tobacco user  . Mental retardation Father   . Hypertension Sister   . Obesity Sister   . Hypertension Brother   . Hyperlipidemia Brother   . ADD / ADHD Son   . Breast cancer Maternal Aunt   . Colon cancer Maternal Grandmother   . Cancer Maternal Grandmother     colon Ca  . Cancer Maternal Uncle     colon     Allergies  Allergies  Allergen Reactions  . Amphetamine-Dextroamphetamine     REACTION: not effective  . Bupropion Hcl     REACTION: irritability  . Celecoxib     REACTION: edema  . Fluticasone Propionate     REACTION: not effective  . Septra (Sulfamethoxazole W-Trimethoprim) Swelling  . Sertraline Hcl     REACTION: wt gain     Current Outpatient Prescriptions  Medication Sig Dispense Refill  . ALPRAZolam (XANAX) 0.5 MG tablet One tablet daily as needed for anxiety  30 tablet  3  . dexlansoprazole (DEXILANT) 60 MG capsule Take 1 capsule (60 mg total) by mouth  daily. Take 30 minutes before breakfast  30 capsule  11  . fluticasone (VERAMYST) 27.5 MCG/SPRAY nasal spray Place 2 sprays into the nose daily.  10 g  5  . HYDROcodone-acetaminophen (NORCO) 10-325 MG per tablet 1-2 tablets every 8 hours as needed for pain      . lisdexamfetamine (VYVANSE) 70 MG capsule Take 60 mg by mouth every morning. May refill on or after August 28      . lisinopril-hydrochlorothiazide (PRINZIDE,ZESTORETIC) 10-12.5 MG per tablet Take 1 tablet by mouth daily.  30 tablet  3  . mupirocin ointment (BACTROBAN) 2 % Apply topically 2 (two) times daily. For 2 weeks  22 g  3  . predniSONE (STERAPRED UNI-PAK) 10 MG tablet 6 tablets on Day 1 , then reduce by 1 tablet daily until gone  21 tablet  0  . DISCONTD: lisdexamfetamine (VYVANSE) 70 MG capsule Take 1 capsule (70 mg  total) by mouth every morning. May refill on or after August 28  30 capsule  0  . gentamicin ointment (GARAMYCIN) 0.1 % Apply 1 application topically 3 (three) times daily.        ROS:   General:  No weight loss, Fever, chills  HEENT: No recent headaches, no nasal bleeding, no visual changes, no sore throat  Neurologic: No dizziness, blackouts, seizures. No recent symptoms of stroke or mini- stroke. No recent episodes of slurred speech, or temporary blindness.  Cardiac: No recent episodes of chest pain/pressure, no shortness of breath at rest.  + shortness of breath with exertion.  Denies history of atrial fibrillation or irregular heartbeat  Vascular: No history of rest pain in feet.  No history of claudication.  No history of non-healing ulcer, No history of DVT   Pulmonary: No home oxygen, no productive cough, no hemoptysis,  No asthma or wheezing  Musculoskeletal:  [x ] Arthritis, [x ] Low back pain,  [x ] Joint pain  Hematologic:No history of hypercoagulable state.  No history of easy bleeding.  No history of anemia  Gastrointestinal: No hematochezia or melena,  + gastroesophageal reflux, no trouble swallowing  Urinary: [ ]  chronic Kidney disease, [ ]  on HD - [ ]  MWF or [ ]  TTHS, [ ]  Burning with urination, [ ]  Frequent urination, [ ]  Difficulty urinating;   Skin: No rashes  Psychological: + history of anxiety,  + history of depression   Physical Examination  Filed Vitals:   08/01/12 1124  BP: 118/73  Pulse: 90  Resp: 16  Height: 5\' 5"  (1.651 m)  Weight: 215 lb (97.523 kg)  SpO2: 96%    Body mass index is 35.78 kg/(m^2).  General:  Alert and oriented, no acute distress HEENT: Normal Neck: No bruit or JVD Pulmonary: Clear to auscultation bilaterally Cardiac: Regular Rate and Rhythm without murmur Abdomen: Soft, non-tender, non-distended, no mass Skin: No rash Extremity Pulses:  2+ radial, brachial, femoral, absent dorsalis pedis bilaterally, 1+ posterior tibial  pulses bilaterally Musculoskeletal: No deformity or edema  Neurologic: Upper and lower extremity motor 5/5 and symmetric  DATA: The patient had bilateral ABIs performed today. I reviewed and interpreted this study. Her ABIs were normal bilaterally with triphasic waveforms.   ASSESSMENT: Coolness both feet most likely neuro sympathetic in origin either related to her prior back problems or normal variant, her pulses may be difficult to palpate due to 2 small vessels her ABIs are completely normal. She has minimal atherosclerotic risk factors.     PLAN:  The patient will followup on  as-needed basis. She was encouraged to walk 30 minutes daily to help with weight loss. She has followup scheduled with Dr. Tia Masker in the near future to address her neck problems.  Fabienne Bruns, MD [facility] Office: [phone] Pager: [phone]

## 2012-08-01 NOTE — Telephone Encounter (Signed)
Yes,  Epic updated and rx emailed to [facility]

## 2012-08-05 ENCOUNTER — Other Ambulatory Visit: Payer: Self-pay | Admitting: Internal Medicine

## 2012-08-05 ENCOUNTER — Telehealth: Payer: Self-pay | Admitting: Internal Medicine

## 2012-08-05 MED ORDER — LISDEXAMFETAMINE DIMESYLATE 70 MG PO CAPS: 70.0000 mg | ORAL_CAPSULE | ORAL | Status: DC

## 2012-08-05 NOTE — Telephone Encounter (Signed)
Pt called stating she left voice message on traige line about rx  vyvanse [facility] is complete out of this med

## 2012-08-12 ENCOUNTER — Other Ambulatory Visit: Payer: Self-pay | Admitting: Internal Medicine

## 2012-08-12 MED ORDER — LISDEXAMFETAMINE DIMESYLATE 60 MG PO CAPS: 60.0000 mg | ORAL_CAPSULE | ORAL | Status: DC

## 2012-08-13 ENCOUNTER — Ambulatory Visit (INDEPENDENT_AMBULATORY_CARE_PROVIDER_SITE_OTHER): Payer: 59 | Admitting: Internal Medicine

## 2012-08-13 ENCOUNTER — Encounter: Payer: Self-pay | Admitting: Internal Medicine

## 2012-08-13 VITALS — BP 124/82 | HR 88 | Temp 98.1°F | Wt 217.5 lb

## 2012-08-13 DIAGNOSIS — F411 Generalized anxiety disorder: Secondary | ICD-10-CM

## 2012-08-13 DIAGNOSIS — E669 Obesity, unspecified: Secondary | ICD-10-CM

## 2012-08-13 DIAGNOSIS — F988 Other specified behavioral and emotional disorders with onset usually occurring in childhood and adolescence: Secondary | ICD-10-CM

## 2012-08-13 MED ORDER — DEXLANSOPRAZOLE 30 MG PO CPDR: 30.0000 mg | DELAYED_RELEASE_CAPSULE | Freq: Every day | ORAL | Status: DC

## 2012-08-13 NOTE — Progress Notes (Signed)
Patient ID: [name], female   DOB: 03/15/1961, 51 y.o.   MRN: [id number]  Patient Active Problem List  Diagnosis  . OBESITY  . ANXIETY  . DEPRESSION  . ATTENTION DEFICIT DISORDER  . ESSENTIAL HYPERTENSION  . ALLERGIC RHINITIS  . GERD  . EXCESSIVE OR FREQUENT MENSTRUATION  . IRREGULAR MENSTRUAL CYCLE  . ROSACEA  . DEGENERATIVE DISC DISEASE  . TRANSAMINASES, SERUM, ELEVATED  . Routine general medical examination at a health care facility  . Gynecologic exam normal  . Other screening mammogram  . Skin lesion  . Laryngopharyngeal reflux (LPR)  . Hidradenitis suppurativa of left axilla  . Upper respiratory infection  . MRSA nasal colonization  . Decreased pulses in feet  . Atherosclerosis of native arteries of the extremities with intermittent claudication  . Numbness and tingling of right arm    Subjective:  CC:   Chief Complaint  Patient presents with  . Follow-up    HPI:   Patricia [name] a 51 y.o. female who presents for follow up on generalized anxiety and concurrent attention deficit disorder. Her anxiety predates her use of stimulants for ADD. However her anxiety has been aggravated by recent  Vyvanse increased dose of 70 mg .    Anxiety brought on by work stressors. She feels that she tends overthink everything neck creates escalation of her symptoms of anxiety . Anxiety occurs even on days she does not work and is precipitated by thinking about matters at work..  Had a bad day on Sunday. Feels like her boss is trying to push her out.  She has a history of prior psychiatric evaluation but did not tolerate prior medication trial by Dr. Imogene Burn due to aggravation early morning awakenings. She has made contact with Kathrin Ruddy a therapist that she saw previously but has not heard back from her yet. She has been to a lot traveling for work which she cites as an impediment to regular exercise. She is reluctant to resume zoloft which has helped her in the past, due to fear  of more weight gain.    Past Medical History  Diagnosis Date  . Obesity   . Hidradenitis suppurativa   . Hypertension   . Rosacea   . Degenerative disc disease   . Chronic sinusitis   . GERD (gastroesophageal reflux disease)   . Depression   . Anxiety   . Allergic rhinitis   . ADD (attention deficit disorder)   . Gastritis     Past Surgical History  Procedure Date  . Cesarean section Feb. 1995  . Tonsillectomy   . Anterior cruciate ligament repair   . Pilonidal cyst drainage   . Upper gastrointestinal endoscopy 01/26/2009    Gastritis, GERD  . Spinal fusion 03/30/2009    L4 L5 S1 Lumbar  . Spinal fusion Feb. 1997    Lumbar fusion  . Joint replacement Sept. 1993    Right knee ACL replacement    The following portions of the patient's history were reviewed and updated as appropriate: Allergies, current medications, and problem list.    Review of Systems:   12 Pt  review of systems was negative except those addressed in the HPI,     History   Social History  . Marital Status: Divorced    Spouse Name: N/A    Number of Children: N/A  . Years of Education: N/A   Occupational History  . Account executive    Social History Main Topics  . Smoking  status: Never Smoker   . Smokeless tobacco: Never Used  . Alcohol Use: 2.4 oz/week    4 Glasses of wine per week     occ   . Drug Use: No  . Sexually Active: Not on file   Other Topics Concern  . Not on file   Social History Narrative   Daily caffeine     Objective:  BP 124/82  Pulse 88  Temp 98.1 F (36.7 C) (Oral)  Wt 217 lb 8 oz (98.657 kg)  SpO2 94%  General appearance: alert, cooperative and appears stated age Ears: normal TM's and external ear canals both ears Throat: lips, mucosa, and tongue normal; teeth and gums normal Neck: no adenopathy, no carotid bruit, supple, symmetrical, trachea midline and thyroid not enlarged, symmetric, no tenderness/mass/nodules Back: symmetric, no curvature. ROM  normal. No CVA tenderness. Lungs: clear to auscultation bilaterally Heart: regular rate and rhythm, S1, S2 normal, no murmur, click, rub or gallop Abdomen: soft, non-tender; bowel sounds normal; no masses,  no organomegaly Pulses: 2+ and symmetric Skin: Skin color, texture, turgor normal. No rashes or lesions Lymph nodes: Cervical, supraclavicular, and axillary nodes normal.  Assessment and Plan:  ATTENTION DEFICIT DISORDER ,managed with vyvanse.,  Did not tolerate increasd dose to 70 mg .  Reduced to 60 mg daily   ANXIETY She is using alprazolam as needed usually once or twice daily. I have encouraged her to consider resuming Zoloft given her need for daily alprazolam. Her fears of weight gain can be countered by making a time for regular exercise and dietary modifications.   OBESITY I have addressed her weight gain in the past with recommendations for low glycemic index diet and regular exercise. She has been unable to adopt any significant changes in behavior currently due to work stressors.   Updated Medication List Outpatient Encounter Prescriptions as of 08/13/2012  Medication Sig Dispense Refill  . ALPRAZolam (XANAX) 0.5 MG tablet One tablet daily as needed for anxiety  30 tablet  3  . dexlansoprazole (DEXILANT) 60 MG capsule Take 1 capsule (60 mg total) by mouth daily. Take 30 minutes before breakfast  30 capsule  11  . fluticasone (FLONASE) 50 MCG/ACT nasal spray Place 2 sprays into the nose daily.  16 g  6  . gentamicin ointment (GARAMYCIN) 0.1 % Apply 1 application topically 3 (three) times daily.      Marland Kitchen HYDROcodone-acetaminophen (NORCO) 10-325 MG per tablet 1-2 tablets every 8 hours as needed for pain      . lisdexamfetamine (VYVANSE) 60 MG capsule Take 1 capsule (60 mg total) by mouth every morning.  30 capsule  0  . lisinopril-hydrochlorothiazide (PRINZIDE,ZESTORETIC) 10-12.5 MG per tablet Take 1 tablet by mouth daily.  30 tablet  3  . mupirocin ointment (BACTROBAN) 2 %  Apply topically 2 (two) times daily. For 2 weeks  22 g  3  . DISCONTD: predniSONE (STERAPRED UNI-PAK) 10 MG tablet 6 tablets on Day 1 , then reduce by 1 tablet daily until gone  21 tablet  0  . Dexlansoprazole 30 MG capsule Take 1 capsule (30 mg total) by mouth daily.  30 capsule  5     No orders of the defined types were placed in this encounter.    Return in about 1 month (around 09/13/2012).

## 2012-08-13 NOTE — Assessment & Plan Note (Addendum)
She is using alprazolam as needed usually once or twice daily. I have encouraged her to consider resuming Zoloft given her need for daily alprazolam. Her fears of weight gain can be countered by making a time for regular exercise and dietary modifications.

## 2012-08-13 NOTE — Assessment & Plan Note (Addendum)
,  managed with vyvanse.,  Did not tolerate increasd dose to 70 mg .  Reduced to 60 mg daily

## 2012-08-13 NOTE — Telephone Encounter (Signed)
Patient was seen in office 08/13/12

## 2012-08-14 ENCOUNTER — Encounter: Payer: Self-pay | Admitting: Internal Medicine

## 2012-08-14 NOTE — Assessment & Plan Note (Signed)
I have addressed her weight gain in the past with recommendations for low glycemic index diet and regular exercise. She has been unable to adopt any significant changes in behavior currently due to work stressors.

## 2012-08-27 ENCOUNTER — Other Ambulatory Visit: Payer: Self-pay | Admitting: Neurosurgery

## 2012-08-27 DIAGNOSIS — M47812 Spondylosis without myelopathy or radiculopathy, cervical region: Secondary | ICD-10-CM

## 2012-09-02 ENCOUNTER — Ambulatory Visit
Admission: RE | Admit: 2012-09-02 | Discharge: 2012-09-02 | Disposition: A | Discharge status: Home / Self Care | Payer: 59 | Source: Ambulatory Visit | Attending: Neurosurgery | Admitting: Neurosurgery

## 2012-09-02 DIAGNOSIS — M47812 Spondylosis without myelopathy or radiculopathy, cervical region: Secondary | ICD-10-CM

## 2012-09-09 ENCOUNTER — Telehealth: Payer: Self-pay | Admitting: Internal Medicine

## 2012-09-09 NOTE — Telephone Encounter (Signed)
Caller: [name]/Patient; Patient Name: Patricia [name]; PCP: Duncan Dull (Adults only); Best Callback Phone Number: [phone]  Pt. calling to request refill of Vyvanse 60 mg.  She would like a 90 day supply - 3 Rx written and her pharmacist will hold the others until due. She currently has a 3 day supply left. PLEASE CALL HER WHEN READY TO PICK UP.

## 2012-09-11 ENCOUNTER — Other Ambulatory Visit: Payer: Self-pay

## 2012-09-11 MED ORDER — LISDEXAMFETAMINE DIMESYLATE 60 MG PO CAPS: 60.0000 mg | ORAL_CAPSULE | ORAL | Status: DC

## 2012-09-11 NOTE — Telephone Encounter (Signed)
3 rx's for vyvanse on printer

## 2012-09-24 ENCOUNTER — Other Ambulatory Visit: Payer: Self-pay | Admitting: Internal Medicine

## 2012-09-24 DIAGNOSIS — I10 Essential (primary) hypertension: Secondary | ICD-10-CM

## 2012-09-24 MED ORDER — LISDEXAMFETAMINE DIMESYLATE 70 MG PO CAPS: 70.0000 mg | ORAL_CAPSULE | ORAL | Status: DC

## 2012-09-24 NOTE — Telephone Encounter (Signed)
Patient notified to pick up Rx for Vyvance at the front desk.

## 2012-10-29 ENCOUNTER — Other Ambulatory Visit: Payer: Self-pay | Admitting: Internal Medicine

## 2012-10-29 NOTE — Telephone Encounter (Signed)
Med filled.  

## 2012-11-30 HISTORY — PX: HYSTEROSCOPY: SHX211

## 2012-12-14 ENCOUNTER — Other Ambulatory Visit: Payer: Self-pay

## 2012-12-18 ENCOUNTER — Other Ambulatory Visit: Payer: Self-pay | Admitting: Internal Medicine

## 2012-12-19 NOTE — Telephone Encounter (Signed)
Med filled.  

## 2013-01-14 ENCOUNTER — Ambulatory Visit: Payer: Self-pay | Admitting: Obstetrics & Gynecology

## 2013-01-14 LAB — CBC
HCT: 27 % — ABNORMAL LOW (ref 35.0–47.0)
HGB: 9.3 g/dL — ABNORMAL LOW (ref 12.0–16.0)
MCH: 31.6 pg (ref 26.0–34.0)
Platelet: 289 10*3/uL (ref 150–440)
RBC: 2.94 10*6/uL — ABNORMAL LOW (ref 3.80–5.20)

## 2013-01-14 LAB — POTASSIUM: Potassium: 3.8 mmol/L (ref 3.5–5.1)

## 2013-01-16 ENCOUNTER — Ambulatory Visit: Payer: Self-pay | Admitting: Obstetrics & Gynecology

## 2013-07-08 ENCOUNTER — Ambulatory Visit (INDEPENDENT_AMBULATORY_CARE_PROVIDER_SITE_OTHER): Payer: 59 | Admitting: Internal Medicine

## 2013-07-08 ENCOUNTER — Encounter: Payer: Self-pay | Admitting: Internal Medicine

## 2013-07-08 VITALS — BP 102/70 | HR 80 | Ht 65.0 in | Wt 234.0 lb

## 2013-07-08 DIAGNOSIS — K219 Gastro-esophageal reflux disease without esophagitis: Secondary | ICD-10-CM

## 2013-07-08 DIAGNOSIS — J387 Other diseases of larynx: Secondary | ICD-10-CM

## 2013-07-08 MED ORDER — DEXLANSOPRAZOLE 30 MG PO CPDR: 30.0000 mg | DELAYED_RELEASE_CAPSULE | Freq: Every day | ORAL | Status: DC

## 2013-07-08 NOTE — Assessment & Plan Note (Signed)
Resume PPI - Dexilant 60 mg daily samples x 1 month then 30 mg/day. She is to let me know if sxs on this. Explained that ? Of causality between PPI and osteoprosis not clear at all - and she really needs PPI.

## 2013-07-08 NOTE — Assessment & Plan Note (Addendum)
Go back on PPI -Dexilant She will send me recorrds of pathology from granuloma

## 2013-07-08 NOTE — Patient Instructions (Addendum)
We have given you samples of Dexilant.  We have also given you a written prescription to use when needed.  Follow up in 1 year with Dr Leone Payor.

## 2013-07-08 NOTE — Progress Notes (Signed)
  Subjective:    Patient ID: Patricia [name], female    DOB: 10-May-1961, 52 y.o.   MRN: [id number]  HPI The patient is here for follow-up. She has a hx of GERD with Laryngopharyngeal reflux. 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 Is followed by ENT in [place]. She tried to stop PPI (Dexilant) because of concerns about osteoporosis but is having heartburn and some sore throat since stopping. Needs new Rx. She did reduce dose to 30 mg daily and was ok.  Has gained some weight and is sedentary - working from home. Rare rectal bleeding - + external hemorrhoids at colonoscopy 2013.  Medications, allergies, past medical history, past surgical history, family history and social history are reviewed and updated in the EMR.  Review of Systems As above - recently tried phentermine for weight loss    Objective:   Physical Exam Obese, NAD     Assessment & Plan:  GERD (gastroesophageal reflux disease) - Plan: Dexlansoprazole (DEXILANT) 30 MG capsule  Laryngopharyngeal reflux (LPR)  FA:[id number], JOEL, MD and P. Scott Bennett,MD

## 2013-09-04 ENCOUNTER — Other Ambulatory Visit: Payer: Self-pay

## 2013-12-15 ENCOUNTER — Telehealth: Payer: Self-pay | Admitting: Internal Medicine

## 2013-12-15 ENCOUNTER — Encounter: Payer: Self-pay | Admitting: Internal Medicine

## 2013-12-15 DIAGNOSIS — K219 Gastro-esophageal reflux disease without esophagitis: Secondary | ICD-10-CM

## 2013-12-15 MED ORDER — DEXLANSOPRAZOLE 30 MG PO CPDR: 30.0000 mg | DELAYED_RELEASE_CAPSULE | Freq: Every day | ORAL | Status: DC

## 2013-12-15 NOTE — Progress Notes (Signed)
Patient ID: Patricia McgregorJanice F Hoots, female   DOB: [birth year], 53 y.o.   MRN: [id number] Patient came by to get samples of  Dexilant said she was on 30mg  but all we have are the 60mg  which she has taken in the past as well.  Per the 06/2013 office note the dose had been lowered due to concerns of osteoporosis and PPI use.  She wants to take the samples and reports she had been taking her 30 mg dexilant every other day lately to make them last due to lack of insurance.

## 2013-12-15 NOTE — Telephone Encounter (Signed)
Advised patient that dexilant samples up front for pick up.  She was very Adult nurseappreciative.

## 2014-02-11 ENCOUNTER — Telehealth: Payer: Self-pay

## 2014-02-11 NOTE — Telephone Encounter (Signed)
Filled out prior authorization form for Dexilant 30mg  that they faxed over.  Dx: GERD 530.81, Laryngopharyngeal reflux 478.79.  She has tried and failed Pantoprazole 40 mg, zantac 150mg , Nexium.  Faxed form to : [phone], will await approval.  This is a continuation of therapy for the Dexilant 30mg .

## 2014-02-16 ENCOUNTER — Encounter: Payer: Self-pay | Admitting: Internal Medicine

## 2014-02-18 ENCOUNTER — Telehealth: Payer: Self-pay

## 2014-02-18 NOTE — Telephone Encounter (Signed)
Prior authorization on Dexilant was denied thru [facility], did an appeal and it also was denied.  I informed patient and she said [facility] and she will investigate and find out if they are part of [facility] and if so what will they pay for.  She said she has loss 35 lbs in 8 weeks and her GERD symptoms are decreased .  She will call me back after talking with her insurance company.

## 2015-02-19 NOTE — Op Note (Signed)
PATIENT NAME:  Patricia [name], Freida F MR#:  [id number] DATE OF BIRTH:  1961/07/12  DATE OF PROCEDURE:  01/16/2013  PREOPERATIVE DIAGNOSIS: Menorrhagia.   POSTOPERATIVE DIAGNOSIS: Menorrhagia.   PROCEDURES:  1. Hysteroscopy.  2. Dilation and curettage.   SURGEON: Dierdre Searles. Paul Mithra Spano, MD  ANESTHESIA: General.   BLOOD LOSS: Minimal.   COMPLICATIONS: None.   FINDINGS: Thickened endometrial lining.   DISPOSITION: To recovery room, stable.   TECHNIQUE: The patient is prepped and draped in the usual sterile fashion after adequate anesthesia is obtained in the dorsal lithotomy position. The bladder is drained with a Robinson catheter. A speculum was placed, and the anterior lip of the cervix was grasped with a tenaculum. The uterus sounded to 10 cm and is already appropriately dilated. A 30-degree hysteroscope with glycine distention of the intrauterine cavity is performed with the above-mentioned findings visualized. There were no obvious fibroids or polyps visualized. There were no injuries to the uterus from the procedure. The hysteroscope is removed, and a D and C is performed, with obtaining endocervical and endometrial curettage material. Repeat hysteroscopy was performed for visualization of the lining of the cavity. Adequate D and C is judged to be successful. There is an 80 mL discrepancy of glycine fluid at the conclusion of the case. Tenaculum is removed, with excellent hemostasis noted. The patient goes to the recovery room in stable condition. All sponge, instrument and needle counts are correct.    ____________________________ R. Annamarie MajorPaul Ranjit Ashurst, MD rph:OSi D: 01/16/2013 13:01:49 ET T: 01/16/2013 13:19:55 ET JOB#: [id number]  cc: Dierdre Searles. Paul Carys Malina, MD, <Dictator> Nadara MustardOBERT P Jalina Blowers MD ELECTRONICALLY SIGNED 01/16/2013 23:35

## 2015-06-15 ENCOUNTER — Ambulatory Visit (INDEPENDENT_AMBULATORY_CARE_PROVIDER_SITE_OTHER): Payer: No Typology Code available for payment source | Admitting: Gastroenterology

## 2015-06-15 ENCOUNTER — Encounter: Payer: Self-pay | Admitting: Gastroenterology

## 2015-06-15 VITALS — BP 130/84 | HR 84 | Ht 65.0 in | Wt 209.2 lb

## 2015-06-15 DIAGNOSIS — K625 Hemorrhage of anus and rectum: Secondary | ICD-10-CM

## 2015-06-15 DIAGNOSIS — R197 Diarrhea, unspecified: Secondary | ICD-10-CM | POA: Diagnosis not present

## 2015-06-15 MED ORDER — METRONIDAZOLE 250 MG PO TABS
250.0000 mg | ORAL_TABLET | Freq: Three times a day (TID) | ORAL | Status: DC
Start: 2015-06-15 — End: 2015-09-03

## 2015-06-15 MED ORDER — HYDROCORTISONE ACETATE 25 MG RE SUPP: 25.0000 mg | Freq: Two times a day (BID) | RECTAL | Status: DC

## 2015-06-15 NOTE — Patient Instructions (Addendum)
We have sent the following medications to your pharmacy for you to pick up at your convenience: Flagyl Hydrocortisone Supp  Your physician has requested that you go to the basement for the following lab work before leaving today: Stool Culture and Stool C-diff  Please purchase the following medications over the counter and take as directed: Probiotic-Florastor twice a day  Please follow up in 2 weeks on 06-30-15 @ 1:30pm

## 2015-06-15 NOTE — Progress Notes (Signed)
     06/15/2015 Patricia [name] [id number] Mar 13, 1961   History of Present Illness:   This is a pleasant 54 year old female who is known to Dr. Leone Payor for treatment of her GERD and previous colonoscopy. Her colonoscopy was in March 2013 at which time she was found have only external hemorrhoids noted. Repeat was recommended in 10 years from that time. She presents to our office today with complaints of diarrhea and rectal bleeding. She says that in June she started rather abruptly with some diarrhea. Describes stools as anywhere from liquid to loose 3-5 times daily. Sometimes just very small amounts. She denies any travel or recent antibiotic use or bad food exposure. She does admit she's been under a lot of stress recently with her sick brother and being his power of attorney. Never had any issues with diarrhea in the past, however.   Current Medications, Allergies, Past Medical History, Past Surgical History, Family History and Social History were reviewed in [employer] record.   Physical Exam: BP 130/84 mmHg  Pulse 84  Ht  (1.651 m)  Wt 209 lb 3.2 oz (94.892 kg)  BMI 34.81 kg/m2 General: Well developed white female in no acute distress Head: Normocephalic and atraumatic Eyes:  Sclerae anicteric, conjunctiva pink  Ears: Normal auditory acuity Lungs: Clear throughout to auscultation Heart: Regular rate and rhythm Abdomen: Soft, non-distended.  Normal bowel sounds.  Non-tender. Rectal:  Some small external hemorrhoids noted Musculoskeletal: Symmetrical with no gross deformities  Extremities: No edema  Neurological: Alert oriented x 4, grossly nonfocal Psychological:  Alert and cooperative. Normal mood and affect  Assessment and Recommendations: -Diarrhea:  Began abruptly.  ? Infectious source.  Will check stool for Cdiff and culture for enteric pathogens.  Assuming that these will be negative, will begin empiric course of flagyl 250 mg TID for 7 days.  Also  will begin taking Florastor twice daily.   -Rectal bleeding:  Internal hemorrhoids noted with blood in anal vault on anoscopy.  Will treat with hydrocortisone suppositories twice daily for 10 days.  *Will follow-up in 2 weeks.

## 2015-06-16 ENCOUNTER — Other Ambulatory Visit: Payer: No Typology Code available for payment source

## 2015-06-16 DIAGNOSIS — K625 Hemorrhage of anus and rectum: Secondary | ICD-10-CM

## 2015-06-16 DIAGNOSIS — R197 Diarrhea, unspecified: Secondary | ICD-10-CM

## 2015-06-20 LAB — STOOL CULTURE

## 2015-06-21 NOTE — Progress Notes (Signed)
Agree with Ms. Zehr's management.  Carl E. Gessner, MD, FACG  

## 2015-06-23 ENCOUNTER — Telehealth: Payer: Self-pay | Admitting: Gastroenterology

## 2015-06-23 NOTE — Telephone Encounter (Signed)
Called and spoke with [facility] and the c. Diff stool sample was too formed for the test. Please, advise as to what to tell patient re: culture and what she should do next.

## 2015-06-23 NOTE — Telephone Encounter (Signed)
Let patient know that culture was negative.  I placed her on flagyl empirically so have her complete that and take the probiotics as I recommended.  She has a follow-up appt with me next week.  Thank you,  Jess

## 2015-06-23 NOTE — Telephone Encounter (Signed)
Left a message for patient to call back. 

## 2015-06-24 LAB — CLOSTRIDIUM DIFFICILE BY PCR

## 2015-06-24 NOTE — Telephone Encounter (Signed)
Patient notified of results and recommendations.

## 2015-06-30 ENCOUNTER — Ambulatory Visit (INDEPENDENT_AMBULATORY_CARE_PROVIDER_SITE_OTHER): Payer: No Typology Code available for payment source | Admitting: Gastroenterology

## 2015-06-30 ENCOUNTER — Encounter: Payer: Self-pay | Admitting: Gastroenterology

## 2015-06-30 VITALS — BP 120/72 | HR 84 | Ht 65.0 in | Wt 208.5 lb

## 2015-06-30 DIAGNOSIS — R197 Diarrhea, unspecified: Secondary | ICD-10-CM | POA: Diagnosis not present

## 2015-06-30 DIAGNOSIS — K649 Unspecified hemorrhoids: Secondary | ICD-10-CM | POA: Diagnosis not present

## 2015-06-30 DIAGNOSIS — K625 Hemorrhage of anus and rectum: Secondary | ICD-10-CM | POA: Diagnosis not present

## 2015-06-30 NOTE — Progress Notes (Signed)
     06/30/2015 Patricia [name] [id number] 06-04-1961   History of Present Illness:  Patient is here today for follow-up of her diarrhea and rectal bleeding.  Was empirically treated for infectious source with Flagyl for 7 days for possible infectious source vs SIBO and she was given hydrocortisone suppositories BID for bleeding internal hemorrhoids.  Both of those have helped.  Having one stool per day that is formed.  Rectal bleeding minimal to none for the past several days and is still using the suppositories currently.  Feels much better.  Current Medications, Allergies, Past Medical History, Past Surgical History, Family History and Social History were reviewed in [employer] record.   Physical Exam: BP 120/72 mmHg  Pulse 84  Ht  (1.651 m)  Wt 208 lb 8 oz (94.575 kg)  BMI 34.70 kg/m2 General: Well developed female in no acute distress Head: Normocephalic and atraumatic Eyes:  Sclerae anicteric, conjunctiva pink  Ears: Normal auditory acuity Lungs: Clear throughout to auscultation Heart: Regular rate and rhythm Abdomen: Soft, non-distended.  Normal bowel sounds.  Non-tender. Musculoskeletal: Symmetrical with no gross deformities  Extremities: No edema  Neurological: Alert oriented x 4, grossly non-focal Psychological:  Alert and cooperative. Normal mood and affect  Assessment and Recommendations: -Diarrhea:  Began abruptly.  ? Infectious source.  Stool for Cdiff and culture for enteric pathogens negative.  Improved/resolved with empiric course of Flagyl. -Rectal bleeding:  Improved/resolved with hydrocortisone suppositories.  *Follow-up prn.

## 2015-06-30 NOTE — Patient Instructions (Signed)
Follow as needed

## 2015-07-06 NOTE — Progress Notes (Signed)
Agree with Ms. Zehr's management.  Dametra Whetsel E. Isabella Roemmich, MD, FACG  

## 2015-09-03 ENCOUNTER — Encounter: Payer: Self-pay | Admitting: Family Medicine

## 2015-09-03 ENCOUNTER — Ambulatory Visit (INDEPENDENT_AMBULATORY_CARE_PROVIDER_SITE_OTHER): Payer: No Typology Code available for payment source | Admitting: Family Medicine

## 2015-09-03 VITALS — BP 118/76 | HR 85 | Temp 98.3°F | Ht 65.0 in | Wt 219.2 lb

## 2015-09-03 DIAGNOSIS — L732 Hidradenitis suppurativa: Secondary | ICD-10-CM

## 2015-09-03 DIAGNOSIS — Z1231 Encounter for screening mammogram for malignant neoplasm of breast: Secondary | ICD-10-CM

## 2015-09-03 DIAGNOSIS — F32A Depression, unspecified: Secondary | ICD-10-CM

## 2015-09-03 DIAGNOSIS — F329 Major depressive disorder, single episode, unspecified: Secondary | ICD-10-CM | POA: Diagnosis not present

## 2015-09-03 NOTE — Progress Notes (Signed)
Pre visit review using our clinic review tool, if applicable. No additional management support is needed unless otherwise documented below in the visit note. 

## 2015-09-03 NOTE — Patient Instructions (Signed)
Nice to meet you. We will refer you to dermatology for your hidradenitis. We will order mammogram and ultrasound. Please call Dr. Suezanne JacquetShaner to set up an appointment for therapy. If you develop increased swelling in your armpit, redness, drainage, fever, chills, feeling poorly, thoughts of harming herself or others, or any new symptoms please seek medical attention.

## 2015-09-03 NOTE — Assessment & Plan Note (Signed)
Patient with a long history of depression. Notes recent worsening of this with her brother's illness. She does note she is in a good place in her life and feels like she wants to catch this early trying to prevent it from escalating. She denies any SI or HI. I did discuss medication treatment versus therapy and the benefit of both in combination. Patient opted for therapy. She is given the name of a therapist in the area and she will call them to set up an appointment. She is given return precautions.

## 2015-09-03 NOTE — Assessment & Plan Note (Addendum)
Patient appears to have hidradenitis of her left axilla. It appears that this is mostly scar tissue at this time. There are no areas of fluctuance or abscess formation. We discussed hygiene. We will have her see dermatology. We'll also obtain a mammogram and bilateral ultrasounds of axilla to ensure that there is no additional underlying pathology. She can use warm compresses. She'll continue to monitor. If there is any enlargement or abscess formation she'll follow-up in the office. She was given return precautions for this.

## 2015-09-03 NOTE — Progress Notes (Addendum)
Patient ID: Patricia McgregorJanice F [name], female   DOB: 06-24-1961, 54 y.o.   MRN: [id number]  Patricia AlarEric Sonnenberg, MD Phone: [phone]  Patricia McgregorJanice F [name] is a 54 y.o. female who presents today for new patient visit.  Depression: Patient notes intermittent depression since her mid-30s. She's tried treatment for this throughout the years. She intermittently gets worse and then gets better. She notes her brother's recent illnesses have caused her to reevaluate her depression and want treatment at this time. She notes particularly that her appetite is increased with this. She had lost 85 pounds last year though has gained a fair amount of it back. She has no history of SI or HI. No history of suicide attempts. She has been on Wellbutrin before this was not beneficial. She was on Zoloft at one time for about a year and notes this helped significantly, though she felt she didn't care about anything while she was on this. She notes recent return to Christianity to he has helped as well. PHQ 9 score 14  Left axilla hidradenitis: Patient notes many years of issues with this in her left axilla and occasionally in her bilateral groin. She notes the lesion that is presently there has come to the point where it is always open and always draining something. She denies any fevers with this. She notes they're typically painful and raised. She notes cleaning with soap or changing soaps has not made any difference. She has seen a dermatologist in the past though not in many years.  Active Ambulatory Problems    Diagnosis Date Noted  . OBESITY 10/28/2008  . ANXIETY 02/21/2007  . Depression 02/21/2007  . ATTENTION DEFICIT DISORDER 09/01/2009  . ESSENTIAL HYPERTENSION 10/09/2007  . ALLERGIC RHINITIS 02/21/2007  . GERD w/ Laryngopharyngeal reflux 02/21/2007  . EXCESSIVE OR FREQUENT MENSTRUATION 06/18/2008  . IRREGULAR MENSTRUAL CYCLE 05/14/2008  . ROSACEA 02/21/2007  . DEGENERATIVE DISC DISEASE 02/21/2007  . TRANSAMINASES, SERUM,  ELEVATED 02/24/2008  . Routine general medical examination at a health care facility 02/08/2011  . Gynecologic exam normal 02/14/2011  . Other screening mammogram 02/14/2011  . Skin lesion 08/29/2011  . Laryngopharyngeal reflux (LPR) 08/29/2011  . Hidradenitis suppurativa of left axilla 04/03/2012  . MRSA nasal colonization 06/15/2012  . Decreased pulses in feet 07/13/2012  . Atherosclerosis of native arteries of the extremities with intermittent claudication 08/01/2012  . Numbness and tingling of right arm 08/01/2012  . Diarrhea 06/15/2015  . Rectal bleeding 06/15/2015  . Hemorrhoid 06/30/2015   Resolved Ambulatory Problems    Diagnosis Date Noted  . LEUKOCYTOSIS 10/14/2007  . SINUSITIS, CHRONIC 02/21/2007  . HIDRADENITIS SUPPURATIVA 06/18/2008  . Upper respiratory infection 06/15/2012   Past Medical History  Diagnosis Date  . Obesity   . Hidradenitis suppurativa   . Hypertension   . Degenerative disc disease   . Chronic sinusitis   . GERD (gastroesophageal reflux disease)   . Anxiety   . Allergic rhinitis   . ADD (attention deficit disorder)   . Gastritis     Family History  Problem Relation Age of Onset  . Aneurysm Mother     2 abdominal aneurysms  . Hyperlipidemia Mother   . Hypertension Mother   . Heart disease Mother   . Stroke Mother     small stroke, x's  aneurysm   . Esophageal cancer Father [id number]  . Cancer Father     esophageal CA,  tobacco user  . Mental retardation Father   . Hypertension Sister   . Obesity  Sister   . Hypertension Brother   . Hyperlipidemia Brother   . ADD / ADHD Son   . Breast cancer Maternal Aunt   . Colon cancer Maternal Grandmother   . Colon cancer Maternal Grandmother   . Colon cancer Maternal Uncle     Social History   Social History  . Marital Status: Divorced    Spouse Name: N/A  . Number of Children: N/A  . Years of Education: N/A   Occupational History  . account executive    Social History Main Topics  .  Smoking status: Never Smoker   . Smokeless tobacco: Never Used  . Alcohol Use: 2.4 oz/week    4 Glasses of wine per week     Comment: occ   . Drug Use: No  . Sexual Activity: Not on file   Other Topics Concern  . Not on file   Social History Narrative   Daily caffeine     ROS   General:  Negative for nexplained weight loss, fever Skin: Negative for new or changing mole, sore that won't heal HEENT: Positive for trouble seeing (squinting) Negative for trouble hearing, ringing in ears, mouth sores, hoarseness, change in voice, dysphagia. CV:  Negative for chest pain, dyspnea, edema, palpitations Resp: Negative for cough, dyspnea, hemoptysis GI: Negative for nausea, vomiting, diarrhea, constipation, abdominal pain, melena, hematochezia. GU: Negative for dysuria, incontinence, urinary hesitance, hematuria, vaginal or penile discharge, polyuria, sexual difficulty, lumps in testicle or breasts MSK: Negative for muscle cramps or aches, joint pain or swelling Neuro: Negative for headaches, weakness, numbness, dizziness, passing out/fainting Psych: Negative for depression, anxiety, memory problems Skin: Positive for sore that won't heal  Objective  Physical Exam Filed Vitals:   09/03/15 0759  BP: 118/76  Pulse: 85  Temp: 98.3 F (36.8 C)    Physical Exam  Constitutional: She is well-developed, well-nourished, and in no distress.  HENT:  Head: Normocephalic and atraumatic.  Right Ear: External ear normal.  Left Ear: External ear normal.  Mouth/Throat: Oropharynx is clear and moist. No oropharyngeal exudate.  Eyes: Conjunctivae are normal. Pupils are equal, round, and reactive to light.  Neck: Neck supple.  Cardiovascular: Normal rate, regular rhythm and normal heart sounds.  Exam reveals no gallop and no friction rub.   No murmur heard. Pulmonary/Chest: Effort normal and breath sounds normal. No respiratory distress. She has no wheezes. She has no rales.  Abdominal: Soft.  Bowel sounds are normal. She exhibits no distension. There is no tenderness. There is no rebound and no guarding.  Musculoskeletal: She exhibits no edema.  Lymphadenopathy:    She has no cervical adenopathy.  Neurological: She is alert. Gait normal.  Skin: Skin is warm and dry. She is not diaphoretic.  Left axilla with 2-3 areas of nodular scar tissue noted to be likely hidradenitis, there is no drainage from these, there is no fluctuance, there is no induration, there is no surrounding or overlying erythema Right axilla with 2 areas of small amounts of scar tissue, no erythema, no fluctuance, no induration Left inguinal area with single pustule, no induration, no fluctuance, no erythema Right inguinal area with no appreciable lesions   Psychiatric: Affect normal.  Mood depressed   no masses palpated on breast exam.   Assessment/Plan:   Depression Patient with a long history of depression. Notes recent worsening of this with her brother's illness. She does note she is in a good place in her life and feels like she wants to catch this  early trying to prevent it from escalating. She denies any SI or HI. I did discuss medication treatment versus therapy and the benefit of both in combination. Patient opted for therapy. She is given the name of a therapist in the area and she will call them to set up an appointment. She is given return precautions.  Hidradenitis suppurativa of left axilla Patient appears to have hidradenitis of her left axilla. It appears that this is mostly scar tissue at this time. There are no areas of fluctuance or abscess formation. We discussed hygiene. We will have her see dermatology. We'll also obtain a mammogram and bilateral ultrasounds of axilla to ensure that there is no additional underlying pathology. She can use warm compresses. She'll continue to monitor. If there is any enlargement or abscess formation she'll follow-up in the office. She was given return  precautions for this.    Orders Placed This Encounter  Procedures  . MM Digital Screening    Standing Status: Future     Number of Occurrences:      Standing Expiration Date: 11/02/2016    Order Specific Question:  Reason for Exam (SYMPTOM  OR DIAGNOSIS REQUIRED)    Answer:  hidradenitis    Order Specific Question:  Is the patient pregnant?    Answer:  No    Order Specific Question:  Preferred imaging location?    Answer:  [facility]  . [place] [facility]    Standing Status: Future     Number of Occurrences:      Standing Expiration Date: 11/05/2016    Order Specific Question:  Reason for Exam (SYMPTOM  OR DIAGNOSIS REQUIRED)    Answer:  bilateral axillary hidradenitis    Order Specific Question:  Preferred imaging location?    Answer:  [facility]  . Ambulatory referral to Dermatology    Referral Priority:  Routine    Referral Type:  Consultation    Referral Reason:  Specialty Services Required    Requested Specialty:  Dermatology    Number of Visits Requested:  1   Patricia [name]

## 2015-09-06 ENCOUNTER — Encounter: Payer: Self-pay | Admitting: Family Medicine

## 2015-09-06 NOTE — Addendum Note (Signed)
Addended by: Glori LuisSONNENBERG, ERIC G on: 09/06/2015 08:11 AM   Modules accepted: Orders

## 2015-09-08 ENCOUNTER — Other Ambulatory Visit: Payer: Self-pay | Admitting: Family Medicine

## 2015-09-10 ENCOUNTER — Ambulatory Visit
Admission: RE | Admit: 2015-09-10 | Discharge: 2015-09-10 | Disposition: A | Discharge status: Home / Self Care | Payer: PRIVATE HEALTH INSURANCE | Source: Ambulatory Visit | Attending: Family Medicine | Admitting: Family Medicine

## 2015-09-10 DIAGNOSIS — R2233 Localized swelling, mass and lump, upper limb, bilateral: Secondary | ICD-10-CM | POA: Diagnosis present

## 2015-09-10 DIAGNOSIS — L732 Hidradenitis suppurativa: Secondary | ICD-10-CM

## 2015-09-13 ENCOUNTER — Encounter: Payer: Self-pay | Admitting: Family Medicine

## 2015-09-13 ENCOUNTER — Telehealth: Payer: Self-pay | Admitting: *Deleted

## 2015-09-13 MED ORDER — CITALOPRAM HYDROBROMIDE 20 MG PO TABS: 20.0000 mg | ORAL_TABLET | Freq: Every day | ORAL | Status: DC

## 2015-09-13 NOTE — Telephone Encounter (Signed)
Please advise 

## 2015-09-13 NOTE — Telephone Encounter (Signed)
Called and spoke with patient regarding starting medication for depression. We did discuss this at her last office visit. She noted she had done some research and was curious about Celexa. We discussed Celexa and noted difficult to call her ability and potential for medication reactions. She is only on Flonase at this time. She notes she would like to start medication in addition to therapy at this time she feels both will be more beneficial. She denies any SI or HI. I will send Celexa the pharmacy for her. Given return precautions. She'll follow-up in the office in 4 weeks.

## 2015-09-13 NOTE — Telephone Encounter (Signed)
Patient stated that she discussed with Dr. Birdie SonsSonnenberg about depression medication. Patient stated that she would like to follow through with this medication. Patient requested a call back to schedule an appointment if needed.

## 2015-09-15 ENCOUNTER — Ambulatory Visit: Payer: PRIVATE HEALTH INSURANCE

## 2015-10-05 ENCOUNTER — Ambulatory Visit (INDEPENDENT_AMBULATORY_CARE_PROVIDER_SITE_OTHER): Payer: No Typology Code available for payment source | Admitting: Family Medicine

## 2015-10-05 ENCOUNTER — Encounter: Payer: Self-pay | Admitting: Family Medicine

## 2015-10-05 VITALS — BP 118/76 | HR 79 | Temp 98.7°F | Ht 65.0 in | Wt 225.6 lb

## 2015-10-05 DIAGNOSIS — J0110 Acute frontal sinusitis, unspecified: Secondary | ICD-10-CM | POA: Diagnosis not present

## 2015-10-05 DIAGNOSIS — F32A Depression, unspecified: Secondary | ICD-10-CM

## 2015-10-05 DIAGNOSIS — F329 Major depressive disorder, single episode, unspecified: Secondary | ICD-10-CM | POA: Diagnosis not present

## 2015-10-05 MED ORDER — AMOXICILLIN-POT CLAVULANATE 875-125 MG PO TABS: 1.0000 | ORAL_TABLET | Freq: Two times a day (BID) | ORAL | Status: DC

## 2015-10-05 MED ORDER — FLUTICASONE FUROATE 27.5 MCG/SPRAY NA SUSP: 2.0000 | Freq: Every day | NASAL | Status: DC

## 2015-10-05 NOTE — Assessment & Plan Note (Addendum)
No noted difference yet, though it has only been 3 weeks on the medication. She has no SI or HI. We'll continue to monitor for the next 1-2 weeks on the current dose of medication and if she does not have any improvement she will send me a message through my chart we will discuss increasing her medication. She is given return precautions.

## 2015-10-05 NOTE — Progress Notes (Signed)
Patient ID: Patricia McgregorJanice F [name], female   DOB: 01-16-1961, 54 y.o.   MRN: [id number]  Marikay AlarEric Guiselle Mian, MD Phone: [phone]  Patricia [name] is a 54 y.o. female who presents today for same-day visit.  Sinus infection: Patient notes 1 and 1/2 weeks of frontal sinus pressure and blowing clear/blood streaked mucus out of her nose. She's had some right ear fullness with this. Mild amount of postnasal drip. Minimal cough. No history of shortness of breath. No vision changes, numbness, or weakness with this. Tried Afrin and this helped some early in the course. She notes she had improved until the last couple of days when she worsened again. She does have a history of allergies and has been taking Flonase that this is been ineffective.  Depression: Patient notes she has been taking Celexa for 3 weeks. She notes she has not noticed much of a difference yet. She notes she just does not have any joy in doing anything. She does not have any SI or HI. She wonders if this change in her mood could've been related to menopause. She notes she has an appointment with her gynecologist next week to discuss this.  PMH: nonsmoker.   ROS see history of present illness  Objective  Physical Exam Filed Vitals:   10/05/15 0752  BP: 118/76  Pulse: 79  Temp: 98.7 F (37.1 C)   Physical Exam  Constitutional: She is well-developed, well-nourished, and in no distress.  Normal TMs bilaterally, mild posterior oropharyngeal erythema, no exudate or tonsillar enlargement  HENT:  Head: Normocephalic and atraumatic.  Right Ear: External ear normal.  Left Ear: External ear normal.  Eyes: Conjunctivae are normal. Pupils are equal, round, and reactive to light.  Neck: Neck supple.  Cardiovascular: Normal rate, regular rhythm and normal heart sounds.  Exam reveals no gallop and no friction rub.   No murmur heard. Pulmonary/Chest: Effort normal and breath sounds normal. No respiratory distress. She has no wheezes. She has no  rales.  Lymphadenopathy:    She has no cervical adenopathy.  Neurological: She is alert. Gait normal.  Skin: Skin is warm and dry. She is not diaphoretic.     Assessment/Plan: Please see individual problem list.  Sinusitis, acute frontal Symptoms most consistent with bacterial sinusitis given mild improvement and then worsening and duration of symptoms. Could also have some measure of allergic rhinitis as a cause. Treat her with Augmentin for 1 week. We will change her from Flonase to [place] as this is worked in the past. She will monitor and if not improving in the next 2-3 days she'll let us know. She is given return precautions.  Depression No noted difference yet, though it has only been 3 weeks on the medication. She has no SI or HI. We'll continue to monitor for the next 1-2 weeks on the current dose of medication and if she does not have any improvement she will send me a message through my chart we will discuss increasing her medication. She is given return precautions.    Meds ordered this encounter  Medications  . amoxicillin-clavulanate (AUGMENTIN) 875-125 MG tablet    Sig: Take 1 tablet by mouth 2 (two) times daily.    Dispense:  14 tablet    Refill:  0  . fluticasone (VERAMYST) 27.5 MCG/SPRAY nasal spray    Sig: Place 2 sprays into the nose daily.    Dispense:  10 g    Refill:  12    Marikay AlarEric Namir Neto

## 2015-10-05 NOTE — Progress Notes (Signed)
Pre visit review using our clinic review tool, if applicable. No additional management support is needed unless otherwise documented below in the visit note. 

## 2015-10-05 NOTE — Patient Instructions (Signed)
Nice to see you. Your symptoms are likely related to a sinus infection. We will treat you with Augmentin for this. You should start on Veramyst as well. Please continue the Celexa. Monitor how he do. If you're not feeling any benefit in the next 1-2 weeks please let us know. If you develop fever, and reactive cough, shortness of breath, numbness, weakness, vision changes, thoughts of harming herself or others, or any new or change in symptoms please seek medical attention.

## 2015-10-05 NOTE — Assessment & Plan Note (Signed)
Symptoms most consistent with bacterial sinusitis given mild improvement and then worsening and duration of symptoms. Could also have some measure of allergic rhinitis as a cause. Treat her with Augmentin for 1 week. We will change her from Flonase to [place] as this is worked in the past. She will monitor and if not improving in the next 2-3 days she'll let us know. She is given return precautions.

## 2015-10-11 ENCOUNTER — Encounter: Payer: Self-pay | Admitting: Family Medicine

## 2015-10-12 ENCOUNTER — Other Ambulatory Visit: Payer: Self-pay | Admitting: Family Medicine

## 2015-10-12 MED ORDER — CITALOPRAM HYDROBROMIDE 10 MG PO TABS: 10.0000 mg | ORAL_TABLET | Freq: Every day | ORAL | Status: DC

## 2015-10-13 LAB — HM MAMMOGRAPHY

## 2015-10-25 ENCOUNTER — Emergency Department
Admission: EM | Admit: 2015-10-25 | Discharge: 2015-10-25 | Disposition: A | Discharge status: Home / Self Care | Payer: PRIVATE HEALTH INSURANCE | Attending: Emergency Medicine | Admitting: Emergency Medicine

## 2015-10-25 DIAGNOSIS — X58XXXA Exposure to other specified factors, initial encounter: Secondary | ICD-10-CM | POA: Diagnosis not present

## 2015-10-25 DIAGNOSIS — S199XXA Unspecified injury of neck, initial encounter: Secondary | ICD-10-CM | POA: Diagnosis present

## 2015-10-25 DIAGNOSIS — Z79899 Other long term (current) drug therapy: Secondary | ICD-10-CM | POA: Diagnosis not present

## 2015-10-25 DIAGNOSIS — M436 Torticollis: Secondary | ICD-10-CM | POA: Diagnosis not present

## 2015-10-25 DIAGNOSIS — Z792 Long term (current) use of antibiotics: Secondary | ICD-10-CM | POA: Diagnosis not present

## 2015-10-25 DIAGNOSIS — S161XXA Strain of muscle, fascia and tendon at neck level, initial encounter: Secondary | ICD-10-CM | POA: Insufficient documentation

## 2015-10-25 DIAGNOSIS — Y9289 Other specified places as the place of occurrence of the external cause: Secondary | ICD-10-CM | POA: Insufficient documentation

## 2015-10-25 DIAGNOSIS — Y998 Other external cause status: Secondary | ICD-10-CM | POA: Insufficient documentation

## 2015-10-25 DIAGNOSIS — Y9389 Activity, other specified: Secondary | ICD-10-CM | POA: Diagnosis not present

## 2015-10-25 MED ORDER — CYCLOBENZAPRINE HCL 5 MG PO TABS: 5.0000 mg | ORAL_TABLET | Freq: Three times a day (TID) | ORAL | Status: DC | PRN

## 2015-10-25 NOTE — Discharge Instructions (Signed)
Acute Torticollis Torticollis is a condition in which the muscles of the neck tighten (contract) abnormally, causing the neck to twist and the head to move into an unnatural position. Torticollis that develops suddenly is called acute torticollis. If torticollis becomes chronic and is left untreated, the face and neck can become deformed. CAUSES This condition may be caused by: Cervical Sprain A cervical sprain is when the tissues (ligaments) that hold the neck bones in place stretch or tear. HOME CARE   Put ice on the injured area.  Put ice in a plastic bag.  Place a towel between your skin and the bag.  Leave the ice on for 15-20 minutes, 3-4 times a day.  You may have been given a collar to wear. This collar keeps your neck from moving while you heal.  Do not take the collar off unless told by your doctor.  If you have long hair, keep it outside of the collar.  Ask your doctor before changing the position of your collar. You may need to change its position over time to make it more comfortable.  If you are allowed to take off the collar for cleaning or bathing, follow your doctor's instructions on how to do it safely.  Keep your collar clean by wiping it with mild soap and water. Dry it completely. If the collar has removable pads, remove them every 1-2 days to hand wash them with soap and water. Allow them to air dry. They should be dry before you wear them in the collar.  Do not drive while wearing the collar.  Only take medicine as told by your doctor.  Keep all doctor visits as told.  Keep all physical therapy visits as told.  Adjust your work station so that you have good posture while you work.  Avoid positions and activities that make your problems worse.  Warm up and stretch before being active. GET HELP IF:  Your pain is not controlled with medicine.  You cannot take less pain medicine over time as planned.  Your activity level does not improve as  expected. GET HELP RIGHT AWAY IF:   You are bleeding.  Your stomach is upset.  You have an allergic reaction to your medicine.  You develop new problems that you cannot explain.  You lose feeling (become numb) or you cannot move any part of your body (paralysis).  You have tingling or weakness in any part of your body.  Your symptoms get worse. Symptoms include:  Pain, soreness, stiffness, puffiness (swelling), or a burning feeling in your neck.  Pain when your neck is touched.  Shoulder or upper back pain.  Limited ability to move your neck.  Headache.  Dizziness.  Your hands or arms feel week, lose feeling, or tingle.  Muscle spasms.  Difficulty swallowing or chewing. MAKE SURE YOU:   Understand these instructions.  Will watch your condition.  Will get help right away if you are not doing well or get worse.   This information is not intended to replace advice given to you by your health care provider. Make sure you discuss any questions you have with your health care provider.   Document Released: 04/03/2008 Document Revised: 06/18/2013 Document Reviewed: 04/23/2013 Elsevier Interactive Patient Education 2016 [employer]   Sleeping in an awkward position (common).  Extending or twisting the neck muscles beyond their normal position.  Infection. In some cases, the cause may not be known. SYMPTOMS Symptoms of this condition include:  An unnatural position  of the head.  Neck pain.  A limited ability to move the neck.  Twisting of the neck to one side. DIAGNOSIS This condition is diagnosed with a physical exam. You may also have imaging tests, such as an X-ray, CT scan, or MRI. TREATMENT Treatment for this condition involves trying to relax the neck muscles. It may include:  Medicines or shots.  Physical therapy.  Surgery. This may be done in severe cases. HOME CARE INSTRUCTIONS  Take medicines only as directed by your health care  provider.  Do stretching exercises and massage your neck as directed by your health care provider.  Keep all follow-up visits as directed by your health care provider. This is important. SEEK MEDICAL CARE IF:  You develop a fever. SEEK IMMEDIATE MEDICAL CARE IF:  You develop difficulty breathing.  You develop noisy breathing (stridor).  You start drooling.  You have trouble swallowing or have pain with swallowing.  You develop numbness or weakness in your hands or feet.  You have changes in your speech, understanding, or vision.  Your pain gets worse.   This information is not intended to replace advice given to you by your health care provider. Make sure you discuss any questions you have with your health care provider.   Document Released: 10/13/2000 Document Revised: 03/02/2015 Document Reviewed: 10/12/2014 Elsevier Interactive Patient Education [employer]   Your exam is normal today. There is no unusual finding on your exam. You should take the prescription muscle relaxant and your daily anti-inflammatory as discussed. Apply cool compresses to reduce symptoms. Follow-up with your provider for ongoing symptoms.

## 2015-10-25 NOTE — ED Notes (Signed)
Pt reports pain going up back of neck into base of skull on right and left side; pt states pain started on Thursday and she thought it was just stress related as she has a lot going on right now.  Pt states pain has not resolved, difficulty turning head to right and left, difficulty sleeping.  Pt in no immediate distress at this time.

## 2015-10-25 NOTE — ED Provider Notes (Signed)
[facility] Emergency Department Provider Note ____________________________________________  Time seen: 1404  I have reviewed the triage vital signs and the nursing notes.  HISTORY  Chief Complaint  Neck Pain  HPI Patricia [name] is a 54 y.o. female presents to the ED for evaluation of bilateral posterior neck pain with onset Thursday, 4 days prior to arrival. She denies any injury, trauma, or accident. She also denies any history of neck pain. She thought initially that she had slept in an unusual position, causing the pain. She states this is different from her typical stress related neck pain which generally affects the upper shoulders bilaterally. She describes the pain is worse with turning her head left or right and she has not found relief with over-the-counter anti-inflammatories. She denies any distal paresthesias, nausea, vomiting, or dizziness. She rates her discomfort at in triage is just achy pain at a 0/10.  Past Medical History  Diagnosis Date  . Obesity   . Hidradenitis suppurativa   . Hypertension   . Rosacea   . Degenerative disc disease   . Chronic sinusitis   . GERD (gastroesophageal reflux disease)   . Depression   . Anxiety   . Allergic rhinitis   . ADD (attention deficit disorder)   . Gastritis   . Laryngopharyngeal reflux (LPR)   . Rectal bleeding     Followed by GI     Patient Active Problem List   Diagnosis Date Noted  . Sinusitis, acute frontal 10/05/2015  . Hemorrhoid 06/30/2015  . Diarrhea 06/15/2015  . Rectal bleeding 06/15/2015  . Atherosclerosis of native arteries of the extremities with intermittent claudication 08/01/2012  . Numbness and tingling of right arm 08/01/2012  . Decreased pulses in feet 07/13/2012  . MRSA nasal colonization 06/15/2012  . Hidradenitis suppurativa of left axilla 04/03/2012  . Skin lesion 08/29/2011  . Laryngopharyngeal reflux (LPR) 08/29/2011  . Gynecologic exam normal 02/14/2011  . Other  screening mammogram 02/14/2011  . Routine general medical examination at a health care facility 02/08/2011  . ATTENTION DEFICIT DISORDER 09/01/2009  . OBESITY 10/28/2008  . EXCESSIVE OR FREQUENT MENSTRUATION 06/18/2008  . IRREGULAR MENSTRUAL CYCLE 05/14/2008  . TRANSAMINASES, SERUM, ELEVATED 02/24/2008  . ESSENTIAL HYPERTENSION 10/09/2007  . ANXIETY 02/21/2007  . Depression 02/21/2007  . ALLERGIC RHINITIS 02/21/2007  . GERD w/ Laryngopharyngeal reflux 02/21/2007  . ROSACEA 02/21/2007  . DEGENERATIVE DISC DISEASE 02/21/2007    Past Surgical History  Procedure Laterality Date  . Cesarean section  Feb. 1995  . Tonsillectomy    . Anterior cruciate ligament repair    . Pilonidal cyst drainage    . Upper gastrointestinal endoscopy  01/26/2009    Gastritis, GERD  . Spinal fusion  03/30/2009    L4 L5 S1 Lumbar  . Spinal fusion  Feb. 1997    Lumbar fusion  . Joint replacement  Sept. 1993    Right knee ACL replacement  . Colonoscopy  01/18/2012  . Hysteroscopy  february 2014    Current Outpatient Rx  Name  Route  Sig  Dispense  Refill  . amoxicillin-clavulanate (AUGMENTIN) 875-125 MG tablet   Oral   Take 1 tablet by mouth 2 (two) times daily.   14 tablet   0   . citalopram (CELEXA) 10 MG tablet   Oral   Take 1 tablet (10 mg total) by mouth daily. For 7 days, then decrease to 5 mg a day for 7 days, then discontinue   20 tablet   0   .  cyclobenzaprine (FLEXERIL) 5 MG tablet   Oral   Take 1 tablet (5 mg total) by mouth 3 (three) times daily as needed for muscle spasms.   15 tablet   0   . fluticasone (VERAMYST) 27.5 MCG/SPRAY nasal spray   Nasal   Place 2 sprays into the nose daily.   10 g   12    Allergies Amphetamine-dextroamphet er; Bupropion hcl; Celecoxib; Fluticasone propionate; Septra; and Sertraline hcl  Family History  Problem Relation Age of Onset  . Aneurysm Mother     2 abdominal aneurysms  . Hyperlipidemia Mother   . Hypertension Mother   . Heart  disease Mother   . Stroke Mother     small stroke, x's  aneurysm   . Esophageal cancer Father [id number]  . Cancer Father     esophageal CA,  tobacco user  . Mental retardation Father   . Hypertension Sister   . Obesity Sister   . Hypertension Brother   . Hyperlipidemia Brother   . ADD / ADHD Son   . Breast cancer Maternal Aunt   . Colon cancer Maternal Grandmother   . Colon cancer Maternal Grandmother   . Colon cancer Maternal Uncle    Social History Social History  Substance Use Topics  . Smoking status: Never Smoker   . Smokeless tobacco: Never Used  . Alcohol Use: 2.4 oz/week    4 Glasses of wine per week     Comment: occ    Review of Systems  Constitutional: Negative for fever. Eyes: Negative for visual changes. ENT: Negative for sore throat. Cardiovascular: Negative for chest pain. Respiratory: Negative for shortness of breath. Gastrointestinal: Negative for abdominal pain, vomiting and diarrhea. Genitourinary: Negative for dysuria. Musculoskeletal: Negative for back pain. Neck pain as above. Skin: Negative for rash. Neurological: Negative for headaches, focal weakness or numbness. ____________________________________________  PHYSICAL EXAM:  VITAL SIGNS: ED Triage Vitals  Enc Vitals Group     BP 10/25/15 1220 132/72 mmHg     Pulse Rate 10/25/15 1220 89     Resp 10/25/15 1220 16     Temp 10/25/15 1220 98.8 F (37.1 C)     Temp Source 10/25/15 1220 Oral     SpO2 10/25/15 1220 98 %     Weight 10/25/15 1220 220 lb (99.791 kg)     Height 10/25/15 1220 5\' 5"  (1.651 m)     Head Cir --      Peak Flow --      Pain Score 10/25/15 1220 0     Pain Loc --      Pain Edu? --      Excl. in GC? --    Constitutional: Alert and oriented. Well appearing and in no distress. Head: Normocephalic and atraumatic.      Eyes: Conjunctivae are normal. PERRL. Normal extraocular movements      Ears: Canals clear. TMs intact bilaterally.   Nose: No congestion/rhinorrhea.    Mouth/Throat: Mucous membranes are moist.   Neck: Supple. No thyromegaly. Hematological/Lymphatic/Immunological: No cervical lymphadenopathy. Cardiovascular: Normal rate, regular rhythm.  Respiratory: Normal respiratory effort. No wheezes/rales/rhonchi. Gastrointestinal: Soft and nontender. No distention. Musculoskeletal: Normal spinal alignment without midline tenderness, spasm, deformity, step-off. Patient with supple cervical spine with normal range of motion although limited by tightness with right and left for patient. She is tender to palpation along the trapeziums muscles origins, bilaterally. Nontender with normal range of motion in all extremities.  Neurologic: Cranial nerves II through XII grossly intact. Normal UE  DTRs bilaterally. Normal gait without ataxia. Normal speech and language. No gross focal neurologic deficits are appreciated. Skin:  Skin is warm, dry and intact. No rash noted. Psychiatric: Mood and affect are normal. Patient exhibits appropriate insight and judgment. ____________________________________________  INITIAL IMPRESSION / ASSESSMENT AND PLAN / ED COURSE  Patient with normal exam consistent with bilateral muscle tension. She'll be discharged with prescription for cyclobenzaprine to dose as directed. She is encouraged to apply ice compresses to reduce symptoms. She'll follow with a primary care provider for ongoing symptoms. ____________________________________________  FINAL CLINICAL IMPRESSION(S) / ED DIAGNOSES  Final diagnoses:  Neck muscle strain, initial encounter  Torticollis, acute      Lissa Hoard, PA-C 10/26/15 1914  Emily Filbert, MD 11/04/15 [phone]

## 2015-10-25 NOTE — ED Notes (Signed)
Pt to ED via POV. Pt ambulatory. Pt states that she noticed bilateral neck pain that runs up into bottom of head on Thursday. PT thought that she "slept wrong". Pain continues. Pt alert and oriented X4, active, cooperative, pt in NAD. RR even and unlabored, color WNL.

## 2015-11-05 ENCOUNTER — Encounter: Payer: Self-pay | Admitting: Surgical

## 2015-11-17 ENCOUNTER — Encounter: Payer: Self-pay | Admitting: Family Medicine

## 2015-11-17 ENCOUNTER — Ambulatory Visit (INDEPENDENT_AMBULATORY_CARE_PROVIDER_SITE_OTHER): Payer: No Typology Code available for payment source | Admitting: Family Medicine

## 2015-11-17 VITALS — BP 132/88 | HR 98 | Temp 99.1°F | Ht 65.0 in | Wt 235.6 lb

## 2015-11-17 DIAGNOSIS — J309 Allergic rhinitis, unspecified: Secondary | ICD-10-CM

## 2015-11-17 MED ORDER — AMOXICILLIN-POT CLAVULANATE 875-125 MG PO TABS: 1.0000 | ORAL_TABLET | Freq: Two times a day (BID) | ORAL | Status: DC

## 2015-11-17 MED ORDER — PREDNISONE 20 MG PO TABS: 40.0000 mg | ORAL_TABLET | Freq: Every day | ORAL | Status: DC

## 2015-11-17 NOTE — Assessment & Plan Note (Signed)
Suspect symptoms are most likely related to allergies given lack of fever and short duration of sinus pressure and congestion. Doubt bacterial infection at this time. Could be viral infection. We will treat her with a short course of prednisone for the allergic component. I did provide a prescription for Augmentin if she does not improve in the next 2-3 days. She was advised to only take this if her symptoms remain the same. If she develops new symptoms she is to call us prior to starting the antibiotic. She's given return precautions.

## 2015-11-17 NOTE — Patient Instructions (Signed)
Nice to see you. Your symptoms are likely related to allergies. We will treat you with a short course of prednisone. You should continue the Zyrtec. I'm doing a paper prescription for antibiotic to take if your symptoms do not improve in the next 2-3 days. If you develop new symptoms or fever you need to let us know prior starting the antibiotic. If you develop chest pain, shortness of breath, persistent fever, cough productive of blood, or any new or change in symptoms please seek medical attention.

## 2015-11-17 NOTE — Progress Notes (Signed)
Patient ID: Patricia [name], female   DOB: 1961/05/22, 55 y.o.   MRN: [id number]  Marikay Alar, MD Phone: [phone]  Patricia [name] is a 55 y.o. female who presents today for same-day visit.  Allergic rhinitis: Patient notes about a week of sinus congestion, rhinorrhea, nasal congestion, and minimal cough. She notes she was treated with antibiotics about a month and a half ago and improved from this, the persistently had postnasal drip. She started taking Zyrtec on Saturday and that opened her up, though yesterday had increased sinus pressure. She is unable to blow anything out of her nose. Cough is nonproductive. No fevers or shortness of breath. She additionally use and heavy pot. Patient does have a history of allergic rhinitis. Also has history of sinus infections in the past.  PMH: nonsmoker.   ROS see history of present illness  Objective  Physical Exam Filed Vitals:   11/17/15 0757  BP: 132/88  Pulse: 98  Temp: 99.1 F (37.3 C)    Physical Exam  Constitutional: She is well-developed, well-nourished, and in no distress.  HENT:  Head: Normocephalic and atraumatic.  Right Ear: External ear normal.  Left Ear: External ear normal.  Normal TMs bilaterally, mild posterior oropharyngeal erythema with no exudate or tonsillar swelling, no tenderness to percussion of frontal or maxillary sinuses  Eyes: Conjunctivae are normal. Pupils are equal, round, and reactive to light.  Neck: Neck supple.  Cardiovascular: Normal rate, regular rhythm and normal heart sounds.  Exam reveals no gallop and no friction rub.   No murmur heard. Pulmonary/Chest: Effort normal and breath sounds normal. No respiratory distress. She has no wheezes. She has no rales.  Lymphadenopathy:    She has no cervical adenopathy.  Neurological: She is alert. Gait normal.  Skin: Skin is warm and dry. She is not diaphoretic.     Assessment/Plan: Please see individual problem list.  Allergic rhinitis Suspect  symptoms are most likely related to allergies given lack of fever and short duration of sinus pressure and congestion. Doubt bacterial infection at this time. Could be viral infection. We will treat her with a short course of prednisone for the allergic component. I did provide a prescription for Augmentin if she does not improve in the next 2-3 days. She was advised to only take this if her symptoms remain the same. If she develops new symptoms she is to call us prior to starting the antibiotic. She's given return precautions.    Meds ordered this encounter  Medications  . predniSONE (DELTASONE) 20 MG tablet    Sig: Take 2 tablets (40 mg total) by mouth daily with breakfast.    Dispense:  10 tablet    Refill:  0  . amoxicillin-clavulanate (AUGMENTIN) 875-125 MG tablet    Sig: Take 1 tablet by mouth 2 (two) times daily.    Dispense:  14 tablet    Refill:  0     Marikay Alar

## 2015-11-17 NOTE — Progress Notes (Signed)
Pre visit review using our clinic review tool, if applicable. No additional management support is needed unless otherwise documented below in the visit note. 

## 2015-12-14 ENCOUNTER — Encounter: Payer: Self-pay | Admitting: Family Medicine

## 2015-12-16 ENCOUNTER — Other Ambulatory Visit: Payer: Self-pay | Admitting: Family Medicine

## 2015-12-16 MED ORDER — BUPROPION HCL ER (XL) 150 MG PO TB24
150.0000 mg | ORAL_TABLET | Freq: Every day | ORAL | Status: DC
Start: 2015-12-16 — End: 2016-01-02

## 2015-12-29 ENCOUNTER — Encounter: Payer: Self-pay | Admitting: Family Medicine

## 2015-12-30 ENCOUNTER — Encounter: Payer: Self-pay | Admitting: Family Medicine

## 2015-12-31 ENCOUNTER — Ambulatory Visit (INDEPENDENT_AMBULATORY_CARE_PROVIDER_SITE_OTHER): Payer: No Typology Code available for payment source | Admitting: Nurse Practitioner

## 2015-12-31 ENCOUNTER — Encounter: Payer: Self-pay | Admitting: Nurse Practitioner

## 2015-12-31 VITALS — BP 126/98 | HR 85 | Temp 98.3°F | Ht 65.0 in | Wt 236.5 lb

## 2015-12-31 DIAGNOSIS — F329 Major depressive disorder, single episode, unspecified: Secondary | ICD-10-CM | POA: Diagnosis not present

## 2015-12-31 DIAGNOSIS — F32A Depression, unspecified: Secondary | ICD-10-CM

## 2015-12-31 NOTE — Patient Instructions (Addendum)
Stop the Wellbutrin and see if this helps relax your neck muscles.

## 2015-12-31 NOTE — Progress Notes (Signed)
Patient ID: Patricia [name], female    DOB: Apr 26, 1961  Age: 55 y.o. MRN: [id number]  CC: Acute Visit   HPI Patricia [name] presents for follow up of medication concerns and neck pain.   1) Met with Dr. Caryl Bis in Nov 2016, opted for therapy vs. meds Later started Celexa, did not feel like it helped and made her anergic  Zoloft- tried in past Wellbutrin- agitation- somewhat over the weekend  At work felt "flat out angry"   Pain in neck with turning head over the last 5 days Center of neck to posterior head  Building up over the last days she reports  Worse with waking up and going from lying to sitting  Has not moved any furniture recently or had trauma  Stress has affected her neck before and she feels tension is something that affects this Pt had similar concerns with Vyvanse and upon stopping there was resolution  10/10 and "painful"  Today isolated to left side  Having trouble turning to look in traffic  Pt has had low back pain that was DDD related and 2 previous back surgeries. She does not feel this is similar in nature.   Treatment to date:  Advil and hot rice bag  3 advil- very helpful over a few hours, but returns    Denies numbness, radiating pain, stabbing/burning/dull sensations.      History Patricia has a past medical history of Obesity; Hidradenitis suppurativa; Hypertension; Rosacea; Degenerative disc disease; Chronic sinusitis; GERD (gastroesophageal reflux disease); Depression; Anxiety; Allergic rhinitis; ADD (attention deficit disorder); Gastritis; Laryngopharyngeal reflux (LPR); and Rectal bleeding.   She has past surgical history that includes Cesarean section (Feb. 1995); Tonsillectomy; Anterior cruciate ligament repair; Pilonidal cyst drainage; Upper gastrointestinal endoscopy (01/26/2009); Spinal fusion (03/30/2009); Spinal fusion (Feb. 1997); Joint replacement (Sept. 1993); Colonoscopy (01/18/2012); and Hysteroscopy (february 2014).   Her family history  includes ADD / ADHD in her son; Aneurysm in her mother; Breast cancer in her maternal aunt; Cancer in her father; Colon cancer in her maternal grandmother, maternal grandmother, and maternal uncle; Esophageal cancer (age of onset: 19) in her father; Heart disease in her mother; Hyperlipidemia in her brother and mother; Hypertension in her brother, mother, and sister; Mental retardation in her father; Obesity in her sister; Stroke in her mother.She reports that she has never smoked. She has never used smokeless tobacco. She reports that she drinks about 2.4 oz of alcohol per week. She reports that she does not use illicit drugs.  Outpatient Prescriptions Prior to Visit  Medication Sig Dispense Refill  . cyclobenzaprine (FLEXERIL) 5 MG tablet Take 1 tablet (5 mg total) by mouth 3 (three) times daily as needed for muscle spasms. 15 tablet 0  . fluticasone (VERAMYST) 27.5 MCG/SPRAY nasal spray Place 2 sprays into the nose daily. 10 g 12  . buPROPion (WELLBUTRIN XL) 150 MG 24 hr tablet Take 1 tablet (150 mg total) by mouth daily. (Patient not taking: Reported on 12/31/2015) 30 tablet 2  . amoxicillin-clavulanate (AUGMENTIN) 875-125 MG tablet Take 1 tablet by mouth 2 (two) times daily. (Patient not taking: Reported on 12/31/2015) 14 tablet 0  . predniSONE (DELTASONE) 20 MG tablet Take 2 tablets (40 mg total) by mouth daily with breakfast. (Patient not taking: Reported on 12/31/2015) 10 tablet 0   No facility-administered medications prior to visit.    ROS Review of Systems  Constitutional: Negative for fever, chills, diaphoresis, activity change, appetite change, fatigue and unexpected weight change.  Eyes: Negative for  visual disturbance.  Respiratory: Negative for chest tightness and shortness of breath.   Cardiovascular: Negative for chest pain.  Gastrointestinal: Negative for nausea, vomiting and diarrhea.  Musculoskeletal: Positive for myalgias and neck pain.  Neurological: Negative for headaches.   Psychiatric/Behavioral: Positive for agitation. Negative for suicidal ideas and sleep disturbance. The patient is not nervous/anxious.     Objective:  BP 126/98 mmHg  Pulse 85  Temp(Src) 98.3 F (36.8 C) (Oral)  Ht _0  (1.651 m)  Wt 236 lb 8 oz (107.276 kg)  BMI 39.36 kg/m2  SpO2 97%  Physical Exam  Constitutional: She is oriented to person, place, and time. She appears well-developed and well-nourished. No distress.  HENT:  Head: Normocephalic and atraumatic.  Right Ear: External ear normal.  Left Ear: External ear normal.  Eyes: EOM are normal. Pupils are equal, round, and reactive to light. Right eye exhibits no discharge. Left eye exhibits no discharge. No scleral icterus.  Neck: Normal range of motion. No JVD present. No tracheal deviation present. No thyromegaly present.  Trapezius muscles tight and tender bilaterally, full ROM of head/neck, but is slow due to tenderness   Cardiovascular: Normal rate, regular rhythm and normal heart sounds.  Exam reveals no gallop and no friction rub.   No murmur heard. Pulmonary/Chest: Effort normal and breath sounds normal. No stridor. No respiratory distress. She has no wheezes. She has no rales. She exhibits no tenderness.  Lymphadenopathy:    She has no cervical adenopathy.  Neurological: She is alert and oriented to person, place, and time. No cranial nerve deficit. She exhibits normal muscle tone. Coordination normal.  Deltoid 5/5 Bilateral, Biceps 5/5 bilateral, Wrist extensors 5/5 bilateral, Triceps 5/5 bilateral, finger flexors and abductors 5/5 bilateral, grip 5/5 bilateraly  Sensation intact upper and lower extremities.     Skin: Skin is warm and dry. No rash noted. She is not diaphoretic.  Psychiatric: She has a normal mood and affect. Her behavior is normal. Judgment and thought content normal.   Assessment & Plan:   There are no diagnoses linked to this encounter. I have discontinued Ms. [name]'s predniSONE and  amoxicillin-clavulanate. I am also having her maintain her fluticasone, cyclobenzaprine, and buPROPion.  No orders of the defined types were placed in this encounter.     Follow-up: Return if symptoms worsen or fail to improve, for Follow up with PCP .

## 2015-12-31 NOTE — Progress Notes (Signed)
Pre visit review using our clinic review tool, if applicable. No additional management support is needed unless otherwise documented below in the visit note. 

## 2016-01-02 NOTE — Assessment & Plan Note (Signed)
Established problem; worsening Pt reports neck pain from tension and increased agitation, again no SI or HI. Pt has been on Wellbutrin XL 150 mg x 14 days and skipped today's dosage. She feels no affects and would like to stop this medication. After much discussion of symptoms and ways to treat patient and I have reached a conclusion of stopping the medication, close FU w/ PCP who will be made aware of this decision. She will continue OTC measures for neck pain and RTC if worsening or failure to improve after medication cessation.

## 2016-01-03 ENCOUNTER — Encounter: Payer: Self-pay | Admitting: Nurse Practitioner

## 2016-01-03 ENCOUNTER — Telehealth: Payer: Self-pay

## 2016-01-03 ENCOUNTER — Ambulatory Visit (INDEPENDENT_AMBULATORY_CARE_PROVIDER_SITE_OTHER): Payer: No Typology Code available for payment source | Admitting: Nurse Practitioner

## 2016-01-03 VITALS — BP 142/86 | HR 90 | Temp 98.1°F | Resp 16 | Ht 65.0 in | Wt 239.2 lb

## 2016-01-03 DIAGNOSIS — M6248 Contracture of muscle, other site: Secondary | ICD-10-CM | POA: Diagnosis not present

## 2016-01-03 DIAGNOSIS — M62838 Other muscle spasm: Secondary | ICD-10-CM

## 2016-01-03 MED ORDER — CYCLOBENZAPRINE HCL 10 MG PO TABS: 10.0000 mg | ORAL_TABLET | Freq: Three times a day (TID) | ORAL | Status: DC | PRN

## 2016-01-03 MED ORDER — MELOXICAM 15 MG PO TABS: 15.0000 mg | ORAL_TABLET | Freq: Every day | ORAL | Status: DC

## 2016-01-03 NOTE — Telephone Encounter (Signed)
Reason for call: neck spasms, pt d/c wellbutrin Symptoms: necks spasms, pain level - 8 to 10 with motion, pain level  - 4 Duration: 12/31/15 Medications: advil 200mg  x 3pills once, flexaril 5mg  1 pill every 6hrs Last seen for this problem: 12/31/15 Seen by: Patricia Deanarrie Doss, NP   pt added to Patricia [name] schedule for today at 2:15pm. Pt insists that she can not go to work for the issue with her neck. Please advise, thanks

## 2016-01-03 NOTE — Patient Instructions (Addendum)
You can break either into half if you need to.

## 2016-01-03 NOTE — Progress Notes (Signed)
Patient ID: Patricia McgregorJanice F [name], female    DOB: 08-10-1961  Age: 55 y.o. MRN: [id number]  CC: Neck Pain   HPI Patricia [name] presents for follow up of neck spasms.   1) Flexeril took some from Dec.  Weaning off of celexa, had a death, brother was sick over Christmas Went to ED for torticollis  Advil not helpful over the weekend Friday night was in moderate discomfort  Saturday night was bad  Took one of the flexeril 5 mg   History Patricia NixonJanice has a past medical history of Obesity; Hidradenitis suppurativa; Hypertension; Rosacea; Degenerative disc disease; Chronic sinusitis; GERD (gastroesophageal reflux disease); Depression; Anxiety; Allergic rhinitis; ADD (attention deficit disorder); Gastritis; Laryngopharyngeal reflux (LPR); and Rectal bleeding.   She has past surgical history that includes Cesarean section (Feb. 1995); Tonsillectomy; Anterior cruciate ligament repair; Pilonidal cyst drainage; Upper gastrointestinal endoscopy (01/26/2009); Spinal fusion (03/30/2009); Spinal fusion (Feb. 1997); Joint replacement (Sept. 1993); Colonoscopy (01/18/2012); and Hysteroscopy (february 2014).   Her family history includes ADD / ADHD in her son; Aneurysm in her mother; Breast cancer in her maternal aunt; Cancer in her father; Colon cancer in her maternal grandmother, maternal grandmother, and maternal uncle; Esophageal cancer (age of onset: 7257) in her father; Heart disease in her mother; Hyperlipidemia in her brother and mother; Hypertension in her brother, mother, and sister; Mental retardation in her father; Obesity in her sister; Stroke in her mother.She reports that she has never smoked. She has never used smokeless tobacco. She reports that she drinks about 2.4 oz of alcohol per week. She reports that she does not use illicit drugs.  Outpatient Prescriptions Prior to Visit  Medication Sig Dispense Refill  . fluticasone (VERAMYST) 27.5 MCG/SPRAY nasal spray Place 2 sprays into the nose daily. 10 g 12  .  cyclobenzaprine (FLEXERIL) 5 MG tablet Take 1 tablet (5 mg total) by mouth 3 (three) times daily as needed for muscle spasms. 15 tablet 0   No facility-administered medications prior to visit.    ROS Review of Systems  Constitutional: Negative for fever, chills, diaphoresis and fatigue.  Respiratory: Negative for chest tightness, shortness of breath and wheezing.   Cardiovascular: Negative for chest pain, palpitations and leg swelling.  Gastrointestinal: Negative for nausea, vomiting and diarrhea.  Musculoskeletal: Positive for myalgias.  Skin: Negative for rash.  Neurological: Negative for dizziness, weakness, numbness and headaches.  Psychiatric/Behavioral: The patient is not nervous/anxious.     Objective:  BP 142/86 mmHg  Pulse 90  Temp(Src) 98.1 F (36.7 C) (Oral)  Resp 16  Ht 5\' 5"  (1.651 m)  Wt 239 lb 3.2 oz (108.5 kg)  BMI 39.80 kg/m2  SpO2 96%  Physical Exam  Constitutional: She is oriented to person, place, and time. She appears well-developed and well-nourished. No distress.  HENT:  Head: Normocephalic and atraumatic.  Right Ear: External ear normal.  Left Ear: External ear normal.  Neck: Normal range of motion. Neck supple.  Cardiovascular: Normal rate, regular rhythm and normal heart sounds.  Exam reveals no gallop and no friction rub.   No murmur heard. Pulmonary/Chest: Effort normal and breath sounds normal. No respiratory distress. She has no wheezes. She has no rales. She exhibits no tenderness.  Musculoskeletal: Normal range of motion. She exhibits tenderness. She exhibits no edema.  ROM improving  Tender trap muscles bilaterally   Lymphadenopathy:    She has no cervical adenopathy.  Neurological: She is alert and oriented to person, place, and time. No cranial nerve deficit. She exhibits  normal muscle tone. Coordination normal.  Skin: Skin is warm and dry. No rash noted. She is not diaphoretic.  Psychiatric: She has a normal mood and affect. Her  behavior is normal. Judgment and thought content normal.   Assessment & Plan:   Patricia [name] was seen today for neck pain.  Diagnoses and all orders for this visit:  Neck muscle spasm  Other orders -     meloxicam (MOBIC) 15 MG tablet; Take 1 tablet (15 mg total) by mouth daily. -     cyclobenzaprine (FLEXERIL) 10 MG tablet; Take 1 tablet (10 mg total) by mouth 3 (three) times daily as needed for muscle spasms.  I have discontinued Patricia [name]'s cyclobenzaprine. I am also having her start on meloxicam and cyclobenzaprine. Additionally, I am having her maintain her fluticasone.  Meds ordered this encounter  Medications  . meloxicam (MOBIC) 15 MG tablet    Sig: Take 1 tablet (15 mg total) by mouth daily.    Dispense:  30 tablet    Refill:  0    Order Specific Question:  Supervising Provider    Answer:  Duncan Dull L [[id number]]  . cyclobenzaprine (FLEXERIL) 10 MG tablet    Sig: Take 1 tablet (10 mg total) by mouth 3 (three) times daily as needed for muscle spasms.    Dispense:  30 tablet    Refill:  0    Order Specific Question:  Supervising Provider    Answer:  Sherlene Shams [[id number]]     Follow-up: Return if symptoms worsen or fail to improve.

## 2016-01-03 NOTE — Telephone Encounter (Signed)
Agree with repeat evaluation

## 2016-01-11 DIAGNOSIS — M62838 Other muscle spasm: Secondary | ICD-10-CM | POA: Insufficient documentation

## 2016-01-11 NOTE — Assessment & Plan Note (Signed)
Not related to Wellbutrin Est. Problem; worsening Willing to try Flexeril 10 mg and use half to whole pill at night Mobic for inflammation sent to pharmacy FU prn worsening/failure to improve.

## 2016-01-12 ENCOUNTER — Ambulatory Visit (INDEPENDENT_AMBULATORY_CARE_PROVIDER_SITE_OTHER): Payer: No Typology Code available for payment source | Admitting: Surgery

## 2016-01-12 ENCOUNTER — Encounter: Payer: Self-pay | Admitting: Surgery

## 2016-01-12 VITALS — BP 140/80 | HR 81 | Temp 98.1°F | Ht 65.0 in | Wt 241.2 lb

## 2016-01-12 DIAGNOSIS — L732 Hidradenitis suppurativa: Secondary | ICD-10-CM

## 2016-01-12 NOTE — Progress Notes (Signed)
Patient ID: Patricia McgregorJanice F [name], female   DOB: 20-Dec-1960, 55 y.o.   MRN: [id number]  History of Present Illness Patricia [name] is a 55 y.o. female with a strip of hidradenitis now comes for consultation of a acute exacerbation on the left axillary site. She reports intermittent sharp pain moderate in severity nonspecific out of eating or aggravating factors. She has been on doxycycline for close to 3 weeks without any major improvements. And she has not had any surgical excision. She does have occasional exacerbation on the right axilla and bilateral groins. Early dose sites are in active. He does not smoke she is obese no other risk factors for delayed healing.  Past Medical History Past Medical History  Diagnosis Date  . Obesity   . Hidradenitis suppurativa   . Hypertension   . Rosacea   . Degenerative disc disease   . Chronic sinusitis   . GERD (gastroesophageal reflux disease)   . Depression   . Anxiety   . Allergic rhinitis   . ADD (attention deficit disorder)   . Gastritis   . Laryngopharyngeal reflux (LPR)   . Rectal bleeding     Followed by GI      Past Surgical History  Procedure Laterality Date  . Cesarean section  Feb. 1995  . Tonsillectomy    . Anterior cruciate ligament repair    . Pilonidal cyst drainage    . Upper gastrointestinal endoscopy  01/26/2009    Gastritis, GERD  . Spinal fusion  03/30/2009    L4 L5 S1 Lumbar  . Spinal fusion  Feb. 1997    Lumbar fusion  . Joint replacement  Sept. 1993    Right knee ACL replacement  . Colonoscopy  01/18/2012  . Hysteroscopy  february 2014    Allergies  Allergen Reactions  . Amphetamine-Dextroamphet Er     REACTION: not effective  . Bupropion Hcl     REACTION: irritability  . Celecoxib     REACTION: edema  . Fluticasone Propionate     REACTION: not effective  . Septra [Sulfamethoxazole-Trimethoprim] Swelling  . Sertraline Hcl     REACTION: wt gain    Current Outpatient Prescriptions  Medication Sig Dispense Refill   . cyclobenzaprine (FLEXERIL) 10 MG tablet Take 1 tablet (10 mg total) by mouth 3 (three) times daily as needed for muscle spasms. 30 tablet 0  . fluticasone (VERAMYST) 27.5 MCG/SPRAY nasal spray Place 2 sprays into the nose daily. 10 g 12  . meloxicam (MOBIC) 15 MG tablet Take 1 tablet (15 mg total) by mouth daily. 30 tablet 0   No current facility-administered medications for this visit.    Family History Family History  Problem Relation Age of Onset  . Aneurysm Mother     2 abdominal aneurysms  . Hyperlipidemia Mother   . Hypertension Mother   . Heart disease Mother   . Stroke Mother     small stroke, x's  aneurysm   . Esophageal cancer Father [id number]  . Cancer Father     esophageal CA,  tobacco user  . Mental retardation Father   . Hypertension Sister   . Obesity Sister   . Hypertension Brother   . Hyperlipidemia Brother   . ADD / ADHD Son   . Breast cancer Maternal Aunt   . Colon cancer Maternal Grandmother   . Colon cancer Maternal Grandmother   . Colon cancer Maternal Uncle       Social History Social History  Substance Use Topics  .  Smoking status: Never Smoker   . Smokeless tobacco: Never Used  . Alcohol Use: 2.4 oz/week    4 Glasses of wine per week     Comment: occ       ROS Upon review of system was performance otherwise negative other than what is stated in the history of present illness  Physical Exam Blood pressure 140/80, pulse 81, temperature 98.1 F (36.7 C), temperature source Oral, height 5\' 5"  (1.651 m), weight 109.408 kg (241 lb 3.2 oz).  CONSTITUTIONAL: Obese but very pleasant lady in NAD EYES: Pupils equal, round, and reactive to light, Sclera non-icteric. EARS, NOSE, MOUTH AND THROAT: The oropharynx is clear. Oral mucosa is pink and moist. Hearing is intact to voice.  NECK: Trachea is midline, and there is no jugular venous distension. Thyroid is without palpable abnormalities. RESPIRATORY:  Lungs are clear, and breath sounds are equal  bilaterally. Normal respiratory effort without pathologic use of accessory muscles. CARDIOVASCULAR: Heart is regular without murmurs, gallops, or rubs. GI: The abdomen is  soft, nontender, and nondistended. There were no palpable masses. There was no hepatosplenomegaly. There were normal bowel sounds. MUSCULOSKELETAL:  Normal muscle strength and tone in all four extremities.    SKIN: leFt axilla with hidradenitis with multiple pits and some inflammatory process and erythema no evidence of an obvious cyst but there is several areas that are tender in palpation and there is induration NEUROLOGIC:  Motor and sensation is grossly normal.  Cranial nerves are grossly intact. PSYCH:  Alert and oriented to person, place and time. Affect is normal.  Data Reviewed I have personally reviewed the patient's imaging and medical records.    Assessment/Plan   Hidradenitis not responsive to antibiotic rx. Discussed with the patient in detail about the disease process of hidradenitis and how complex this in fact is. Discussed with her that excision of the hidradenitis sinuses with debridement and allow the wound to close by secondary intention usually is the treatment of choice. Cut with her about the procedure in detail risk, benefits and possible complications including but not limited to: Leading, infection, recurrence, persistent pain and delayed wound healing. She understands and she wishes to proceed.  Sterling Bigiego Phinneas Shakoor, MD FACS 01/12/2016, 3:54 PM

## 2016-01-12 NOTE — Patient Instructions (Signed)
You have been seen for your Hydradenitis. We will plan on excising this on 02/04/16. Please see your (blue) pre-care sheet that you have been given.

## 2016-01-13 ENCOUNTER — Telehealth: Payer: Self-pay | Admitting: Surgery

## 2016-01-13 NOTE — Telephone Encounter (Signed)
Pt advised of pre op date/time and sx date. Sx: 02/04/16 with Dr Alcario DroughtPabon--Left excision hidradenitis of axilla. Pre op: 01/28/16 between 9-1--phone.   Patient made aware to call [phone], between 1-3:00pm the day before surgery, to find out what time to arrive.

## 2016-01-25 ENCOUNTER — Telehealth: Payer: Self-pay | Admitting: Surgery

## 2016-01-25 NOTE — Telephone Encounter (Signed)
Authorization has been obtained for CPT: 11451. Auth# [id number]. Effective date: 02/04/16.

## 2016-01-28 ENCOUNTER — Other Ambulatory Visit: Payer: No Typology Code available for payment source

## 2016-01-28 ENCOUNTER — Encounter: Payer: Self-pay | Admitting: *Deleted

## 2016-01-28 NOTE — Patient Instructions (Signed)
  Your procedure is scheduled on: 02-04-16 (FRIDAY) Report to [facility] 2ND FLOOR To find out your arrival time please call [phone] between 1PM - 3PM on 02-03-16 (THURSDAY)  Remember: Instructions that are not followed completely may result in serious medical risk, up to and including death, or upon the discretion of your surgeon and anesthesiologist your surgery may need to be rescheduled.    _X___ 1. Do not eat food or drink liquids after midnight. No gum chewing or hard candies.     _X___ 2. No Alcohol for 24 hours before or after surgery.   ____ 3. Bring all medications with you on the day of surgery if instructed.    _X___ 4. Notify your doctor if there is any change in your medical condition     (cold, fever, infections).     Do not wear jewelry, make-up, hairpins, clips or nail polish.  Do not wear lotions, powders, or perfumes. You may wear deodorant.  Do not shave 48 hours prior to surgery. Men may shave face and neck.  Do not bring valuables to the hospital.    [facility] any belongings or valuables.               Contacts, dentures or bridgework may not be worn into surgery.  Leave your suitcase in the car. After surgery it may be brought to your room.  For patients admitted to the hospital, discharge time is determined by your  treatment team.   Patients discharged the day of surgery will not be allowed to drive home.   Please read over the following fact sheets that you were given:     _X___ Take these medicines the morning of surgery with A SIP OF WATER:    1. OMEPRAZOLE  2. TAKE AN OMEPRAZOLE ON Thursday NIGHT (02-03-16 ) BEFORE BED  3.   4.  5.  6.  ____ Fleet Enema (as directed)   _X___ Use CHG Soap as directed  ____ Use inhalers on the day of surgery  ____ Stop metformin 2 days prior to surgery    ____ Take 1/2 of usual insulin dose the night before surgery and none on the morning of surgery.   ____ Stop  Coumadin/Plavix/aspirin-N/A  _X___ Stop Anti-inflammatories-STOP MELOXICAM NOW-NO NSAIDS OR ASPIRIN PRODUCTS-TYLENOL OK TO TAKE   ____ Stop supplements until after surgery.    ____ Bring C-Pap to the hospital.

## 2016-02-02 ENCOUNTER — Encounter
Admission: RE | Admit: 2016-02-02 | Discharge: 2016-02-02 | Disposition: A | Discharge status: Home / Self Care | Payer: PRIVATE HEALTH INSURANCE | Source: Ambulatory Visit | Attending: Surgery | Admitting: Surgery

## 2016-02-02 DIAGNOSIS — F419 Anxiety disorder, unspecified: Secondary | ICD-10-CM | POA: Diagnosis not present

## 2016-02-02 DIAGNOSIS — Z823 Family history of stroke: Secondary | ICD-10-CM | POA: Diagnosis not present

## 2016-02-02 DIAGNOSIS — F988 Other specified behavioral and emotional disorders with onset usually occurring in childhood and adolescence: Secondary | ICD-10-CM | POA: Diagnosis not present

## 2016-02-02 DIAGNOSIS — J309 Allergic rhinitis, unspecified: Secondary | ICD-10-CM | POA: Diagnosis not present

## 2016-02-02 DIAGNOSIS — Z888 Allergy status to other drugs, medicaments and biological substances status: Secondary | ICD-10-CM | POA: Diagnosis not present

## 2016-02-02 DIAGNOSIS — E669 Obesity, unspecified: Secondary | ICD-10-CM | POA: Diagnosis not present

## 2016-02-02 DIAGNOSIS — F329 Major depressive disorder, single episode, unspecified: Secondary | ICD-10-CM | POA: Diagnosis not present

## 2016-02-02 DIAGNOSIS — Z79899 Other long term (current) drug therapy: Secondary | ICD-10-CM | POA: Diagnosis not present

## 2016-02-02 DIAGNOSIS — Z96651 Presence of right artificial knee joint: Secondary | ICD-10-CM | POA: Diagnosis not present

## 2016-02-02 DIAGNOSIS — L732 Hidradenitis suppurativa: Secondary | ICD-10-CM | POA: Diagnosis present

## 2016-02-02 DIAGNOSIS — I10 Essential (primary) hypertension: Secondary | ICD-10-CM | POA: Diagnosis not present

## 2016-02-02 DIAGNOSIS — Z8249 Family history of ischemic heart disease and other diseases of the circulatory system: Secondary | ICD-10-CM | POA: Diagnosis not present

## 2016-02-02 DIAGNOSIS — Z881 Allergy status to other antibiotic agents status: Secondary | ICD-10-CM | POA: Diagnosis not present

## 2016-02-02 DIAGNOSIS — Z803 Family history of malignant neoplasm of breast: Secondary | ICD-10-CM | POA: Diagnosis not present

## 2016-02-02 DIAGNOSIS — Z981 Arthrodesis status: Secondary | ICD-10-CM | POA: Diagnosis not present

## 2016-02-02 DIAGNOSIS — L719 Rosacea, unspecified: Secondary | ICD-10-CM | POA: Diagnosis not present

## 2016-02-02 DIAGNOSIS — K219 Gastro-esophageal reflux disease without esophagitis: Secondary | ICD-10-CM | POA: Diagnosis not present

## 2016-02-02 DIAGNOSIS — Z800 Family history of malignant neoplasm of digestive organs: Secondary | ICD-10-CM | POA: Diagnosis not present

## 2016-02-02 LAB — SURGICAL PCR SCREEN
MRSA, PCR: NEGATIVE
Staphylococcus aureus: NEGATIVE

## 2016-02-04 ENCOUNTER — Encounter: Payer: Self-pay | Admitting: *Deleted

## 2016-02-04 ENCOUNTER — Ambulatory Visit
Admission: RE | Admit: 2016-02-04 | Discharge: 2016-02-04 | Disposition: A | Discharge status: Home / Self Care | Payer: PRIVATE HEALTH INSURANCE | Source: Ambulatory Visit | Attending: Surgery | Admitting: Surgery

## 2016-02-04 ENCOUNTER — Ambulatory Visit: Payer: PRIVATE HEALTH INSURANCE | Admitting: Certified Registered"

## 2016-02-04 ENCOUNTER — Encounter
Admission: RE | Disposition: A | Discharge status: Home / Self Care | Payer: Self-pay | Source: Ambulatory Visit | Attending: Surgery

## 2016-02-04 DIAGNOSIS — Z888 Allergy status to other drugs, medicaments and biological substances status: Secondary | ICD-10-CM | POA: Insufficient documentation

## 2016-02-04 DIAGNOSIS — F329 Major depressive disorder, single episode, unspecified: Secondary | ICD-10-CM | POA: Insufficient documentation

## 2016-02-04 DIAGNOSIS — Z8249 Family history of ischemic heart disease and other diseases of the circulatory system: Secondary | ICD-10-CM | POA: Insufficient documentation

## 2016-02-04 DIAGNOSIS — Z881 Allergy status to other antibiotic agents status: Secondary | ICD-10-CM | POA: Insufficient documentation

## 2016-02-04 DIAGNOSIS — Z818 Family history of other mental and behavioral disorders: Secondary | ICD-10-CM | POA: Insufficient documentation

## 2016-02-04 DIAGNOSIS — L732 Hidradenitis suppurativa: Secondary | ICD-10-CM | POA: Insufficient documentation

## 2016-02-04 DIAGNOSIS — E669 Obesity, unspecified: Secondary | ICD-10-CM | POA: Insufficient documentation

## 2016-02-04 DIAGNOSIS — Z800 Family history of malignant neoplasm of digestive organs: Secondary | ICD-10-CM | POA: Insufficient documentation

## 2016-02-04 DIAGNOSIS — K219 Gastro-esophageal reflux disease without esophagitis: Secondary | ICD-10-CM | POA: Insufficient documentation

## 2016-02-04 DIAGNOSIS — I10 Essential (primary) hypertension: Secondary | ICD-10-CM | POA: Insufficient documentation

## 2016-02-04 DIAGNOSIS — F988 Other specified behavioral and emotional disorders with onset usually occurring in childhood and adolescence: Secondary | ICD-10-CM | POA: Insufficient documentation

## 2016-02-04 DIAGNOSIS — Z96651 Presence of right artificial knee joint: Secondary | ICD-10-CM | POA: Insufficient documentation

## 2016-02-04 DIAGNOSIS — I739 Peripheral vascular disease, unspecified: Secondary | ICD-10-CM | POA: Insufficient documentation

## 2016-02-04 DIAGNOSIS — Z981 Arthrodesis status: Secondary | ICD-10-CM | POA: Insufficient documentation

## 2016-02-04 DIAGNOSIS — Z803 Family history of malignant neoplasm of breast: Secondary | ICD-10-CM | POA: Insufficient documentation

## 2016-02-04 DIAGNOSIS — L719 Rosacea, unspecified: Secondary | ICD-10-CM | POA: Insufficient documentation

## 2016-02-04 DIAGNOSIS — J309 Allergic rhinitis, unspecified: Secondary | ICD-10-CM | POA: Insufficient documentation

## 2016-02-04 DIAGNOSIS — Z79899 Other long term (current) drug therapy: Secondary | ICD-10-CM | POA: Insufficient documentation

## 2016-02-04 DIAGNOSIS — F419 Anxiety disorder, unspecified: Secondary | ICD-10-CM | POA: Insufficient documentation

## 2016-02-04 DIAGNOSIS — Z823 Family history of stroke: Secondary | ICD-10-CM | POA: Insufficient documentation

## 2016-02-04 HISTORY — PX: HYDRADENITIS EXCISION: SHX5243

## 2016-02-04 HISTORY — DX: Other complications of anesthesia, initial encounter: T88.59XA

## 2016-02-04 HISTORY — DX: Nausea with vomiting, unspecified: R11.2

## 2016-02-04 HISTORY — DX: Adverse effect of unspecified anesthetic, initial encounter: T41.45XA

## 2016-02-04 HISTORY — DX: Personal history of Methicillin resistant Staphylococcus aureus infection: Z86.14

## 2016-02-04 HISTORY — DX: Other specified postprocedural states: Z98.890

## 2016-02-04 LAB — POCT PREGNANCY, URINE: Preg Test, Ur: NEGATIVE

## 2016-02-04 SURGERY — EXCISION, HIDRADENITIS, AXILLA
Anesthesia: General | Site: Axilla | Laterality: Left | Wound class: Clean Contaminated

## 2016-02-04 MED ORDER — ONDANSETRON HCL 4 MG/2ML IJ SOLN
INTRAMUSCULAR | Status: DC | PRN
  Administered 2016-02-04: 4 mg via INTRAVENOUS

## 2016-02-04 MED ORDER — BUPIVACAINE-EPINEPHRINE (PF) 0.25% -1:200000 IJ SOLN
INTRAMUSCULAR | Status: AC
  Filled 2016-02-04: qty 30

## 2016-02-04 MED ORDER — LACTATED RINGERS IV SOLN
INTRAVENOUS | Status: DC
  Administered 2016-02-04: 07:00:00 via INTRAVENOUS

## 2016-02-04 MED ORDER — MIDAZOLAM HCL 2 MG/2ML IJ SOLN
INTRAMUSCULAR | Status: DC | PRN
  Administered 2016-02-04: 2 mg via INTRAVENOUS

## 2016-02-04 MED ORDER — CEFAZOLIN SODIUM-DEXTROSE 2-4 GM/100ML-% IV SOLN
INTRAVENOUS | Status: AC
  Filled 2016-02-04: qty 100

## 2016-02-04 MED ORDER — FENTANYL CITRATE (PF) 100 MCG/2ML IJ SOLN: 25.0000 ug | INTRAMUSCULAR | Status: DC | PRN

## 2016-02-04 MED ORDER — CHLORHEXIDINE GLUCONATE 4 % EX LIQD: 1.0000 "application " | Freq: Once | CUTANEOUS | Status: DC

## 2016-02-04 MED ORDER — ROCURONIUM BROMIDE 100 MG/10ML IV SOLN
INTRAVENOUS | Status: DC | PRN
  Administered 2016-02-04: 5 mg via INTRAVENOUS

## 2016-02-04 MED ORDER — CEFAZOLIN SODIUM 1 G IJ SOLR
2.0000 g | INTRAMUSCULAR | Status: DC
  Filled 2016-02-04: qty 20

## 2016-02-04 MED ORDER — LIDOCAINE HCL (CARDIAC) 20 MG/ML IV SOLN
INTRAVENOUS | Status: DC | PRN
  Administered 2016-02-04: 100 mg via INTRAVENOUS

## 2016-02-04 MED ORDER — ONDANSETRON HCL 4 MG/2ML IJ SOLN: 4.0000 mg | Freq: Once | INTRAMUSCULAR | Status: DC | PRN

## 2016-02-04 MED ORDER — OXYCODONE-ACETAMINOPHEN 7.5-325 MG PO TABS: 1.0000 | ORAL_TABLET | ORAL | Status: DC | PRN

## 2016-02-04 MED ORDER — FENTANYL CITRATE (PF) 100 MCG/2ML IJ SOLN
INTRAMUSCULAR | Status: DC | PRN
  Administered 2016-02-04: 100 ug via INTRAVENOUS
  Administered 2016-02-04: 50 ug via INTRAVENOUS

## 2016-02-04 MED ORDER — SUCCINYLCHOLINE CHLORIDE 20 MG/ML IJ SOLN
INTRAMUSCULAR | Status: DC | PRN
  Administered 2016-02-04: 120 mg via INTRAVENOUS

## 2016-02-04 MED ORDER — PROPOFOL 10 MG/ML IV BOLUS
INTRAVENOUS | Status: DC | PRN
  Administered 2016-02-04: 40 mg via INTRAVENOUS
  Administered 2016-02-04: 160 mg via INTRAVENOUS

## 2016-02-04 MED ORDER — BUPIVACAINE-EPINEPHRINE (PF) 0.25% -1:200000 IJ SOLN
INTRAMUSCULAR | Status: DC | PRN
  Administered 2016-02-04: 30 mL via PERINEURAL

## 2016-02-04 MED ORDER — KETOROLAC TROMETHAMINE 30 MG/ML IJ SOLN
INTRAMUSCULAR | Status: DC | PRN
  Administered 2016-02-04: 30 mg via INTRAVENOUS

## 2016-02-04 SURGICAL SUPPLY — 21 items
BLADE SURG 15 STRL LF DISP TIS (BLADE) ×2 IMPLANT
BLADE SURG 15 STRL SS (BLADE) ×2
CANISTER SUCT 1200ML W/VALVE (MISCELLANEOUS) ×2 IMPLANT
CHLORAPREP W/TINT 26ML (MISCELLANEOUS) ×2 IMPLANT
DRAPE LAPAROTOMY TRNSV 106X77 (MISCELLANEOUS) ×2 IMPLANT
GAUZE PACKING 1/4X5YD (GAUZE/BANDAGES/DRESSINGS) ×2 IMPLANT
GAUZE SPONGE 4X4 12PLY STRL (GAUZE/BANDAGES/DRESSINGS) ×2 IMPLANT
GLOVE BIO SURGEON STRL SZ7 (GLOVE) ×2 IMPLANT
GOWN STRL REUS W/ TWL LRG LVL3 (GOWN DISPOSABLE) ×2 IMPLANT
GOWN STRL REUS W/TWL LRG LVL3 (GOWN DISPOSABLE) ×2
LABEL OR SOLS (LABEL) ×2 IMPLANT
LIQUID BAND (GAUZE/BANDAGES/DRESSINGS) ×2 IMPLANT
NDL SAFETY 25GX1.5 (NEEDLE) ×2 IMPLANT
NEEDLE HYPO 22GX1.5 SAFETY (NEEDLE) ×2 IMPLANT
NS IRRIG 500ML POUR BTL (IV SOLUTION) ×2 IMPLANT
PACK BASIN MINOR ARMC (MISCELLANEOUS) ×2 IMPLANT
SUT MNCRL 4-0 (SUTURE) ×2
SUT MNCRL 4-0 27XMFL (SUTURE) ×2
SUT VIC AB 2-0 CT2 27 (SUTURE) ×4 IMPLANT
SUTURE MNCRL 4-0 27XMF (SUTURE) ×2 IMPLANT
SYRINGE 10CC LL (SYRINGE) ×4 IMPLANT

## 2016-02-04 NOTE — Interval H&P Note (Signed)
History and Physical Interval Note:  02/04/2016 7:11 AM  Patricia [name]  has presented today for surgery, with the diagnosis of Hidradenitis  The various methods of treatment have been discussed with the patient and family. After consideration of risks, benefits and other options for treatment, the patient has consented to  Procedure(s): EXCISION HIDRADENITIS AXILLA (Left) as a surgical intervention .  The patient's history has been reviewed, patient examined, no change in status, stable for surgery.  I have reviewed the patient's chart and labs.  Questions were answered to the patient's satisfaction.     Diego F Pabon

## 2016-02-04 NOTE — Discharge Instructions (Addendum)

## 2016-02-04 NOTE — Anesthesia Preprocedure Evaluation (Signed)
Anesthesia Evaluation  Patient identified by MRN, date of birth, ID band Patient awake    Reviewed: Allergy & Precautions, NPO status , Patient's Chart, lab work & pertinent test results  History of Anesthesia Complications (+) PONV  Airway Mallampati: II  TM Distance: <3 FB     Dental  (+) Chipped   Pulmonary neg pulmonary ROS,    Pulmonary exam normal breath sounds clear to auscultation       Cardiovascular hypertension, Pt. on medications + Peripheral Vascular Disease  Normal cardiovascular exam     Neuro/Psych Anxiety Depression    GI/Hepatic Neg liver ROS, GERD  Medicated and Controlled,  Endo/Other  negative endocrine ROS  Renal/GU negative Renal ROS  negative genitourinary   Musculoskeletal  (+) Arthritis , Osteoarthritis,  Neck muscle spasms   Abdominal Normal abdominal exam  (+)   Peds negative pediatric ROS (+)  Hematology negative hematology ROS (+)   Anesthesia Other Findings   Reproductive/Obstetrics                             Anesthesia Physical Anesthesia Plan  ASA: III  Anesthesia Plan: General   Post-op Pain Management:    Induction: Intravenous  Airway Management Planned: Oral ETT  Additional Equipment:   Intra-op Plan:   Post-operative Plan: Extubation in OR  Informed Consent: I have reviewed the patients History and Physical, chart, labs and discussed the procedure including the risks, benefits and alternatives for the proposed anesthesia with the patient or authorized representative who has indicated his/her understanding and acceptance.   Dental advisory given  Plan Discussed with: CRNA and Surgeon  Anesthesia Plan Comments:         Anesthesia Quick Evaluation

## 2016-02-04 NOTE — Transfer of Care (Signed)
Immediate Anesthesia Transfer of Care Note  Patient: Patricia McgregorJanice F [name]  Procedure(s) Performed: Procedure(s): EXCISION HIDRADENITIS AXILLA (Left)  Patient Location: PACU  Anesthesia Type:General  Level of Consciousness: awake, alert  and responds to stimulation  Airway & Oxygen Therapy: Patient Spontanous Breathing and Patient connected to face mask oxygen  Post-op Assessment: Report given to RN and Post -op Vital signs reviewed and stable  Post vital signs: Reviewed and stable  Last Vitals:  Filed Vitals:   02/04/16 0816 02/04/16 0817  BP:  139/77  Pulse:  100  Temp: 36.5 C 36.5 C  Resp:  16    Complications: No apparent anesthesia complications

## 2016-02-04 NOTE — H&P (View-Only) (Signed)
Patient ID: Patricia McgregorJanice F [name], female   DOB: 20-Dec-1960, 55 y.o.   MRN: [id number]  History of Present Illness Patricia [name] is a 55 y.o. female with a strip of hidradenitis now comes for consultation of a acute exacerbation on the left axillary site. She reports intermittent sharp pain moderate in severity nonspecific out of eating or aggravating factors. She has been on doxycycline for close to 3 weeks without any major improvements. And she has not had any surgical excision. She does have occasional exacerbation on the right axilla and bilateral groins. Early dose sites are in active. He does not smoke she is obese no other risk factors for delayed healing.  Past Medical History Past Medical History  Diagnosis Date  . Obesity   . Hidradenitis suppurativa   . Hypertension   . Rosacea   . Degenerative disc disease   . Chronic sinusitis   . GERD (gastroesophageal reflux disease)   . Depression   . Anxiety   . Allergic rhinitis   . ADD (attention deficit disorder)   . Gastritis   . Laryngopharyngeal reflux (LPR)   . Rectal bleeding     Followed by GI      Past Surgical History  Procedure Laterality Date  . Cesarean section  Feb. 1995  . Tonsillectomy    . Anterior cruciate ligament repair    . Pilonidal cyst drainage    . Upper gastrointestinal endoscopy  01/26/2009    Gastritis, GERD  . Spinal fusion  03/30/2009    L4 L5 S1 Lumbar  . Spinal fusion  Feb. 1997    Lumbar fusion  . Joint replacement  Sept. 1993    Right knee ACL replacement  . Colonoscopy  01/18/2012  . Hysteroscopy  february 2014    Allergies  Allergen Reactions  . Amphetamine-Dextroamphet Er     REACTION: not effective  . Bupropion Hcl     REACTION: irritability  . Celecoxib     REACTION: edema  . Fluticasone Propionate     REACTION: not effective  . Septra [Sulfamethoxazole-Trimethoprim] Swelling  . Sertraline Hcl     REACTION: wt gain    Current Outpatient Prescriptions  Medication Sig Dispense Refill   . cyclobenzaprine (FLEXERIL) 10 MG tablet Take 1 tablet (10 mg total) by mouth 3 (three) times daily as needed for muscle spasms. 30 tablet 0  . fluticasone (VERAMYST) 27.5 MCG/SPRAY nasal spray Place 2 sprays into the nose daily. 10 g 12  . meloxicam (MOBIC) 15 MG tablet Take 1 tablet (15 mg total) by mouth daily. 30 tablet 0   No current facility-administered medications for this visit.    Family History Family History  Problem Relation Age of Onset  . Aneurysm Mother     2 abdominal aneurysms  . Hyperlipidemia Mother   . Hypertension Mother   . Heart disease Mother   . Stroke Mother     small stroke, x's  aneurysm   . Esophageal cancer Father [phone]  . Cancer Father     esophageal CA,  tobacco user  . Mental retardation Father   . Hypertension Sister   . Obesity Sister   . Hypertension Brother   . Hyperlipidemia Brother   . ADD / ADHD Son   . Breast cancer Maternal Aunt   . Colon cancer Maternal Grandmother   . Colon cancer Maternal Grandmother   . Colon cancer Maternal Uncle       Social History Social History  Substance Use Topics  .  Smoking status: Never Smoker   . Smokeless tobacco: Never Used  . Alcohol Use: 2.4 oz/week    4 Glasses of wine per week     Comment: occ       ROS Upon review of system was performance otherwise negative other than what is stated in the history of present illness  Physical Exam Blood pressure 140/80, pulse 81, temperature 98.1 F (36.7 C), temperature source Oral, height 5\' 5"  (1.651 m), weight 109.408 kg (241 lb 3.2 oz).  CONSTITUTIONAL: Obese but very pleasant lady in NAD EYES: Pupils equal, round, and reactive to light, Sclera non-icteric. EARS, NOSE, MOUTH AND THROAT: The oropharynx is clear. Oral mucosa is pink and moist. Hearing is intact to voice.  NECK: Trachea is midline, and there is no jugular venous distension. Thyroid is without palpable abnormalities. RESPIRATORY:  Lungs are clear, and breath sounds are equal  bilaterally. Normal respiratory effort without pathologic use of accessory muscles. CARDIOVASCULAR: Heart is regular without murmurs, gallops, or rubs. GI: The abdomen is  soft, nontender, and nondistended. There were no palpable masses. There was no hepatosplenomegaly. There were normal bowel sounds. MUSCULOSKELETAL:  Normal muscle strength and tone in all four extremities.    SKIN: leFt axilla with hidradenitis with multiple pits and some inflammatory process and erythema no evidence of an obvious cyst but there is several areas that are tender in palpation and there is induration NEUROLOGIC:  Motor and sensation is grossly normal.  Cranial nerves are grossly intact. PSYCH:  Alert and oriented to person, place and time. Affect is normal.  Data Reviewed I have personally reviewed the patient's imaging and medical records.    Assessment/Plan   Hidradenitis not responsive to antibiotic rx. Discussed with the patient in detail about the disease process of hidradenitis and how complex this in fact is. Discussed with her that excision of the hidradenitis sinuses with debridement and allow the wound to close by secondary intention usually is the treatment of choice. Cut with her about the procedure in detail risk, benefits and possible complications including but not limited to: Leading, infection, recurrence, persistent pain and delayed wound healing. She understands and she wishes to proceed.  Sterling Bigiego Anes Rigel, MD FACS 01/12/2016, 3:54 PM

## 2016-02-04 NOTE — Anesthesia Procedure Notes (Signed)
Procedure Name: Intubation Performed by: Casey BurkittHOANG, Keslie Gritz Pre-anesthesia Checklist: Emergency Drugs available, Patient identified, Suction available, Patient being monitored and Timeout performed Patient Re-evaluated:Patient Re-evaluated prior to inductionOxygen Delivery Method: Circle system utilized Preoxygenation: Pre-oxygenation with 100% oxygen Intubation Type: IV induction Ventilation: Mask ventilation without difficulty Laryngoscope Size: Mac and 3 Grade View: Grade II Tube type: Oral Tube size: 7.0 mm Number of attempts: 1 Airway Equipment and Method: Bougie stylet Placement Confirmation: positive ETCO2 and breath sounds checked- equal and bilateral Secured at: 22 cm Tube secured with: Tape Dental Injury: Teeth and Oropharynx as per pre-operative assessment

## 2016-02-04 NOTE — Op Note (Signed)
PROCEDURES: Excision of complex hidradenitis left axilla                              Excisional debridement of skin and subcutaneous tissue measuring 10 cm   Pre-operative Diagnosis: Hidradenitis left axilla  Post-operative Diagnosis: Same  Surgeon: Merri Rayiego F Jani Moronta    Anesthesia: General LMA anesthesia  ASA Class: 2   Surgeon: Patricia [name] Sheddrick Lattanzio , MD FACS  Anesthesia: Gen. with endotracheal tube Findings: Couplets adenitis sinus in the left axilla  Estimated Blood Loss: 5CC         Drains: NONE         Specimens: Hidradenitis       Complications: None               Procedure Details  The patient was seen again in the Holding Room. The benefits, complications, treatment options, and expected outcomes were discussed with the patient. The risks of bleeding, infection, recurrence of symptoms, failure to resolve symptoms,  bowel injury, any of which could require further surgery were reviewed with the patient.   The patient was taken to Operating Room, identified as Patricia [name] and the procedure verified.  A Time Out was held and the above information confirmed.  Prior to the induction of general anesthesia, antibiotic prophylaxis was administered. VTE prophylaxis was in place. General endotracheal anesthesia was then administered and tolerated well. After the induction, the left axilla was prepped and draped. The patient was positioned in the supine position. There was a significant fall site of hidradenitis in the left axilla with 2 places. Elliptical incision was used to incorporate both sinuses. Electrocautery was used to dissect this up in his tissue and removed all the hidradenitis sinuses. We did an excisional debridement using a curet in a sharp fashion and obtaining hemostasis with electrocautery. The wound was irrigated and quarter-inch packing was placed. MARCAINE .25% percent with epinephrine was injected for postoperative analgesia. Laparotomy counts were correct and there were  no medical conditions   Patricia [name] [name], MD, FACS

## 2016-02-05 NOTE — Anesthesia Postprocedure Evaluation (Signed)
Anesthesia Post Note  Patient: Patricia McgregorJanice F [name]  Procedure(s) Performed: Procedure(s) (LRB): EXCISION HIDRADENITIS AXILLA (Left)  Patient location during evaluation: PACU Anesthesia Type: General Level of consciousness: awake and alert and oriented Pain management: pain level controlled Vital Signs Assessment: post-procedure vital signs reviewed and stable Respiratory status: spontaneous breathing Cardiovascular status: blood pressure returned to baseline Anesthetic complications: no    Last Vitals:  Filed Vitals:   02/04/16 0907 02/04/16 0923  BP: 131/54 112/61  Pulse: 80 82  Temp: 36.7 C   Resp: 12 14    Last Pain:  Filed Vitals:   02/04/16 0931  PainSc: 0-No pain                 Erisha Paugh

## 2016-02-07 ENCOUNTER — Telehealth: Payer: Self-pay | Admitting: Surgery

## 2016-02-07 LAB — SURGICAL PATHOLOGY

## 2016-02-07 NOTE — Telephone Encounter (Signed)
Patient has called and would like to discuss packing and care of her wound. Patient had surgery with Dr Everlene FarrierPabon on 02/04/16--EXCISION HIDRADENITIS AXILLA.

## 2016-02-07 NOTE — Telephone Encounter (Signed)
Returned phone call to patient. She wound like detailed directions on how to pack the wound and how long to do this. This information was given to patient and she verbalizes understanding. Encouraged to call back with any further questions prior to scheduled appointment.

## 2016-02-16 ENCOUNTER — Other Ambulatory Visit: Payer: Self-pay

## 2016-02-21 ENCOUNTER — Telehealth: Payer: Self-pay | Admitting: Family Medicine

## 2016-02-21 ENCOUNTER — Ambulatory Visit (INDEPENDENT_AMBULATORY_CARE_PROVIDER_SITE_OTHER): Payer: No Typology Code available for payment source | Admitting: Surgery

## 2016-02-21 ENCOUNTER — Encounter: Payer: Self-pay | Admitting: Surgery

## 2016-02-21 DIAGNOSIS — Z09 Encounter for follow-up examination after completed treatment for conditions other than malignant neoplasm: Secondary | ICD-10-CM

## 2016-02-21 NOTE — Progress Notes (Signed)
Status post debridement and excision of hidradenitis in the left axilla on 4/7 Than very well. She is doing and packing in the left axilla. Pain free did have some reaction to the tape  PE NAD The axillary incision healing very well wound has epithelialized and there is some red beefy granulation tissue. No evidence of infection no evidence of recurrence.  A/P Doing very well, we'll just asked her to place a Telfa and 4 x 4. In a few days UltraCision to the large Band-Aid. RTC prn

## 2016-02-21 NOTE — Telephone Encounter (Signed)
Tetanus is recommended every ten years. Her last one was in 2004.  Make sure she checks with her insurance to make sure it is covered. Thanks

## 2016-02-21 NOTE — Telephone Encounter (Signed)
Ok. If it is covered I can just schedule it right?

## 2016-02-21 NOTE — Patient Instructions (Signed)
Please continue to place Telfa gauze over incision area for 2 more weeks or until area is closed completely. No restrictions at this time.

## 2016-02-21 NOTE — Telephone Encounter (Signed)
Pt called in stating she received a message on Mychart which stated she needs a Tetnus shot and is due since 2014. Please confirm. Thank you! Call pt @ [phone].

## 2016-02-22 ENCOUNTER — Ambulatory Visit (INDEPENDENT_AMBULATORY_CARE_PROVIDER_SITE_OTHER): Payer: No Typology Code available for payment source

## 2016-02-22 DIAGNOSIS — Z23 Encounter for immunization: Secondary | ICD-10-CM

## 2016-02-22 DIAGNOSIS — Z0000 Encounter for general adult medical examination without abnormal findings: Secondary | ICD-10-CM

## 2016-04-29 ENCOUNTER — Encounter: Payer: Self-pay | Admitting: Family Medicine

## 2016-10-12 ENCOUNTER — Emergency Department: Payer: Self-pay

## 2016-10-12 ENCOUNTER — Emergency Department
Admission: EM | Admit: 2016-10-12 | Discharge: 2016-10-12 | Disposition: A | Discharge status: Home / Self Care | Payer: Self-pay | Attending: Emergency Medicine | Admitting: Emergency Medicine

## 2016-10-12 ENCOUNTER — Encounter: Payer: Self-pay | Admitting: Emergency Medicine

## 2016-10-12 DIAGNOSIS — Y939 Activity, unspecified: Secondary | ICD-10-CM | POA: Insufficient documentation

## 2016-10-12 DIAGNOSIS — M1711 Unilateral primary osteoarthritis, right knee: Secondary | ICD-10-CM | POA: Insufficient documentation

## 2016-10-12 DIAGNOSIS — X501XXA Overexertion from prolonged static or awkward postures, initial encounter: Secondary | ICD-10-CM | POA: Insufficient documentation

## 2016-10-12 DIAGNOSIS — W19XXXA Unspecified fall, initial encounter: Secondary | ICD-10-CM

## 2016-10-12 DIAGNOSIS — Y9289 Other specified places as the place of occurrence of the external cause: Secondary | ICD-10-CM | POA: Insufficient documentation

## 2016-10-12 DIAGNOSIS — S8011XA Contusion of right lower leg, initial encounter: Secondary | ICD-10-CM

## 2016-10-12 DIAGNOSIS — S8001XA Contusion of right knee, initial encounter: Secondary | ICD-10-CM | POA: Insufficient documentation

## 2016-10-12 DIAGNOSIS — I10 Essential (primary) hypertension: Secondary | ICD-10-CM | POA: Insufficient documentation

## 2016-10-12 DIAGNOSIS — Y999 Unspecified external cause status: Secondary | ICD-10-CM | POA: Insufficient documentation

## 2016-10-12 DIAGNOSIS — F909 Attention-deficit hyperactivity disorder, unspecified type: Secondary | ICD-10-CM | POA: Insufficient documentation

## 2016-10-12 MED ORDER — IBUPROFEN 800 MG PO TABS: 800.0000 mg | ORAL_TABLET | Freq: Three times a day (TID) | ORAL | 0 refills | Status: DC | PRN

## 2016-10-12 MED ORDER — IBUPROFEN 800 MG PO TABS
800.0000 mg | ORAL_TABLET | Freq: Once | ORAL | Status: AC
  Administered 2016-10-12: 800 mg via ORAL
  Filled 2016-10-12: qty 1

## 2016-10-12 NOTE — ED Provider Notes (Signed)
[facility] Emergency Department Provider Note  ____________________________________________  Time seen: Approximately 12:48 PM  I have reviewed the triage vital signs and the nursing notes.   HISTORY  Chief Complaint Fall    HPI Patricia [name] is a 55 y.o. female , NAD, presents to the emergency department for evaluation of right knee pain after fall. Patient states she was wearing clogs and lost her balance causing her to fall onto her right knee. Believe she may have twisted one of her ankles but has no pain in her ankles or feet at this time. States she had significant pain about the anterior right knee at the time of the injury but states the pain is improving over time. Had a remote anterior cruciate ligament repair but no other surgeries about the knee. Does have history of chronic arthritis about the right knee as well. Denies any head injury, LOC, lightheadedness or dizziness. No visual changes. Denies acute neck, back, hip or pelvic pain. Does have a history of chronic lower back pain but states no changes since the injury today. No numbness, wheezes, tingling. Denies saddle paresthesias or loss of bowel or bladder control.   Past Medical History:  Diagnosis Date  . ADD (attention deficit disorder)   . Allergic rhinitis   . Anxiety   . Chronic sinusitis   . Complication of anesthesia   . Degenerative disc disease   . Depression   . Gastritis   . GERD (gastroesophageal reflux disease)   . Hidradenitis suppurativa   . Hx MRSA infection    PT STATES PCP TREATED HER WITH MUPIRICON FOR WHAT HE THOUGHT WAS MRSA > 1 YEAR AGO   . Hypertension    H/O IN 2015-LOST 80 IBS AND WENT OFF MEDS  THEN DUE TO CONTROL  . Laryngopharyngeal reflux (LPR)   . Obesity   . PONV (postoperative nausea and vomiting)    AFTER BACK SURGERIES  . Rectal bleeding    Followed by GI   . Rosacea     Patient Active Problem List   Diagnosis Date Noted  . Hidradenitis axillaris    . Neck muscle spasm 01/11/2016  . Sinusitis, acute frontal 10/05/2015  . Hemorrhoid 06/30/2015  . Diarrhea 06/15/2015  . Rectal bleeding 06/15/2015  . Atherosclerosis of native arteries of the extremities with intermittent claudication 08/01/2012  . Numbness and tingling of right arm 08/01/2012  . Decreased pulses in feet 07/13/2012  . MRSA nasal colonization 06/15/2012  . Hidradenitis suppurativa of left axilla 04/03/2012  . Skin lesion 08/29/2011  . Laryngopharyngeal reflux (LPR) 08/29/2011  . Gynecologic exam normal 02/14/2011  . Other screening mammogram 02/14/2011  . Routine general medical examination at a health care facility 02/08/2011  . ATTENTION DEFICIT DISORDER 09/01/2009  . OBESITY 10/28/2008  . EXCESSIVE OR FREQUENT MENSTRUATION 06/18/2008  . IRREGULAR MENSTRUAL CYCLE 05/14/2008  . TRANSAMINASES, SERUM, ELEVATED 02/24/2008  . ESSENTIAL HYPERTENSION 10/09/2007  . ANXIETY 02/21/2007  . Depression 02/21/2007  . Allergic rhinitis 02/21/2007  . GERD w/ Laryngopharyngeal reflux 02/21/2007  . ROSACEA 02/21/2007  . DEGENERATIVE DISC DISEASE 02/21/2007    Past Surgical History:  Procedure Laterality Date  . ANTERIOR CRUCIATE LIGAMENT REPAIR    . CESAREAN SECTION  Feb. 1995  . COLONOSCOPY  01/18/2012  . HYDRADENITIS EXCISION Left 02/04/2016   Procedure: EXCISION HIDRADENITIS AXILLA;  Surgeon: Leafy Roiego F Pabon, MD;  Location: [facility] ORS;  Service: General;  Laterality: Left;  . HYSTEROSCOPY  february 2014  . MICROLARYNGOSCOPY    .  PILONIDAL CYST DRAINAGE    . SPINAL FUSION  03/30/2009   L4 L5 S1 Lumbar  . SPINAL FUSION  Feb. 1997   Lumbar fusion  . TONSILLECTOMY    . UPPER GASTROINTESTINAL ENDOSCOPY  01/26/2009   Gastritis, GERD  . UPPER GI ENDOSCOPY  2010    Prior to Admission medications   Medication Sig Start Date End Date Taking? Authorizing Provider  ibuprofen (ADVIL,MOTRIN) 800 MG tablet Take 1 tablet (800 mg total) by mouth every 8 (eight) hours as needed (pain).  10/12/16   Hailei Besser L Shahidah Nesbitt, PA-C  omeprazole (PRILOSEC) 20 MG capsule Take 20 mg by mouth as needed.    Historical Provider, MD    Allergies Amphetamine-dextroamphet er; Bupropion hcl; Celecoxib; Fluticasone propionate; Septra [sulfamethoxazole-trimethoprim]; Sertraline hcl; and Tape  Family History  Problem Relation Age of Onset  . Esophageal cancer Father 12  . Cancer Father     esophageal CA,  tobacco user  . Mental retardation Father   . Colon cancer Maternal Grandmother   . Colon cancer Maternal Uncle   . Aneurysm Mother     2 abdominal aneurysms  . Hyperlipidemia Mother   . Hypertension Mother   . Heart disease Mother   . Stroke Mother     small stroke, x's  aneurysm   . Hypertension Sister   . Obesity Sister   . Hypertension Brother   . Hyperlipidemia Brother   . ADD / ADHD Son   . Breast cancer Maternal Aunt     Social History Social History  Substance Use Topics  . Smoking status: Never Smoker  . Smokeless tobacco: Never Used  . Alcohol use 2.4 oz/week    4 Glasses of wine per week     Comment: RARE     Review of Systems  Constitutional: No fever/chills Eyes: No visual changes.  Cardiovascular: No chest pain. Respiratory:  No shortness of breath.  Musculoskeletal: Positive right knee pain. Negative for neck, back, hip, pelvic pain.  Skin: Positive bruising and abrasion right knee. Negative for rash. Neurological: Negative for numbness, weakness, tingling. No saddle paresthesias or loss of bowel or bladder control. No LOC, lightheadedness, dizziness. 10-point ROS otherwise negative.  ____________________________________________   PHYSICAL EXAM:  VITAL SIGNS: ED Triage Vitals  Enc Vitals Group     BP 10/12/16 1245 134/67     Pulse Rate 10/12/16 1245 80     Resp 10/12/16 1245 20     Temp 10/12/16 1245 97.7 F (36.5 C)     Temp Source 10/12/16 1245 Oral     SpO2 10/12/16 1245 97 %     Weight 10/12/16 1245 230 lb (104.3 kg)     Height 10/12/16 1245  5\' 5"  (1.651 m)     Head Circumference --      Peak Flow --      Pain Score 10/12/16 1246 4     Pain Loc --      Pain Edu? --      Excl. in GC? --      Constitutional: Alert and oriented. Well appearing and in no acute distress. Eyes: Conjunctivae are normal.  Head: Atraumatic. Cardiovascular: Good peripheral circulation with 2+ pulses noted in bilateral lower extremities. Capillary refill is brisk in all digits of bilateral feet. Respiratory: Normal respiratory effort without tachypnea or retractions.  Musculoskeletal: Full range of motion of bilateral ankles, feet and toes without pain or difficulty. No laxity with anterior or posterior drawer nor varus or valgus stress of bilateral  ankles. Tenderness to palpation about the lateral, anterior portion of the right knee without any effusion or swelling noted. No laxity with anterior posterior drawer of the right knee. No laxity with varus valgus stress the right knee. Patient is able to fully extend the right knee without pain or difficulty. Patient does note some mild pain with full flexion of the right knee. No lower extremity tenderness nor edema.  No joint effusions. Neurologic:  Normal speech and language. No gross focal neurologic deficits are appreciated. Sensation to light touch grossly intact about bilateral lower extremities. Skin:  Trace blue ecchymosis noted about the right anterior knee with superficial nonbleeding abrasion. Skin is warm, dry and intact. No rash noted. Psychiatric: Mood and affect are normal. Speech and behavior are normal. Patient exhibits appropriate insight and judgement.   ____________________________________________   LABS  None ____________________________________________  EKG  None ____________________________________________  RADIOLOGY I, Hope PigeonJami L Deral Schellenberg, personally viewed and evaluated these images (plain radiographs) as part of my medical decision making, as well as reviewing the written report  by the radiologist.  Dg Knee Complete 4 Views Right  Result Date: 10/12/2016 CLINICAL DATA:  55 year old female status post fall in parking lot. Bruising and swelling. Initial encounter. Prior surgery. EXAM: RIGHT KNEE - COMPLETE 4+ VIEW COMPARISON:  None. FINDINGS: Moderate to severe tricompartmental degenerative spurring. Sequelae of ACL repair. Patellofemoral joint space loss. No joint effusion identified. The patella appears intact. No acute osseous abnormality identified. IMPRESSION: 1. No acute fracture or dislocation identified about the right knee. 2. Moderate to severe tricompartmental degeneration, probably maximal in the patellofemoral compartment. Previous Biochemist, clinicalACL repair. Electronically Signed   By: Odessa FlemingH  Hall M.D.   On: 10/12/2016 13:54    ____________________________________________    PROCEDURES  Procedure(s) performed: None   Procedures   Medications  ibuprofen (ADVIL,MOTRIN) tablet 800 mg (800 mg Oral Given 10/12/16 1308)     ____________________________________________   INITIAL IMPRESSION / ASSESSMENT AND PLAN / ED COURSE  Pertinent labs & imaging results that were available during my care of the patient were reviewed by me and considered in my medical decision making (see chart for details).  Clinical Course     Patient's diagnosis is consistent with Contusion of right knee due to fall and arthritis of the right knee. Patient was given oral ibuprofen while in the emergency department and tolerated well without side effects. Patient will be discharged home with prescriptions for ibuprofen to take as directed. May apply ice to the affected area 20 minutes 3-4 times daily as needed and elevate when not ambulating. Patient is to follow up with Dr. Rosita KeaMenz in orthopedics if symptoms persist past this treatment course. Patient is given ED precautions to return to the ED for any worsening or new symptoms.    ____________________________________________  FINAL CLINICAL  IMPRESSION(S) / ED DIAGNOSES  Final diagnoses:  Contusion of right knee and lower leg, initial encounter  Fall, initial encounter  Arthritis of right knee      NEW MEDICATIONS STARTED DURING THIS VISIT:  Discharge Medication List as of 10/12/2016  2:00 PM    START taking these medications   Details  ibuprofen (ADVIL,MOTRIN) 800 MG tablet Take 1 tablet (800 mg total) by mouth every 8 (eight) hours as needed (pain)., Starting Thu 10/12/2016, Print             Ernestene KielJami L FaunsdaleHagler, PA-C 10/12/16 1451    Emily FilbertJonathan E Williams, MD 10/12/16 1452

## 2016-10-12 NOTE — ED Triage Notes (Signed)
States she fell in the parking  Twisted her right ankle and fell  Landed on right knee

## 2016-11-17 ENCOUNTER — Telehealth: Payer: Self-pay

## 2016-11-17 NOTE — Telephone Encounter (Signed)
Please advise 

## 2016-11-17 NOTE — Telephone Encounter (Signed)
-----   Message from Glori LuisEric G Sonnenberg, MD sent at 11/15/2016  9:38 AM EST ----- It appears this patient had a mammogram ordered. Does not appear this is scheduled. Can you contact the patient to get this scheduled. Thanks. Minerva AreolaEric. ----- Message ----- From: SYSTEM Sent: 11/07/2016  12:07 AM To: Glori LuisEric G Sonnenberg, MD

## 2017-03-23 ENCOUNTER — Telehealth: Payer: Self-pay | Admitting: Family Medicine

## 2017-03-23 NOTE — Telephone Encounter (Signed)
Patient advised of below and verbalized understanding. She will seek treatment at urgent care center.

## 2017-03-23 NOTE — Telephone Encounter (Signed)
Pt called and stated that she has a place on her nose and a little spot inside her nose. She states that it is like a white head. States that she has had this before and they treated it like MRSA. She would like to know if Dr. Birdie SonsSonnenberg can call in mupirocin or does she need to be seen. Please advise, thank you!  Call pt @ [phone]

## 2017-03-23 NOTE — Telephone Encounter (Signed)
I would suggest evaluation for this to ensure she does not need oral antibiotics. Thanks.

## 2017-03-23 NOTE — Telephone Encounter (Signed)
Reason for call: nasal sore underneath nose  Symptoms: outside of right nostril scab, round place, redness , inside nasal entrance at entrance 2-3 lesions swollen , raised placed under nose, no drainage  Duration Saturday Medications:Mupirocin old Rx  Last seen for this problem:2013 Seen by: Dr Darrick Huntsmanullo  Please advise  [facility] No appointments available advised would probably need to go to urgent care but she would like for me to forward message to you for review.

## 2018-03-26 ENCOUNTER — Telehealth: Payer: Self-pay | Admitting: Family Medicine

## 2018-03-26 ENCOUNTER — Encounter: Payer: Self-pay | Admitting: Family Medicine

## 2018-03-26 ENCOUNTER — Ambulatory Visit: Payer: BLUE CROSS/BLUE SHIELD | Admitting: Family Medicine

## 2018-03-26 VITALS — BP 118/80 | HR 78 | Temp 97.9°F | Resp 16 | Wt 245.0 lb

## 2018-03-26 DIAGNOSIS — Z114 Encounter for screening for human immunodeficiency virus [HIV]: Secondary | ICD-10-CM

## 2018-03-26 DIAGNOSIS — Z6841 Body Mass Index (BMI) 40.0 and over, adult: Secondary | ICD-10-CM

## 2018-03-26 DIAGNOSIS — Z1159 Encounter for screening for other viral diseases: Secondary | ICD-10-CM

## 2018-03-26 DIAGNOSIS — Z5320 Procedure and treatment not carried out because of patient's decision for unspecified reasons: Secondary | ICD-10-CM

## 2018-03-26 DIAGNOSIS — I10 Essential (primary) hypertension: Secondary | ICD-10-CM

## 2018-03-26 DIAGNOSIS — Z1329 Encounter for screening for other suspected endocrine disorder: Secondary | ICD-10-CM

## 2018-03-26 DIAGNOSIS — Z13220 Encounter for screening for lipoid disorders: Secondary | ICD-10-CM

## 2018-03-26 DIAGNOSIS — M545 Low back pain: Secondary | ICD-10-CM

## 2018-03-26 DIAGNOSIS — Z862 Personal history of diseases of the blood and blood-forming organs and certain disorders involving the immune mechanism: Secondary | ICD-10-CM

## 2018-03-26 MED ORDER — CYCLOBENZAPRINE HCL 5 MG PO TABS: 5.0000 mg | ORAL_TABLET | Freq: Three times a day (TID) | ORAL | 1 refills | Status: DC | PRN

## 2018-03-26 MED ORDER — PREDNISONE 10 MG PO TABS: ORAL_TABLET | ORAL | 0 refills | Status: DC

## 2018-03-26 NOTE — Progress Notes (Signed)
Subjective:    Patient ID: Patricia [name], female    DOB: Jan 15, 1961, 57 y.o.   MRN: [id number]  HPI  Ms. Trapani is a 57 year old female who presents today with back pain that started two weeks ago She has a history of spinal fusion 12/1995 and 03/2009.  BACK PAIN  Location: left sided lower back pain  Quality: Rated as a 6 with movement and as a low as a 2 with sitting Onset: Two weeks ago Worse with: Movement    Better with:  Advil; she takes this twice daily that has provided benefit     Radiation: No new history of radiation. She reports prior episode of radiation to thighs related to prior back pain history. Trauma: No history of trauma. Best sitting/standing/leaning forward: No Trigger after recent yard work specifically use of weed eater and blower. She reports rushing to complete yard work with quite a bit of twisting and turning.    Red Flags Fecal/urinary incontinence: No Numbness/Weakness: No Fever/chills/sweats: No Night pain:  No Unexplained weight loss: No No relief with bedrest:  No h/o cancer/immunosuppression:  No IV drug use:  No PMH of osteoporosis or chronic steroid use: No  Review of Systems  Constitutional: Negative for chills, fatigue and fever.  Respiratory: Negative for cough, shortness of breath and wheezing.   Cardiovascular: Negative for chest pain and palpitations.  Gastrointestinal: Negative for abdominal pain, diarrhea, nausea and vomiting.  Musculoskeletal: Positive for back pain.  Skin: Negative for rash.  Neurological: Negative for weakness.   Past Medical History:  Diagnosis Date  . ADD (attention deficit disorder)   . Allergic rhinitis   . Anxiety   . Chronic sinusitis   . Complication of anesthesia   . Degenerative disc disease   . Depression   . Gastritis   . GERD (gastroesophageal reflux disease)   . Hidradenitis suppurativa   . Hx MRSA infection    PT STATES PCP TREATED HER WITH MUPIRICON FOR WHAT HE THOUGHT WAS MRSA > 1 YEAR AGO    . Hypertension    H/O IN 2015-LOST 80 IBS AND WENT OFF MEDS  THEN DUE TO CONTROL  . Laryngopharyngeal reflux (LPR)   . Obesity   . PONV (postoperative nausea and vomiting)    AFTER BACK SURGERIES  . Rectal bleeding    Followed by GI   . Rosacea      Social History   Socioeconomic History  . Marital status: Divorced    Spouse name: Not on file  . Number of children: Not on file  . Years of education: Not on file  . Highest education level: Not on file  Occupational History  . Occupation: account executive  Social Needs  . Financial resource strain: Not on file  . Food insecurity:    Worry: Not on file    Inability: Not on file  . Transportation needs:    Medical: Not on file    Non-medical: Not on file  Tobacco Use  . Smoking status: Never Smoker  . Smokeless tobacco: Never Used  Substance and Sexual Activity  . Alcohol use: Yes    Alcohol/week: 2.4 oz    Types: 4 Glasses of wine per week    Comment: RARE  . Drug use: No  . Sexual activity: Not on file  Lifestyle  . Physical activity:    Days per week: Not on file    Minutes per session: Not on file  . Stress: Not on file  Relationships  . Social connections:    Talks on phone: Not on file    Gets together: Not on file    Attends religious service: Not on file    Active member of club or organization: Not on file    Attends meetings of clubs or organizations: Not on file    Relationship status: Not on file  . Intimate partner violence:    Fear of current or ex partner: Not on file    Emotionally abused: Not on file    Physically abused: Not on file    Forced sexual activity: Not on file  Other Topics Concern  . Not on file  Social History Narrative   Daily caffeine     Past Surgical History:  Procedure Laterality Date  . ANTERIOR CRUCIATE LIGAMENT REPAIR    . CESAREAN SECTION  Feb. 1995  . COLONOSCOPY  01/18/2012  . HYDRADENITIS EXCISION Left 02/04/2016   Procedure: EXCISION HIDRADENITIS AXILLA;   Surgeon: Leafy Ro, MD;  Location: [facility] ORS;  Service: General;  Laterality: Left;  . HYSTEROSCOPY  february 2014  . MICROLARYNGOSCOPY    . PILONIDAL CYST DRAINAGE    . SPINAL FUSION  03/30/2009   L4 L5 S1 Lumbar  . SPINAL FUSION  Feb. 1997   Lumbar fusion  . TONSILLECTOMY    . UPPER GASTROINTESTINAL ENDOSCOPY  01/26/2009   Gastritis, GERD  . UPPER GI ENDOSCOPY  2010    Family History  Problem Relation Age of Onset  . Esophageal cancer Father 26  . Cancer Father        esophageal CA,  tobacco user  . Mental retardation Father   . Colon cancer Maternal Grandmother   . Colon cancer Maternal Uncle   . Aneurysm Mother        2 abdominal aneurysms  . Hyperlipidemia Mother   . Hypertension Mother   . Heart disease Mother   . Stroke Mother        small stroke, x's  aneurysm   . Hypertension Sister   . Obesity Sister   . Hypertension Brother   . Hyperlipidemia Brother   . ADD / ADHD Son   . Breast cancer Maternal Aunt     Allergies  Allergen Reactions  . Amphetamine-Dextroamphet Er     REACTION: not effective  . Bupropion Hcl     REACTION: irritability  . Celecoxib     REACTION: edema  . Fluticasone Propionate     REACTION: not effective  . Septra [Sulfamethoxazole-Trimethoprim] Swelling  . Sertraline Hcl     REACTION: wt gain  . Tape Rash    Current Outpatient Medications on File Prior to Visit  Medication Sig Dispense Refill  . ibuprofen (ADVIL,MOTRIN) 800 MG tablet Take 1 tablet (800 mg total) by mouth every 8 (eight) hours as needed (pain). 30 tablet 0   No current facility-administered medications on file prior to visit.     BP 118/80 (BP Location: Left Arm, Patient Position: Sitting, Cuff Size: Large)   Pulse 78   Temp 97.9 F (36.6 C) (Oral)   Resp 16   Wt 245 lb (111.1 kg)   LMP 11/29/2011   SpO2 98%   BMI 40.77 kg/m        Objective:   Physical Exam  Constitutional: She is oriented to person, place, and time. She appears well-developed and  well-nourished.  Eyes: Pupils are equal, round, and reactive to light. No scleral icterus.  Neck: Neck supple.  Cardiovascular: Normal  rate, regular rhythm and intact distal pulses.  Pulmonary/Chest: Effort normal and breath sounds normal. She has no wheezes. She has no rales.  Abdominal: Soft. Bowel sounds are normal. There is no tenderness.  Musculoskeletal:  Spine with normal alignment and no deformity. No tenderness to vertebral process with palpation with the exception of mild tenderness around L2-L3.  Paraspinous muscles are tender and patient notes this area as painful and gripping when walking. ROM is full at lumbar sacral regions. Negative Straight Leg raise. No CVA tenderness present. Able to heel/toe walk without pain.    Lymphadenopathy:    She has no cervical adenopathy.  Neurological: She is alert and oriented to person, place, and time. She has normal strength. No sensory deficit. Coordination and gait normal.  Reflex Scores:      Patellar reflexes are 2+ on the right side and 2+ on the left side. Skin: Skin is warm and dry. No rash noted.  Psychiatric: She has a normal mood and affect. Her behavior is normal. Judgment and thought content normal.      Assessment & Plan:  1. Acute left-sided low back pain, with sciatica presence unspecified Exam and history are reassuring. Symptoms are most consistent with musculoskeletal pain that was triggered after recent yard work. Patient reports that prior episodes of back pain were improved with prednisone therapy. We discussed use of antiinflammatory agents which improved symptoms and prednisone therapy. We opted for a short course of prednisone and use of cyclobenzaprine today. Advised against use of advil with prednisone. We discussed further options for treatment that include PT and imaging if symptoms do not resolve with prior history of spinal fusion. Return precautions provided.   - predniSONE (DELTASONE) 10 MG tablet; Take 4 tablets  once daily for 2 days, 3 tabs daily for 2 days, 2 tabs daily for 2 days, 1 tab daily for 2 days.  Dispense: 20 tablet; Refill: 0 - cyclobenzaprine (FLEXERIL) 5 MG tablet; Take 1 tablet (5 mg total) by mouth 3 (three) times daily as needed for muscle spasms.  Dispense: 30 tablet; Refill: 1  Roddie Mc, FNP-C

## 2018-03-26 NOTE — Patient Instructions (Signed)
It was a pleasure to see you today.  Please take medication as directed and if symptoms do not improve with treatment, worsen, or new symptoms develop, follow up for further evaluation and treatment.   Back Pain, Adult Many adults have back pain from time to time. Common causes of back pain include:  A strained muscle or ligament.  Wear and tear (degeneration) of the spinal disks.  Arthritis.  A hit to the back.  Back pain can be short-lived (acute) or last a long time (chronic). A physical exam, lab tests, and imaging studies may be done to find the cause of your pain. Follow these instructions at home: Managing pain and stiffness  Take over-the-counter and prescription medicines only as told by your health care provider.  If directed, apply heat to the affected area as often as told by your health care provider. Use the heat source that your health care provider recommends, such as a moist heat pack or a heating pad. ? Place a towel between your skin and the heat source. ? Leave the heat on for 20-30 minutes. ? Remove the heat if your skin turns bright red. This is especially important if you are unable to feel pain, heat, or cold. You have a greater risk of getting burned.  If directed, apply ice to the injured area: ? Put ice in a plastic bag. ? Place a towel between your skin and the bag. ? Leave the ice on for 20 minutes, 2-3 times a day for the first 2-3 days. Activity  Do not stay in bed. Resting more than 1-2 days can delay your recovery.  Take short walks on even surfaces as soon as you are able. Try to increase the length of time you walk each day.  Do not sit, drive, or stand in one place for more than 30 minutes at a time. Sitting or standing for long periods of time can put stress on your back.  Use proper lifting techniques. When you bend and lift, use positions that put less stress on your back: ? [place] your knees. ? Keep the load close to your body. ? Avoid  twisting.  Exercise regularly as told by your health care provider. Exercising will help your back heal faster. This also helps prevent back injuries by keeping muscles strong and flexible.  Your health care provider may recommend that you see a physical therapist. This person can help you come up with a safe exercise program. Do any exercises as told by your physical therapist. Lifestyle  Maintain a healthy weight. Extra weight puts stress on your back and makes it difficult to have good posture.  Avoid activities or situations that make you feel anxious or stressed. Learn ways to manage anxiety and stress. One way to manage stress is through exercise. Stress and anxiety increase muscle tension and can make back pain worse. General instructions  Sleep on a firm mattress in a comfortable position. Try lying on your side with your knees slightly bent. If you lie on your back, put a pillow under your knees.  Follow your treatment plan as told by your health care provider. This may include: ? Cognitive or behavioral therapy. ? Acupuncture or massage therapy. ? Meditation or yoga. Contact a health care provider if:  You have pain that is not relieved with rest or medicine.  You have increasing pain going down into your legs or buttocks.  Your pain does not improve in 2 weeks.  You have pain at  night.  You lose weight.  You have a fever or chills. Get help right away if:  You develop new bowel or bladder control problems.  You have unusual weakness or numbness in your arms or legs.  You develop nausea or vomiting.  You develop abdominal pain.  You feel faint. Summary  Many adults have back pain from time to time. A physical exam, lab tests, and imaging studies may be done to find the cause of your pain.  Use proper lifting techniques. When you bend and lift, use positions that put less stress on your back.  Take over-the-counter and prescription medicines and apply heat or  ice as directed by your health care provider. This information is not intended to replace advice given to you by your health care provider. Make sure you discuss any questions you have with your health care provider. Document Released: 10/16/2005 Document Revised: 11/20/2016 Document Reviewed: 11/20/2016 Elsevier Interactive Patient Education  [employer].

## 2018-03-26 NOTE — Telephone Encounter (Signed)
t would like to have fasting labs done before Physical appt. Please and Thank you!  Call pt @ [phone].

## 2018-03-27 NOTE — Telephone Encounter (Signed)
Orders placed.  Please let the patient know that she is due for hepatitis C screening based on the year that she was born in.  This is recommended screening for anybody born between [birth year] and 1966 as they are at increased risk.  I have ordered this though if she would not like to do this we will need to remove this order.  She is also due for HIV screening which is a once in lifetime screening for people between age 58 and 55.  I have placed an order for this.  If she would not like to do this please remove this order.  Thanks.

## 2018-03-27 NOTE — Telephone Encounter (Signed)
Please place orders

## 2018-03-27 NOTE — Telephone Encounter (Signed)
Patient scheduled and would like additional tests, she will check with her insurance for coverage as well.

## 2018-04-29 ENCOUNTER — Encounter: Payer: Self-pay | Admitting: Internal Medicine

## 2018-04-29 ENCOUNTER — Ambulatory Visit: Payer: BLUE CROSS/BLUE SHIELD | Admitting: Internal Medicine

## 2018-04-29 VITALS — BP 146/88 | HR 79 | Temp 98.1°F | Ht 65.0 in | Wt 247.0 lb

## 2018-04-29 DIAGNOSIS — M5416 Radiculopathy, lumbar region: Secondary | ICD-10-CM

## 2018-04-29 MED ORDER — IBUPROFEN 200 MG PO TABS: 200.0000 mg | ORAL_TABLET | Freq: Three times a day (TID) | ORAL | Status: DC | PRN

## 2018-04-29 MED ORDER — MELOXICAM 7.5 MG PO TABS: 7.5000 mg | ORAL_TABLET | Freq: Two times a day (BID) | ORAL | 2 refills | Status: DC | PRN

## 2018-04-29 MED ORDER — OXYCODONE-ACETAMINOPHEN 10-325 MG PO TABS: 1.0000 | ORAL_TABLET | Freq: Two times a day (BID) | ORAL | 0 refills | Status: DC | PRN

## 2018-04-29 NOTE — Progress Notes (Signed)
Pre visit review using our clinic review tool, if applicable. No additional management support is needed unless otherwise documented below in the visit note. 

## 2018-04-29 NOTE — Progress Notes (Signed)
Chief Complaint  Patient presents with  . Back Pain   Acute visit  C/o new acute on chronic low back pain worse since 02/2018 was eval here given flexeril 5 tid not helping Advil 200 mg taking up to 4 prn. Back pain worse with twisting motion with moving lawn 02/2018 and worse last week after mowing 5/10 today.  She is having anterior thigh numbness b/l and numbness in right big toe, no leg weakness no bowel/bladder incontinence..   Reviewed MRI 02/06/09 moderate to severe spinal stenosis L3/4, broad based disc bulging mild forminal narrowing b/l L3/4 R>L L2/3 disc bulge mild foraminal narrowing  L2/3 s/p back surgery in 1997 and L4/5-S1 fusion 03/30/09 s/p PLIF L4/S1 Dr. Channing Muttersoy.  Percocet 10/325 has helped prn in the past.   Review of Systems  Constitutional: Negative for weight loss.  HENT: Negative for hearing loss.   Eyes: Negative for blurred vision.  Respiratory: Negative for shortness of breath.   Cardiovascular: Negative for chest pain.  Genitourinary:       Denies urinary incontinence   Musculoskeletal: Positive for back pain.  Neurological: Positive for sensory change.   Past Medical History:  Diagnosis Date  . ADD (attention deficit disorder)   . Allergic rhinitis   . Anxiety   . Chronic sinusitis   . Complication of anesthesia   . Degenerative disc disease   . Depression   . Gastritis   . GERD (gastroesophageal reflux disease)   . Hidradenitis suppurativa   . Hx MRSA infection    PT STATES PCP TREATED HER WITH MUPIRICON FOR WHAT HE THOUGHT WAS MRSA > 1 YEAR AGO   . Hypertension    H/O IN 2015-LOST 80 IBS AND WENT OFF MEDS  THEN DUE TO CONTROL  . Laryngopharyngeal reflux (LPR)   . Obesity   . PONV (postoperative nausea and vomiting)    AFTER BACK SURGERIES  . Rectal bleeding    Followed by GI   . Rosacea    Past Surgical History:  Procedure Laterality Date  . ANTERIOR CRUCIATE LIGAMENT REPAIR    . CESAREAN SECTION  Feb. 1995  . COLONOSCOPY  01/18/2012  .  HYDRADENITIS EXCISION Left 02/04/2016   Procedure: EXCISION HIDRADENITIS AXILLA;  Surgeon: Leafy Roiego F Pabon, MD;  Location: [facility] ORS;  Service: General;  Laterality: Left;  . HYSTEROSCOPY  february 2014  . MICROLARYNGOSCOPY    . PILONIDAL CYST DRAINAGE    . SPINAL FUSION  03/30/2009   L4 L5 S1 Lumbar  . SPINAL FUSION  Feb. 1997   Lumbar fusion  . TONSILLECTOMY    . UPPER GASTROINTESTINAL ENDOSCOPY  01/26/2009   Gastritis, GERD  . UPPER GI ENDOSCOPY  2010   Family History  Problem Relation Age of Onset  . Esophageal cancer Father [id number]  . Cancer Father        esophageal CA,  tobacco user  . Mental retardation Father   . Colon cancer Maternal Grandmother   . Colon cancer Maternal Uncle   . Aneurysm Mother        2 abdominal aneurysms  . Hyperlipidemia Mother   . Hypertension Mother   . Heart disease Mother   . Stroke Mother        small stroke, x's  aneurysm   . Hypertension Sister   . Obesity Sister   . Hypertension Brother   . Hyperlipidemia Brother   . ADD / ADHD Son   . Breast cancer Maternal Aunt    Social History  Socioeconomic History  . Marital status: Divorced    Spouse name: Not on file  . Number of children: Not on file  . Years of education: Not on file  . Highest education level: Not on file  Occupational History  . Occupation: account executive  Social Needs  . Financial resource strain: Not on file  . Food insecurity:    Worry: Not on file    Inability: Not on file  . Transportation needs:    Medical: Not on file    Non-medical: Not on file  Tobacco Use  . Smoking status: Never Smoker  . Smokeless tobacco: Never Used  Substance and Sexual Activity  . Alcohol use: Yes    Alcohol/week: 2.4 oz    Types: 4 Glasses of wine per week    Comment: RARE  . Drug use: No  . Sexual activity: Not on file  Lifestyle  . Physical activity:    Days per week: Not on file    Minutes per session: Not on file  . Stress: Not on file  Relationships  . Social  connections:    Talks on phone: Not on file    Gets together: Not on file    Attends religious service: Not on file    Active member of club or organization: Not on file    Attends meetings of clubs or organizations: Not on file    Relationship status: Not on file  . Intimate partner violence:    Fear of current or ex partner: Not on file    Emotionally abused: Not on file    Physically abused: Not on file    Forced sexual activity: Not on file  Other Topics Concern  . Not on file  Social History Narrative   Daily caffeine    Current Meds  Medication Sig  . cyclobenzaprine (FLEXERIL) 5 MG tablet Take 1 tablet (5 mg total) by mouth 3 (three) times daily as needed for muscle spasms.  . fluticasone (FLONASE) 50 MCG/ACT nasal spray Place into both nostrils daily.  Marland Kitchen ibuprofen (ADVIL,MOTRIN) 200 MG tablet Take 1-4 tablets (200-800 mg total) by mouth every 8 (eight) hours as needed (pain).  . [DISCONTINUED] ibuprofen (ADVIL,MOTRIN) 800 MG tablet Take 1 tablet (800 mg total) by mouth every 8 (eight) hours as needed (pain). (Patient taking differently: Take 200 mg by mouth every 8 (eight) hours as needed (pain). )   Allergies  Allergen Reactions  . Amphetamine-Dextroamphet Er     REACTION: not effective  . Bupropion Hcl     REACTION: irritability  . Celecoxib     REACTION: edema  . Septra [Sulfamethoxazole-Trimethoprim] Swelling  . Sertraline Hcl     REACTION: wt gain  . Tape Rash   No results found for this or any previous visit (from the past 2160 hour(s)). Objective  Body mass index is 41.1 kg/m. Wt Readings from Last 3 Encounters:  04/29/18 247 lb (112 kg)  03/26/18 245 lb (111.1 kg)  10/12/16 230 lb (104.3 kg)   Temp Readings from Last 3 Encounters:  04/29/18 98.1 F (36.7 C) (Oral)  03/26/18 97.9 F (36.6 C) (Oral)  10/12/16 97.7 F (36.5 C) (Oral)   BP Readings from Last 3 Encounters:  04/29/18 (!) 146/88  03/26/18 118/80  10/12/16 130/60   Pulse Readings  from Last 3 Encounters:  04/29/18 79  03/26/18 78  10/12/16 75    Physical Exam  Constitutional: She is oriented to person, place, and time. She appears well-developed and  well-nourished. She is cooperative.  HENT:  Head: Normocephalic and atraumatic.  Mouth/Throat: Oropharynx is clear and moist and mucous membranes are normal.  Eyes: Pupils are equal, round, and reactive to light. Conjunctivae are normal.  Cardiovascular: Normal rate, regular rhythm and normal heart sounds.  Pulmonary/Chest: Effort normal and breath sounds normal.  Musculoskeletal:       Lumbar back: She exhibits tenderness.  Left lower back and mid back ttp on exam neg str8 leg test b/l   Neurological: She is alert and oriented to person, place, and time. Gait normal.  Skin: Skin is warm, dry and intact.  Psychiatric: She has a normal mood and affect. Her speech is normal and behavior is normal. Judgment and thought content normal. Cognition and memory are normal.  Nursing note and vitals reviewed.   Assessment   1. Lumbar radiculopathy s/p back surgery x 2 see HPI and MRI lumbar see HPI Plan   1.  Prn flexeril  Stop advil and rx mobic has taken before no issues  Pt declines steroids  Prn percocet 5 day supply 10-325 Refer Dr. Channing Mutters NS  sch MRI low back   Provider: Dr. French Ana McLean-Scocuzza-[facility] Medicine

## 2018-04-29 NOTE — Patient Instructions (Addendum)
We ordered MRI and referral to Dr. Channing Muttersoy   Lumbosacral Radiculopathy Lumbosacral radiculopathy is a condition that involves the spinal nerves and nerve roots in the low back and bottom of the spine. The condition develops when these nerves and nerve roots move out of place or become inflamed and cause symptoms. What are the causes? This condition may be caused by:  Pressure from a disk that bulges out of place (herniated disk). A disk is a plate of cartilage that separates bones in the spine.  Disk degeneration.  A narrowing of the bones of the lower back (spinal stenosis).  A tumor.  An infection.  An injury that places sudden pressure on the disks that cushion the bones of your lower spine.  What increases the risk? This condition is more likely to develop in:  Males aged 30-50 years.  Females aged 50-60 years.  People who lift improperly.  People who are overweight or live a sedentary lifestyle.  People who smoke.  People who perform repetitive activities that strain the spine.  What are the signs or symptoms? Symptoms of this condition include:  Pain that goes down from the back into the legs (sciatica). This is the most common symptom. The pain may be worse with sitting, coughing, or sneezing.  Pain and numbness in the arms and legs.  Muscle weakness.  Tingling.  Loss of bladder control or bowel control.  How is this diagnosed? This condition is diagnosed with a physical exam and medical history. If the pain is lasting, you may have tests, such as:  MRI scan.  X-ray.  CT scan.  Myelogram.  Nerve conduction study.  How is this treated? This condition is often treated with:  Hot packs and ice applied to affected areas.  Stretches to improve flexibility.  Exercises to strengthen back muscles.  Physical therapy.  Pain medicine.  A steroid injection in the spine.  In some cases, no treatment is needed. If the condition is long-lasting  (chronic), or if symptoms are severe, treatment may involve surgery or lifestyle changes, such as following a weight loss plan. Follow these instructions at home: Medicines  Take medicines only as directed by your health care provider.  Do not drive or operate heavy machinery while taking pain medicine. Injury care  Apply a heat pack to the injured area as directed by your health care provider.  Apply ice to the affected area: ? Put ice in a plastic bag. ? Place a towel between your skin and the bag. ? Leave the ice on for 20-30 minutes, every 2 hours while you are awake or as needed. Or, leave the ice on for as long as directed by your health care provider. Other Instructions  If you were shown how to do any exercises or stretches, do them as directed by your health care provider.  If your health care provider prescribed a diet or exercise program, follow it as directed.  Keep all follow-up visits as directed by your health care provider. This is important. Contact a health care provider if:  Your pain does not improve over time even when taking pain medicines. Get help right away if:  Your develop severe pain.  Your pain suddenly gets worse.  You develop increasing weakness in your legs.  You lose the ability to control your bladder or bowel.  You have difficulty walking or balancing.  You have a fever. This information is not intended to replace advice given to you by your health care provider.  Make sure you discuss any questions you have with your health care provider. Document Released: 10/16/2005 Document Revised: 03/23/2016 Document Reviewed: 10/12/2014 Elsevier Interactive Patient Education  [employer].

## 2018-05-08 ENCOUNTER — Other Ambulatory Visit: Payer: Self-pay | Admitting: Internal Medicine

## 2018-05-08 DIAGNOSIS — M5416 Radiculopathy, lumbar region: Secondary | ICD-10-CM

## 2018-05-13 ENCOUNTER — Ambulatory Visit
Admission: RE | Admit: 2018-05-13 | Discharge: 2018-05-13 | Disposition: A | Discharge status: Home / Self Care | Payer: BLUE CROSS/BLUE SHIELD | Source: Ambulatory Visit | Attending: Internal Medicine | Admitting: Internal Medicine

## 2018-05-13 DIAGNOSIS — M5416 Radiculopathy, lumbar region: Secondary | ICD-10-CM

## 2018-05-16 ENCOUNTER — Ambulatory Visit
Admit: 2018-05-16 | Discharge: 2018-05-17 | Payer: PRIVATE HEALTH INSURANCE | Attending: Neurological Surgery | Primary: Neurological Surgery

## 2018-05-16 DIAGNOSIS — M4716 Other spondylosis with myelopathy, lumbar region: Principal | ICD-10-CM

## 2018-05-17 ENCOUNTER — Other Ambulatory Visit: Payer: Self-pay | Admitting: Neurosurgery

## 2018-05-17 DIAGNOSIS — M4716 Other spondylosis with myelopathy, lumbar region: Secondary | ICD-10-CM

## 2018-05-22 ENCOUNTER — Other Ambulatory Visit (INDEPENDENT_AMBULATORY_CARE_PROVIDER_SITE_OTHER): Payer: BLUE CROSS/BLUE SHIELD

## 2018-05-22 ENCOUNTER — Ambulatory Visit: Payer: BLUE CROSS/BLUE SHIELD

## 2018-05-22 DIAGNOSIS — Z13220 Encounter for screening for lipoid disorders: Secondary | ICD-10-CM | POA: Diagnosis not present

## 2018-05-22 DIAGNOSIS — Z862 Personal history of diseases of the blood and blood-forming organs and certain disorders involving the immune mechanism: Secondary | ICD-10-CM

## 2018-05-22 DIAGNOSIS — Z6841 Body Mass Index (BMI) 40.0 and over, adult: Secondary | ICD-10-CM

## 2018-05-22 DIAGNOSIS — Z1159 Encounter for screening for other viral diseases: Secondary | ICD-10-CM

## 2018-05-22 DIAGNOSIS — I10 Essential (primary) hypertension: Secondary | ICD-10-CM

## 2018-05-22 DIAGNOSIS — Z114 Encounter for screening for human immunodeficiency virus [HIV]: Secondary | ICD-10-CM

## 2018-05-22 DIAGNOSIS — Z1329 Encounter for screening for other suspected endocrine disorder: Secondary | ICD-10-CM

## 2018-05-23 LAB — HEMOGLOBIN A1C
Hgb A1c MFr Bld: 5.6 % of total Hgb (ref <5.7)
Mean Plasma Glucose: 114 (calc)
eAG (mmol/L): 6.3 (calc)

## 2018-05-23 LAB — CBC
HCT: 40.2 % (ref 35.0–45.0)
HEMOGLOBIN: 13.9 g/dL (ref 11.7–15.5)
MCH: 31.9 pg (ref 27.0–33.0)
MCHC: 34.6 g/dL (ref 32.0–36.0)
MCV: 92.2 fL (ref 80.0–100.0)
MPV: 9.7 fL (ref 7.5–12.5)
Platelets: 228 10*3/uL (ref 140–400)
RBC: 4.36 10*6/uL (ref 3.80–5.10)
RDW: 11.9 % (ref 11.0–15.0)
WBC: 6.7 10*3/uL (ref 3.8–10.8)

## 2018-05-23 LAB — COMPREHENSIVE METABOLIC PANEL
AG Ratio: 1.2 (calc) (ref 1.0–2.5)
ALBUMIN MSPROF: 3.8 g/dL (ref 3.6–5.1)
ALT: 28 U/L (ref 6–29)
AST: 28 U/L (ref 10–35)
Alkaline phosphatase (APISO): 83 U/L (ref 33–130)
BUN: 10 mg/dL (ref 7–25)
CHLORIDE: 108 mmol/L (ref 98–110)
CO2: 28 mmol/L (ref 20–32)
Calcium: 8.7 mg/dL (ref 8.6–10.4)
Creat: 0.55 mg/dL (ref 0.50–1.05)
GLOBULIN: 3.1 g/dL (ref 1.9–3.7)
Glucose, Bld: 106 mg/dL — ABNORMAL HIGH (ref 65–99)
POTASSIUM: 4.6 mmol/L (ref 3.5–5.3)
Sodium: 145 mmol/L (ref 135–146)
Total Bilirubin: 0.4 mg/dL (ref 0.2–1.2)
Total Protein: 6.9 g/dL (ref 6.1–8.1)

## 2018-05-23 LAB — LIPID PANEL
Cholesterol: 160 mg/dL (ref <200)
HDL: 48 mg/dL — AB (ref >50)
LDL Cholesterol (Calc): 93 mg/dL (calc)
Non-HDL Cholesterol (Calc): 112 mg/dL (calc) (ref <130)
TRIGLYCERIDES: 98 mg/dL (ref <150)
Total CHOL/HDL Ratio: 3.3 (calc) (ref <5.0)

## 2018-05-23 LAB — HEPATITIS C ANTIBODY
HEP C AB: NONREACTIVE
SIGNAL TO CUT-OFF: 0.03 (ref <1.00)

## 2018-05-23 LAB — TSH: TSH: 1.78 m[IU]/L (ref 0.40–4.50)

## 2018-05-23 LAB — HIV ANTIBODY (ROUTINE TESTING W REFLEX): HIV 1&2 Ab, 4th Generation: NONREACTIVE

## 2018-05-24 ENCOUNTER — Encounter: Payer: Self-pay | Admitting: Family Medicine

## 2018-05-24 ENCOUNTER — Ambulatory Visit (INDEPENDENT_AMBULATORY_CARE_PROVIDER_SITE_OTHER): Payer: BLUE CROSS/BLUE SHIELD | Admitting: Family Medicine

## 2018-05-24 VITALS — BP 114/70 | HR 80 | Temp 98.1°F | Ht 64.5 in | Wt 246.2 lb

## 2018-05-24 DIAGNOSIS — R41840 Attention and concentration deficit: Secondary | ICD-10-CM | POA: Diagnosis not present

## 2018-05-24 DIAGNOSIS — Z1239 Encounter for other screening for malignant neoplasm of breast: Secondary | ICD-10-CM

## 2018-05-24 DIAGNOSIS — Z1231 Encounter for screening mammogram for malignant neoplasm of breast: Secondary | ICD-10-CM | POA: Diagnosis not present

## 2018-05-24 DIAGNOSIS — L732 Hidradenitis suppurativa: Secondary | ICD-10-CM

## 2018-05-24 DIAGNOSIS — Z0001 Encounter for general adult medical examination with abnormal findings: Secondary | ICD-10-CM

## 2018-05-24 NOTE — Assessment & Plan Note (Signed)
Physical exam completed.  Will refer to gynecology for pelvic exam and breast exam at patient request.  She will work on dietary changes.  She work on exercise when she is able to following back injection.  Mammogram ordered.  She was given contact information to set this up.  Lab work from recently reviewed.

## 2018-05-24 NOTE — Assessment & Plan Note (Signed)
No significant signs of infection.  She will use warm compresses.  She will contact her dermatologist for follow-up.

## 2018-05-24 NOTE — Patient Instructions (Signed)
Nice to see you. We will you refer to psychology for retesting for ADHD. We will refer you to gynecology as well for yearly exam. Please contact the breast center for your mammogram Please work on dietary changes and exercise. Please contact her dermatologist for follow-up.

## 2018-05-24 NOTE — Progress Notes (Signed)
Marikay Alar, MD Phone: [phone]  Patricia [name] is a 57 y.o. female who presents today for CPE.  Not exercising related to chronic back pain.  She is following with neurosurgery for that. She does eat meal replacement shakes at times.  Needs to continue to work on diet. Pap smear 2015 negative for cells and HPV.  She wants to follow-up with GYN for Pap smear.  She is postmenopausal since 2016. Mammogram in 2017.  Unable to see result.  Reports a history of colon cancer in her maternal grandmother though no first-degree relatives.  Maternal aunt with breast cancer.  No family history of ovarian cancer. Colonoscopy 2013 with external hemorrhoids.  No polyps. No tobacco use or illicit drug use.  She has an alcoholic beverage 1-2 times a year. Sees an ophthalmologist once every year or 2.  Dentist twice yearly.  She has hidradenitis in her bilateral axillae.  She has one spot that does stay open.  No significant pain.  She saw dermatology previously for this and had one of these removed.  She reports history of ADHD versus memory issues and depression.  She was on Vyvanse previously.  She just feels like she is not able to focus very much.  She denies depression.  Much improved from previously.  No SI.  Active Ambulatory Problems    Diagnosis Date Noted  . OBESITY 10/28/2008  . ATTENTION DEFICIT DISORDER 09/01/2009  . Essential hypertension 10/09/2007  . Allergic rhinitis 02/21/2007  . GERD w/ Laryngopharyngeal reflux 02/21/2007  . ROSACEA 02/21/2007  . DEGENERATIVE DISC DISEASE 02/21/2007  . Encounter for general adult medical examination with abnormal findings 02/08/2011  . Laryngopharyngeal reflux (LPR) 08/29/2011  . Decreased pulses in feet 07/13/2012  . Atherosclerosis of native arteries of the extremities with intermittent claudication 08/01/2012  . Hemorrhoid 06/30/2015  . Hidradenitis axillaris   . Attention deficit 05/24/2018   Resolved Ambulatory Problems   Diagnosis Date Noted  . LEUKOCYTOSIS 10/14/2007  . ANXIETY 02/21/2007  . Depression 02/21/2007  . SINUSITIS, CHRONIC 02/21/2007  . Excessive or frequent menstruation 06/18/2008  . Irregular menstrual cycle 05/14/2008  . HIDRADENITIS SUPPURATIVA 06/18/2008  . TRANSAMINASES, SERUM, ELEVATED 02/24/2008  . Gynecologic exam normal 02/14/2011  . Other screening mammogram 02/14/2011  . Skin lesion 08/29/2011  . Hidradenitis suppurativa of left axilla 04/03/2012  . Upper respiratory infection 06/15/2012  . MRSA nasal colonization 06/15/2012  . Numbness and tingling of right arm 08/01/2012  . Diarrhea 06/15/2015  . Rectal bleeding 06/15/2015  . Sinusitis, acute frontal 10/05/2015  . Neck muscle spasm 01/11/2016   Past Medical History:  Diagnosis Date  . ADD (attention deficit disorder)   . Allergic rhinitis   . Anxiety   . Chronic sinusitis   . Complication of anesthesia   . Degenerative disc disease   . Depression   . Gastritis   . GERD (gastroesophageal reflux disease)   . Hidradenitis suppurativa   . Hx MRSA infection   . Hypertension   . Laryngopharyngeal reflux (LPR)   . MRSA nasal colonization 06/15/2012  . Obesity   . PONV (postoperative nausea and vomiting)   . Rectal bleeding   . Rosacea     Family History  Problem Relation Age of Onset  . Esophageal cancer Father 39  . Cancer Father        esophageal CA,  tobacco user  . Mental retardation Father   . Colon cancer Maternal Grandmother   . Colon cancer Maternal Uncle   .  Aneurysm Mother        2 abdominal aneurysms  . Hyperlipidemia Mother   . Hypertension Mother   . Heart disease Mother   . Stroke Mother        small stroke, x's  aneurysm   . Hypertension Sister   . Obesity Sister   . Hypertension Brother   . Hyperlipidemia Brother   . ADD / ADHD Son   . Breast cancer Maternal Aunt     Social History   Socioeconomic History  . Marital status: Divorced    Spouse name: Not on file  . Number of  children: Not on file  . Years of education: Not on file  . Highest education level: Not on file  Occupational History  . Occupation: account executive  Social Needs  . Financial resource strain: Not on file  . Food insecurity:    Worry: Not on file    Inability: Not on file  . Transportation needs:    Medical: Not on file    Non-medical: Not on file  Tobacco Use  . Smoking status: Never Smoker  . Smokeless tobacco: Never Used  Substance and Sexual Activity  . Alcohol use: Yes    Alcohol/week: 2.4 oz    Types: 4 Glasses of wine per week    Comment: RARE  . Drug use: No  . Sexual activity: Not on file  Lifestyle  . Physical activity:    Days per week: Not on file    Minutes per session: Not on file  . Stress: Not on file  Relationships  . Social connections:    Talks on phone: Not on file    Gets together: Not on file    Attends religious service: Not on file    Active member of club or organization: Not on file    Attends meetings of clubs or organizations: Not on file    Relationship status: Not on file  . Intimate partner violence:    Fear of current or ex partner: Not on file    Emotionally abused: Not on file    Physically abused: Not on file    Forced sexual activity: Not on file  Other Topics Concern  . Not on file  Social History Narrative   Daily caffeine     ROS  General:  Negative for nexplained weight loss, fever Skin: Positive for sore that will not heal (hidradenitis), negative for new or changing mole HEENT: Negative for trouble hearing, trouble seeing, ringing in ears, mouth sores, hoarseness, change in voice, dysphagia. CV:  Negative for chest pain, dyspnea, edema, palpitations Resp: Negative for cough, dyspnea, hemoptysis GI: Negative for nausea, vomiting, diarrhea, constipation, abdominal pain, melena, hematochezia. GU: Negative for dysuria, incontinence, urinary hesitance, hematuria, vaginal or penile discharge, polyuria, sexual difficulty,  lumps in testicle or breasts MSK: Negative for muscle cramps or aches, joint pain or swelling Neuro: Negative for headaches, weakness, numbness, dizziness, passing out/fainting Psych: Negative for depression, anxiety, positive for memory problems  Objective  Physical Exam Vitals:   05/24/18 1434  BP: 114/70  Pulse: 80  Temp: 98.1 F (36.7 C)  SpO2: 98%    BP Readings from Last 3 Encounters:  05/24/18 114/70  04/29/18 (!) 146/88  03/26/18 118/80   Wt Readings from Last 3 Encounters:  05/24/18 246 lb 3.2 oz (111.7 kg)  04/29/18 247 lb (112 kg)  03/26/18 245 lb (111.1 kg)    Physical Exam  Constitutional: No distress.  HENT:  Head: Normocephalic  and atraumatic.  Mouth/Throat: Oropharynx is clear and moist.  Eyes: Pupils are equal, round, and reactive to light.  Cardiovascular: Normal rate, regular rhythm and normal heart sounds.  Pulmonary/Chest: Effort normal and breath sounds normal.  Abdominal: Soft. Bowel sounds are normal. She exhibits no distension. There is no tenderness.  Musculoskeletal: She exhibits no edema.  Neurological: She is alert.  Skin: Skin is warm and dry. She is not diaphoretic.  Scars from hidradenitis noted, 2 small pustules in her right armpit, nontender, no surrounding erythema, no fluctuance     Assessment/Plan:   Encounter for general adult medical examination with abnormal findings Physical exam completed.  Will refer to gynecology for pelvic exam and breast exam at patient request.  She will work on dietary changes.  She work on exercise when she is able to following back injection.  Mammogram ordered.  She was given contact information to set this up.  Lab work from recently reviewed.  Hidradenitis axillaris No significant signs of infection.  She will use warm compresses.  She will contact her dermatologist for follow-up.  Attention deficit Refer to psychology for retesting for ADHD.   Orders Placed This Encounter  Procedures  . MM  3D SCREEN BREAST BILATERAL    Standing Status:   Future    Standing Expiration Date:   07/26/2019    Order Specific Question:   Reason for Exam (SYMPTOM  OR DIAGNOSIS REQUIRED)    Answer:   breast cancer screening    Order Specific Question:   Is the patient pregnant?    Answer:   No    Order Specific Question:   Preferred imaging location?    Answer:   [facility]  . Ambulatory referral to Gynecology    Referral Priority:   Routine    Referral Type:   Consultation    Referral Reason:   Specialty Services Required    Requested Specialty:   Gynecology    Number of Visits Requested:   1  . Ambulatory referral to Psychology    Referral Priority:   Routine    Referral Type:   Psychiatric    Referral Reason:   Specialty Services Required    Requested Specialty:   Psychology    Number of Visits Requested:   1    No orders of the defined types were placed in this encounter.    Marikay AlarEric Sonnenberg, MD [facility]

## 2018-05-24 NOTE — Assessment & Plan Note (Signed)
Refer to psychology for retesting for ADHD.

## 2018-05-27 ENCOUNTER — Ambulatory Visit
Admission: RE | Admit: 2018-05-27 | Discharge: 2018-05-27 | Disposition: A | Discharge status: Home / Self Care | Payer: BLUE CROSS/BLUE SHIELD | Source: Ambulatory Visit | Attending: Neurosurgery | Admitting: Neurosurgery

## 2018-05-27 DIAGNOSIS — M4716 Other spondylosis with myelopathy, lumbar region: Secondary | ICD-10-CM

## 2018-05-27 MED ORDER — IOPAMIDOL (ISOVUE-M 200) INJECTION 41%
1.0000 mL | Freq: Once | INTRAMUSCULAR | Status: AC
  Administered 2018-05-27: 1 mL via EPIDURAL

## 2018-05-27 MED ORDER — METHYLPREDNISOLONE ACETATE 40 MG/ML INJ SUSP (RADIOLOG
120.0000 mg | Freq: Once | INTRAMUSCULAR | Status: AC
  Administered 2018-05-27: 120 mg via EPIDURAL

## 2018-05-27 NOTE — Discharge Instructions (Signed)

## 2018-06-14 ENCOUNTER — Encounter: Payer: BLUE CROSS/BLUE SHIELD | Admitting: Family Medicine

## 2018-06-18 ENCOUNTER — Encounter
Admit: 2018-06-18 | Discharge: 2018-06-19 | Payer: PRIVATE HEALTH INSURANCE | Attending: Neurological Surgery | Primary: Neurological Surgery

## 2018-06-18 DIAGNOSIS — M4716 Other spondylosis with myelopathy, lumbar region: Principal | ICD-10-CM

## 2018-06-19 ENCOUNTER — Encounter: Payer: Self-pay | Admitting: Obstetrics and Gynecology

## 2018-06-24 ENCOUNTER — Encounter: Payer: Self-pay | Admitting: Obstetrics and Gynecology

## 2018-06-24 ENCOUNTER — Other Ambulatory Visit (HOSPITAL_COMMUNITY)
Admission: RE | Admit: 2018-06-24 | Discharge: 2018-06-24 | Disposition: A | Discharge status: Home / Self Care | Payer: BLUE CROSS/BLUE SHIELD | Source: Ambulatory Visit | Attending: Obstetrics and Gynecology | Admitting: Obstetrics and Gynecology

## 2018-06-24 ENCOUNTER — Ambulatory Visit (INDEPENDENT_AMBULATORY_CARE_PROVIDER_SITE_OTHER): Payer: BLUE CROSS/BLUE SHIELD | Admitting: Obstetrics and Gynecology

## 2018-06-24 VITALS — BP 120/74 | HR 83 | Ht 65.0 in | Wt 250.0 lb

## 2018-06-24 DIAGNOSIS — Z124 Encounter for screening for malignant neoplasm of cervix: Secondary | ICD-10-CM | POA: Insufficient documentation

## 2018-06-24 DIAGNOSIS — Z1239 Encounter for other screening for malignant neoplasm of breast: Secondary | ICD-10-CM

## 2018-06-24 DIAGNOSIS — Z01419 Encounter for gynecological examination (general) (routine) without abnormal findings: Secondary | ICD-10-CM

## 2018-06-24 DIAGNOSIS — Z1231 Encounter for screening mammogram for malignant neoplasm of breast: Secondary | ICD-10-CM

## 2018-06-24 DIAGNOSIS — Z1151 Encounter for screening for human papillomavirus (HPV): Secondary | ICD-10-CM | POA: Diagnosis not present

## 2018-06-24 NOTE — Progress Notes (Signed)
PCP: Glori LuisSonnenberg, Eric G, MD   Chief Complaint  Patient presents with  . Gynecologic Exam    [facility] but [facility], pt has had hot flashes x 1 month    HPI:      Patricia [name] is a 57 y.o. No obstetric history on file. who LMP was Patient's last menstrual period was 11/29/2011., presents today for her breast exam/ pap smear, referred by PCP.  Her menses are absent due to menopause. She does not have intermenstrual bleeding. She does have occas vasomotor sx, sx worse recently.   Sex activity: not sexually active. She does not have vaginal dryness.  Last Pap: July 15, 2014  Results were: no abnormalities /neg HPV DNA.   Last mammogram: not recent; plans to sched through PCP There is a FH of breast cancer in her mat aunt, genetic testing not indicated. There is no FH of ovarian cancer. The patient does not do self-breast exams.  Colonoscopy: colonoscopy 6 years ago without abnormalities. Repeat due after 10 years.   Labs with PCP.   Past Medical History:  Diagnosis Date  . ADD (attention deficit disorder)   . Allergic rhinitis   . Anxiety   . Chronic sinusitis   . Complication of anesthesia   . Degenerative disc disease   . Depression   . Gastritis   . GERD (gastroesophageal reflux disease)   . Hidradenitis suppurativa   . Hx MRSA infection    PT STATES PCP TREATED HER WITH MUPIRICON FOR WHAT HE THOUGHT WAS MRSA > 1 YEAR AGO   . Hypertension    H/O IN 2015-LOST 80 IBS AND WENT OFF MEDS  THEN DUE TO CONTROL  . Laryngopharyngeal reflux (LPR)   . MRSA nasal colonization 06/15/2012  . Obesity   . PONV (postoperative nausea and vomiting)    AFTER BACK SURGERIES  . Rectal bleeding    Followed by GI   . Rosacea     Past Surgical History:  Procedure Laterality Date  . ANTERIOR CRUCIATE LIGAMENT REPAIR    . CESAREAN SECTION  Feb. 1995  . COLONOSCOPY  01/18/2012  . HYDRADENITIS EXCISION Left 02/04/2016   Procedure: EXCISION HIDRADENITIS AXILLA;   Surgeon: Leafy Roiego F Pabon, MD;  Location: [facility] ORS;  Service: General;  Laterality: Left;  . HYSTEROSCOPY  february 2014  . MICROLARYNGOSCOPY    . PILONIDAL CYST DRAINAGE    . SPINAL FUSION  03/30/2009   L4 L5 S1 Lumbar  . SPINAL FUSION  Feb. 1997   Lumbar fusion  . TONSILLECTOMY    . UPPER GASTROINTESTINAL ENDOSCOPY  01/26/2009   Gastritis, GERD  . UPPER GI ENDOSCOPY  2010    Family History  Problem Relation Age of Onset  . Esophageal cancer Father 2957  . Cancer Father        esophageal CA,  tobacco user  . Mental retardation Father   . Colon cancer Maternal Grandmother [id number]  . Colon cancer Maternal Uncle   . Aneurysm Mother        2 abdominal aneurysms  . Hyperlipidemia Mother   . Hypertension Mother   . Heart disease Mother   . Stroke Mother        small stroke, x's  aneurysm   . Hypertension Sister   . Obesity Sister   . Hypertension Brother   . Hyperlipidemia Brother   . ADD / ADHD Son   . Breast cancer Maternal Aunt 3655    Social  History   Socioeconomic History  . Marital status: Divorced    Spouse name: Not on file  . Number of children: Not on file  . Years of education: Not on file  . Highest education level: Not on file  Occupational History  . Occupation: account executive  Social Needs  . Financial resource strain: Not on file  . Food insecurity:    Worry: Not on file    Inability: Not on file  . Transportation needs:    Medical: Not on file    Non-medical: Not on file  Tobacco Use  . Smoking status: Never Smoker  . Smokeless tobacco: Never Used  Substance and Sexual Activity  . Alcohol use: Not Currently    Alcohol/week: 4.0 standard drinks    Types: 4 Glasses of wine per week    Comment: RARE  . Drug use: No  . Sexual activity: Not Currently    Birth control/protection: Post-menopausal  Lifestyle  . Physical activity:    Days per week: Not on file    Minutes per session: Not on file  . Stress: Not on file  Relationships  . Social  connections:    Talks on phone: Not on file    Gets together: Not on file    Attends religious service: Not on file    Active member of club or organization: Not on file    Attends meetings of clubs or organizations: Not on file    Relationship status: Not on file  . Intimate partner violence:    Fear of current or ex partner: Not on file    Emotionally abused: Not on file    Physically abused: Not on file    Forced sexual activity: Not on file  Other Topics Concern  . Not on file  Social History Narrative   Daily caffeine     Outpatient Medications Prior to Visit  Medication Sig Dispense Refill  . calcium carbonate (TUMS) 500 MG chewable tablet Chew by mouth.    . cyclobenzaprine (FLEXERIL) 5 MG tablet Take 1 tablet (5 mg total) by mouth 3 (three) times daily as needed for muscle spasms. 30 tablet 1  . fluticasone (FLONASE) 50 MCG/ACT nasal spray Place into both nostrils daily.    Marland Kitchen ibuprofen (ADVIL,MOTRIN) 800 MG tablet Take by mouth.    . meloxicam (MOBIC) 7.5 MG tablet Take 1 tablet (7.5 mg total) by mouth 2 (two) times daily as needed for pain. 60 tablet 2  . oxyCODONE-acetaminophen (PERCOCET) 10-325 MG tablet Take 1 tablet by mouth 2 (two) times daily as needed for pain. (Patient not taking: Reported on 06/24/2018) 10 tablet 0   No facility-administered medications prior to visit.        ROS:  Review of Systems  Constitutional: Negative for fatigue, fever and unexpected weight change.  Respiratory: Negative for cough, shortness of breath and wheezing.   Cardiovascular: Negative for chest pain, palpitations and leg swelling.  Gastrointestinal: Negative for blood in stool, constipation, diarrhea, nausea and vomiting.  Endocrine: Negative for cold intolerance, heat intolerance and polyuria.  Genitourinary: Negative for dyspareunia, dysuria, flank pain, frequency, genital sores, hematuria, menstrual problem, pelvic pain, urgency, vaginal bleeding, vaginal discharge and  vaginal pain.  Musculoskeletal: Negative for back pain, joint swelling and myalgias.  Skin: Negative for rash.  Neurological: Negative for dizziness, syncope, light-headedness, numbness and headaches.  Hematological: Negative for adenopathy.  Psychiatric/Behavioral: Negative for agitation, confusion, sleep disturbance and suicidal ideas. The patient is not nervous/anxious.   BREAST:  No symptoms   Objective: BP 120/74   Pulse 83   Ht 5\' 5"  (1.651 m)   Wt 250 lb (113.4 kg)   LMP 11/29/2011   BMI 41.60 kg/m    Physical Exam  Constitutional: She is oriented to person, place, and time. She appears well-developed and well-nourished.  Genitourinary: Vagina normal and uterus normal. There is no rash or tenderness on the right labia. There is no rash or tenderness on the left labia. No erythema or tenderness in the vagina. No vaginal discharge found. Right adnexum does not display mass and does not display tenderness. Left adnexum does not display mass and does not display tenderness. Cervix does not exhibit motion tenderness or polyp. Uterus is not enlarged or tender.  Neck: Normal range of motion. No thyromegaly present.  Cardiovascular:  No murmur heard. Pulmonary/Chest: Right breast exhibits no mass, no nipple discharge, no skin change and no tenderness. Left breast exhibits no mass, no nipple discharge, no skin change and no tenderness.  Abdominal: Soft. There is no tenderness. There is no guarding.  Musculoskeletal: Normal range of motion.  Neurological: She is alert and oriented to person, place, and time. No cranial nerve deficit.  Psychiatric: She has a normal mood and affect. Her behavior is normal.  Vitals reviewed.   Assessment/Plan:  Encounter for annual routine gynecological examination  Cervical cancer screening - Plan: Cytology - PAP  Screening for HPV (human papillomavirus) - Plan: Cytology - PAP  Screening for breast cancer - Pt to sched mammo.         GYN counsel  breast self exam, mammography screening, menopause, adequate intake of calcium and vitamin D, diet and exercise    F/U  Return in about 1 year (around 06/25/2019).  Patricia Narula B. Roniya Tetro, PA-C 06/24/2018 10:49 AM

## 2018-06-24 NOTE — Patient Instructions (Signed)
I value your feedback and entrusting us with your care. If you get a [facility] patient survey, I would appreciate you taking the time to let us know about your experience today. Thank you! 

## 2018-06-26 LAB — CYTOLOGY - PAP
ADEQUACY: ABSENT
DIAGNOSIS: NEGATIVE
HPV (WINDOPATH): NOT DETECTED

## 2018-07-08 ENCOUNTER — Ambulatory Visit
Admission: RE | Admit: 2018-07-08 | Discharge: 2018-07-08 | Disposition: A | Discharge status: Home / Self Care | Payer: BLUE CROSS/BLUE SHIELD | Source: Ambulatory Visit | Attending: Family Medicine | Admitting: Family Medicine

## 2018-07-08 DIAGNOSIS — Z1239 Encounter for other screening for malignant neoplasm of breast: Secondary | ICD-10-CM

## 2018-07-08 DIAGNOSIS — Z1231 Encounter for screening mammogram for malignant neoplasm of breast: Secondary | ICD-10-CM | POA: Insufficient documentation

## 2018-07-16 ENCOUNTER — Other Ambulatory Visit: Payer: Self-pay | Admitting: *Deleted

## 2018-07-16 ENCOUNTER — Inpatient Hospital Stay
Admission: RE | Admit: 2018-07-16 | Discharge: 2018-07-16 | Disposition: A | Discharge status: Home / Self Care | Payer: Self-pay | Source: Ambulatory Visit | Attending: *Deleted | Admitting: *Deleted

## 2018-07-16 DIAGNOSIS — Z9289 Personal history of other medical treatment: Secondary | ICD-10-CM

## 2018-08-15 ENCOUNTER — Encounter: Payer: Self-pay | Admitting: Family Medicine

## 2018-08-15 ENCOUNTER — Ambulatory Visit: Payer: BLUE CROSS/BLUE SHIELD | Admitting: Family Medicine

## 2018-08-15 ENCOUNTER — Ambulatory Visit (INDEPENDENT_AMBULATORY_CARE_PROVIDER_SITE_OTHER): Payer: BLUE CROSS/BLUE SHIELD

## 2018-08-15 ENCOUNTER — Telehealth: Payer: Self-pay | Admitting: Family Medicine

## 2018-08-15 VITALS — BP 142/86 | HR 88 | Temp 98.4°F | Ht 65.0 in | Wt 253.0 lb

## 2018-08-15 DIAGNOSIS — E559 Vitamin D deficiency, unspecified: Secondary | ICD-10-CM

## 2018-08-15 DIAGNOSIS — Z6841 Body Mass Index (BMI) 40.0 and over, adult: Secondary | ICD-10-CM

## 2018-08-15 DIAGNOSIS — M25511 Pain in right shoulder: Secondary | ICD-10-CM | POA: Diagnosis not present

## 2018-08-15 DIAGNOSIS — M79641 Pain in right hand: Secondary | ICD-10-CM

## 2018-08-15 DIAGNOSIS — G8929 Other chronic pain: Secondary | ICD-10-CM

## 2018-08-15 DIAGNOSIS — R5383 Other fatigue: Secondary | ICD-10-CM | POA: Diagnosis not present

## 2018-08-15 DIAGNOSIS — M79601 Pain in right arm: Secondary | ICD-10-CM

## 2018-08-15 DIAGNOSIS — R768 Other specified abnormal immunological findings in serum: Secondary | ICD-10-CM

## 2018-08-15 DIAGNOSIS — K219 Gastro-esophageal reflux disease without esophagitis: Secondary | ICD-10-CM

## 2018-08-15 DIAGNOSIS — E66813 Obesity, class 3: Secondary | ICD-10-CM

## 2018-08-15 DIAGNOSIS — M2550 Pain in unspecified joint: Secondary | ICD-10-CM

## 2018-08-15 LAB — VITAMIN D 25 HYDROXY (VIT D DEFICIENCY, FRACTURES): VITD: 21.08 ng/mL — ABNORMAL LOW (ref 30.00–100.00)

## 2018-08-15 LAB — COMPREHENSIVE METABOLIC PANEL
ALT: 27 U/L (ref 0–35)
AST: 26 U/L (ref 0–37)
Albumin: 4.1 g/dL (ref 3.5–5.2)
Alkaline Phosphatase: 71 U/L (ref 39–117)
BUN: 17 mg/dL (ref 6–23)
CHLORIDE: 102 meq/L (ref 96–112)
CO2: 30 meq/L (ref 19–32)
Calcium: 10 mg/dL (ref 8.4–10.5)
Creatinine, Ser: 0.63 mg/dL (ref 0.40–1.20)
GFR: 103.22 mL/min (ref >60.00)
Glucose, Bld: 114 mg/dL — ABNORMAL HIGH (ref 70–99)
POTASSIUM: 4 meq/L (ref 3.5–5.1)
Sodium: 142 mEq/L (ref 135–145)
Total Bilirubin: 0.6 mg/dL (ref 0.2–1.2)
Total Protein: 7.7 g/dL (ref 6.0–8.3)

## 2018-08-15 LAB — CBC
HEMATOCRIT: 39.5 % (ref 36.0–46.0)
HEMOGLOBIN: 13.6 g/dL (ref 12.0–15.0)
MCHC: 34.5 g/dL (ref 30.0–36.0)
MCV: 91.8 fl (ref 78.0–100.0)
Platelets: 233 10*3/uL (ref 150.0–400.0)
RBC: 4.31 Mil/uL (ref 3.87–5.11)
RDW: 12.5 % (ref 11.5–15.5)
WBC: 8.4 10*3/uL (ref 4.0–10.5)

## 2018-08-15 LAB — SEDIMENTATION RATE: SED RATE: 40 mm/h — AB (ref 0–30)

## 2018-08-15 LAB — B12 AND FOLATE PANEL: Vitamin B-12: 538 pg/mL (ref 211–911)

## 2018-08-15 LAB — C-REACTIVE PROTEIN: CRP: 2.9 mg/dL (ref 0.5–20.0)

## 2018-08-15 MED ORDER — MELOXICAM 15 MG PO TABS: 15.0000 mg | ORAL_TABLET | Freq: Every day | ORAL | 1 refills | Status: DC

## 2018-08-15 NOTE — Progress Notes (Signed)
l °

## 2018-08-15 NOTE — Progress Notes (Signed)
Subjective:    Patient ID: Patricia [name], female    DOB: September 29, 1961, 57 y.o.   MRN: [id number]  HPI   Patient presents to clinic complaining of chronic pain and right shoulder, right arm and right hand.  States she has had this pain for many months.  Also complains of some mild pain in left shoulder and also low back, but these have been present for years.  Patient states she has been told by her orthopedic that she has osteoarthritis in her low back, so she suspects it could also be osteoarthritis in her other joints. Patient has used meloxicam 7.5mg  with some effect, but ran out of this medication.   Also reports feeling fatigued. States this has also gone on for months. Was recently imformed the well she was drinking from at her rental had testing positive for bacteria (she has drank this water for over a year) and is concerned.   Is interested also in losing weight. In past lost 80 pounds doing a 5 meal bar a day program with one meal in the evening or protein and greens. States she never did the transition to weight maintenance and ended up gaining all the weight back.   Patient suspects her weight is contributing to her fatigue.    Patient Active Problem List   Diagnosis Date Noted  . Attention deficit 05/24/2018  . Hidradenitis axillaris   . Hemorrhoid 06/30/2015  . Atherosclerosis of native arteries of the extremities with intermittent claudication 08/01/2012  . Decreased pulses in feet 07/13/2012  . Laryngopharyngeal reflux (LPR) 08/29/2011  . Encounter for general adult medical examination with abnormal findings 02/08/2011  . ATTENTION DEFICIT DISORDER 09/01/2009  . OBESITY 10/28/2008  . Essential hypertension 10/09/2007  . Allergic rhinitis 02/21/2007  . GERD w/ Laryngopharyngeal reflux 02/21/2007  . ROSACEA 02/21/2007  . DEGENERATIVE DISC DISEASE 02/21/2007   Social History   Tobacco Use  . Smoking status: Never Smoker  . Smokeless tobacco: Never Used  Substance Use  Topics  . Alcohol use: Not Currently    Alcohol/week: 4.0 standard drinks    Types: 4 Glasses of wine per week    Comment: RARE   Review of Systems  Constitutional: +fatigue. Negative for chills,  fever.  HENT: Negative for congestion, ear pain, sinus pain and sore throat.   Eyes: Negative.   Respiratory: Negative for cough, shortness of breath and wheezing.   Cardiovascular: Negative for chest pain, palpitations and leg swelling.  Gastrointestinal: Negative for abdominal pain, diarrhea, nausea and vomiting.  Genitourinary: Negative for dysuria, frequency and urgency.  Musculoskeletal: Multiple joint pain, mainly right shoulder, arm, hand. Skin: Negative for color change, pallor and rash.  Neurological: Negative for syncope, light-headedness and headaches.  Psychiatric/Behavioral: The patient is not nervous/anxious.       Objective:   Physical Exam  Constitutional: She is oriented to person, place, and time. No distress.  HENT:  Head: Normocephalic and atraumatic.  Eyes: EOM are normal. No scleral icterus.  Neck: Neck supple. No tracheal deviation present.  Cardiovascular: Normal rate and regular rhythm.  Pulmonary/Chest: Effort normal and breath sounds normal. No respiratory distress.  Abdominal: Soft. Bowel sounds are normal. She exhibits no distension. There is no tenderness.  Musculoskeletal: She exhibits tenderness. She exhibits no edema.  Able to raise arm straight up above head, reach across chest & hold arm straight out - these movements do cause pain. Grips equal and strong. ROM of elbows and all fingers on both hands intact.  Neurological: She is alert and oriented to person, place, and time. No cranial nerve deficit.  Skin: Skin is warm and dry. Capillary refill takes less than 2 seconds. No pallor.  Psychiatric: She has a normal mood and affect. Her behavior is normal.  Nursing note and vitals reviewed.     Vitals:   08/15/18 0929  BP: (!) 142/86  Pulse: 88    Temp: 98.4 F (36.9 C)  SpO2: 94%   Body mass index is 42.1 kg/m.  Assessment & Plan:   Fatigue -- Lab work including CBC, CMP, Vit D and B12/folate ordered to further eval.   Chronic pain in multiple joints/pain in right shoulder, arm, hand -- We will get shoulder xray of R shoulder. In labs we will include ANA, RF, Sed Rate and CRP. We will increase meloxicam to 15mg  per day to help control pain.  Obesity, BMI >40 -- Patient given diet plan in her patient instructions. Advised that diet plans that replace all meals with bars are hard to maintain because at some point we need to go back to a more regular diet. Less calories, low carb/low sugar, high protein/high veggie and regular exercise are the best ways to lose weight and maintain weight loss.  Keep regular follow up in November as planned.

## 2018-08-15 NOTE — Telephone Encounter (Signed)
Called Pt to tell her that her Rx is sent to the pharmacy

## 2018-08-15 NOTE — Patient Instructions (Signed)
This is  Dr. Tullo's  example of a  "Low GI"  Diet:  It will allow you to lose 4 to 8  lbs  per month if you follow it carefully.  Your goal with exercise is a minimum of 30 minutes of aerobic exercise 5 days per week (Walking does not count once it becomes easy!)    All of the foods can be found at grocery stores and in bulk at [employer].  The Atkins protein bars and shakes are available in more varieties at Target, WalMart and Lowe's Foods.     7 AM Breakfast:  Choose from the following:  Low carbohydrate Protein  Shakes (I recommend the  Premier Protein chocolate shakes,  EAS AdvantEdge "Carb Control" shakes  Or the Atkins shakes all are under 3 net carbs)     a scrambled egg/bacon/cheese burrito made with Mission's "carb balance" whole wheat tortilla  (about 10 net carbs )  Jimmy Deans sells microwaveable frittata (basically a quiche without the pastry crust) that is eaten cold and very convenient way to get your eggs.  8 carbs)  If you make your own protein shakes, avoid bananas and pineapple,  And use low carb greek yogurt or original /unsweetened almond or soy milk    Avoid cereal and bananas, oatmeal and cream of wheat and grits. They are loaded with carbohydrates!   10 AM: high protein snack:  Protein bar by Atkins (the snack size, under 200 cal, usually < 6 net carbs).    A stick of cheese:  Around 1 carb,  100 cal     Dannon Light n Fit Greek Yogurt  (80 cal, 8 carbs)  Other so called "protein bars" and Greek yogurts tend to be loaded with carbohydrates.  Remember, in food advertising, the word "energy" is synonymous for " carbohydrate."  Lunch:   A Sandwich using the bread choices listed, Can use any  Eggs,  lunchmeat, grilled meat or canned tuna), avocado, regular mayo/mustard  and cheese.  A Salad using blue cheese, ranch,  Goddess or vinagrette,  Avoid taco shells, croutons or "confetti" and no "candied nuts" but regular nuts OK.   No pretzels, nabs  or chips.  Pickles and  miniature sweet peppers are a good low carb alternative that provide a "crunch"  The bread is the only source of carbohydrate in a sandwich and  can be decreased by trying some of the attached alternatives to traditional loaf bread   Avoid "Low fat dressings, as well as [place] and [place] dressings They are loaded with sugar!   3 PM/ Mid day  Snack:  Consider  1 ounce of  almonds, walnuts, pistachios, pecans, peanuts,  Macadamia nuts or a nut medley.  Avoid "granola and granola bars "  Mixed nuts are ok in moderation as long as there are no raisins,  cranberries or dried fruit.   KIND bars are OK if you get the low glycemic index variety   Try the prosciutto/mozzarella cheese sticks by Fiorruci  In deli /backery section   High protein      6 PM  Dinner:     Meat/fowl/fish with a green salad, and either broccoli, cauliflower, green beans, spinach, brussel sprouts or  Lima beans. DO NOT BREAD THE PROTEIN!!      There is a low carb pasta by Dreamfield's that is acceptable and tastes great: only 5 digestible carbs/serving.( All grocery stores but BJs carry it ) Several ready made meals are   available low carb:   Try Michel Angelo's chicken piccata or chicken or eggplant parm over low carb pasta.(Lowes and BJs)   Aaron Sanchez's "Carnitas" (pulled pork, no sauce,  0 carbs) or his beef pot roast to make a dinner burrito (at BJ's)  Pesto over low carb pasta (bj's sells a good quality pesto in the center refrigerated section of the deli   Try satueeing  Bok Choy with mushroooms as a good side   Green Giant makes a mashed cauliflower that tastes like mashed potatoes  Whole wheat pasta is still full of digestible carbs and  Not as low in glycemic index as Dreamfield's.   Brown rice is still rice,  So skip the rice and noodles if you eat Chinese or Thai (or at least limit to 1/2 cup)  9 PM snack :   Breyer's "low carb" fudgsicle or  ice cream bar (Carb Smart line), or  Weight Watcher's ice  cream bar , or another "no sugar added" ice cream;  a serving of fresh berries/cherries with whipped cream   Cheese or DANNON'S LlGHT N FIT GREEK YOGURT  8 ounces of [place] unsweetened almond/cococunut milk    Treat yourself to a parfait made with whipped cream blueberiies, walnuts and vanilla greek yogurt  Avoid bananas, pineapple, grapes  and watermelon on a regular basis because they are high in sugar.  THINK OF THEM AS DESSERT  Remember that snack Substitutions should be less than 10 NET carbs per serving and meals < 20 carbs. Remember to subtract fiber grams to get the "net carbs."   

## 2018-08-15 NOTE — Telephone Encounter (Signed)
Copied from CRM [id number]. Topic: Quick Communication - See Telephone Encounter >> Aug 15, 2018 11:37 AM Arlyss Gandy, NT wrote: CRM for notification. See Telephone encounter for: 08/15/18. Pt states she was seen this morning and Lauren was going to send in 15mg  Meloxicam. Pt is checking status on that medication being sent to the pharmacy.

## 2018-08-16 MED ORDER — ERGOCALCIFEROL 50 MCG (2000 UT) PO TABS: 1.0000 | ORAL_TABLET | Freq: Every day | ORAL | 5 refills | Status: DC

## 2018-08-16 NOTE — Addendum Note (Signed)
Addended by: Leanora Cover on: 08/16/2018 03:19 PM   Modules accepted: Orders

## 2018-08-19 LAB — ANTI-NUCLEAR AB-TITER (ANA TITER)

## 2018-08-19 LAB — RHEUMATOID FACTOR

## 2018-08-19 LAB — THYROID PANEL WITH TSH
FREE THYROXINE INDEX: 2.2 (ref 1.4–3.8)
T3 UPTAKE: 23 % (ref 22–35)
T4 TOTAL: 9.4 ug/dL (ref 5.1–11.9)
TSH: 1.91 mIU/L (ref 0.40–4.50)

## 2018-08-19 LAB — ANA: ANA: POSITIVE — AB

## 2018-08-20 NOTE — Addendum Note (Signed)
Addended by: Leanora Cover on: 08/20/2018 12:49 PM   Modules accepted: Orders

## 2018-08-23 ENCOUNTER — Encounter: Payer: Self-pay | Admitting: Family Medicine

## 2018-09-03 ENCOUNTER — Ambulatory Visit (INDEPENDENT_AMBULATORY_CARE_PROVIDER_SITE_OTHER): Payer: BLUE CROSS/BLUE SHIELD | Admitting: Psychology

## 2018-09-03 DIAGNOSIS — F341 Dysthymic disorder: Secondary | ICD-10-CM

## 2018-09-03 DIAGNOSIS — F909 Attention-deficit hyperactivity disorder, unspecified type: Secondary | ICD-10-CM | POA: Diagnosis not present

## 2018-09-05 ENCOUNTER — Encounter: Payer: Self-pay | Admitting: Family Medicine

## 2018-09-10 ENCOUNTER — Ambulatory Visit: Payer: BLUE CROSS/BLUE SHIELD | Admitting: Family Medicine

## 2018-09-18 ENCOUNTER — Other Ambulatory Visit: Payer: Self-pay | Admitting: Nurse Practitioner

## 2018-09-18 ENCOUNTER — Ambulatory Visit
Admission: RE | Admit: 2018-09-18 | Discharge: 2018-09-18 | Disposition: A | Discharge status: Home / Self Care | Payer: BLUE CROSS/BLUE SHIELD | Source: Ambulatory Visit | Attending: Nurse Practitioner | Admitting: Nurse Practitioner

## 2018-09-18 ENCOUNTER — Encounter: Admit: 2018-09-18 | Discharge: 2018-09-19 | Payer: PRIVATE HEALTH INSURANCE

## 2018-09-18 DIAGNOSIS — M47812 Spondylosis without myelopathy or radiculopathy, cervical region: Secondary | ICD-10-CM

## 2018-09-18 DIAGNOSIS — M4716 Other spondylosis with myelopathy, lumbar region: Secondary | ICD-10-CM

## 2018-10-15 ENCOUNTER — Ambulatory Visit (INDEPENDENT_AMBULATORY_CARE_PROVIDER_SITE_OTHER): Payer: BLUE CROSS/BLUE SHIELD | Admitting: Psychology

## 2018-10-15 DIAGNOSIS — F341 Dysthymic disorder: Secondary | ICD-10-CM | POA: Diagnosis not present

## 2018-10-17 ENCOUNTER — Ambulatory Visit: Payer: BLUE CROSS/BLUE SHIELD | Admitting: Internal Medicine

## 2018-10-17 ENCOUNTER — Encounter: Payer: Self-pay | Admitting: Internal Medicine

## 2018-10-17 VITALS — BP 132/77 | HR 83 | Ht 65.0 in | Wt 256.0 lb

## 2018-10-17 DIAGNOSIS — M3500 Sicca syndrome, unspecified: Secondary | ICD-10-CM | POA: Diagnosis not present

## 2018-10-17 DIAGNOSIS — R1310 Dysphagia, unspecified: Secondary | ICD-10-CM | POA: Diagnosis not present

## 2018-10-17 DIAGNOSIS — K219 Gastro-esophageal reflux disease without esophagitis: Secondary | ICD-10-CM | POA: Diagnosis not present

## 2018-10-17 DIAGNOSIS — M4722 Other spondylosis with radiculopathy, cervical region: Secondary | ICD-10-CM

## 2018-10-17 DIAGNOSIS — Z791 Long term (current) use of non-steroidal anti-inflammatories (NSAID): Secondary | ICD-10-CM

## 2018-10-17 MED ORDER — ESOMEPRAZOLE MAGNESIUM 40 MG PO CPDR: 40.0000 mg | DELAYED_RELEASE_CAPSULE | Freq: Every day | ORAL | 3 refills | Status: DC

## 2018-10-17 NOTE — Patient Instructions (Addendum)
We have sent the following medications to your pharmacy for you to pick up at your convenience: Generic Nexium  Check out the Diet Dr website.   Follow up with Dr Leone PayorGessner as needed.   I appreciate the opportunity to care for you. Stan Headarl Gessner, MD, [facility]

## 2018-10-17 NOTE — Progress Notes (Signed)
Patricia [name] 57 y.o. Dec 27, 1960 [id number]  Assessment & Plan:   Encounter Diagnoses  Name Primary?  . Pill dysphagia Yes  . Laryngopharyngeal reflux (LPR)   . Sjogren's syndrome, with unspecified organ involvement (HCC)   . Severe obesity (BMI >= 40) (HCC)   . Osteoarthritis of spine with radiculopathy, cervical region   . NSAID long-term use    I think taking a daily PPI makes sense for multiple reasons in this patient, one is her LPR and GERD, the other is long-term use of NSAID and ulcer prophylaxis.  esomeprazole 40 mg daily x1 year prescribed.  The dysphagia to pills was transient I am not sure why she had it but that would not require an investigation with imaging or endoscopy.  It might of been related to her cervical spine osteophytes.  Not seem to have much dry mouth so probably not related to that.  For weight loss we discussed eating whole foods, trying time restricted feeding plus minus intermittent fasting and low-carb diet.  Diet doctor website information provided to the patient.  5 to 10% weight loss would be medically helpful.  I appreciate the opportunity to care for this patient. CC: Patricia [name], Patricia G, MD  Subjective:   Chief Complaint: Pill dysphagia  HPI Patricia [name] returns for follow-up, she was having some transient difficulty swallowing pills in November.  No food dysphagia.  Using over-the-counter Nexium intermittently when she has heartburn symptoms.  Still has some hoarseness at times, she has a history of laryngeal pharyngeal reflux.  She says every time she has had problems in the past with her gastrointestinal system losing weight has helped so she cut out all desserts and I guess lost some weight and the transient problem swallowing pills went away.  Other issues that have come to light are she was recently diagnosed with Sjogren's but does not seem to have dry eyes or dry mouth to much degree.  She was having joint symptoms.  Due to follow back up  with a rheumatologist in [place].  Her hidradenitis suppurativa has flared and she is being connected with a dermatologist that knows how to treat that with Biologics.  She has had cervical spine radiculopathy problems and has osteophytes on some of her C-spine by x-ray.  She needs to take meloxicam daily for her osteoarthritis problems.  She continues to have obesity issues and talks about how she lost a lot of weight on a meal replacement plan but could never transition to a different diet.  She is interested in losing weight again.  We reviewed her C-spine films in the office today.     Allergies  Allergen Reactions  . Amphetamine-Dextroamphet Er     REACTION: not effective  . Bupropion Hcl     REACTION: irritability  . Celecoxib     REACTION: edema  . Septra [Sulfamethoxazole-Trimethoprim] Swelling  . Sertraline Hcl     REACTION: wt gain  . Tape Rash   Current Meds  Medication Sig  . calcium carbonate (TUMS) 500 MG chewable tablet Chew by mouth.  . cyclobenzaprine (FLEXERIL) 5 MG tablet Take 1 tablet (5 mg total) by mouth 3 (three) times daily as needed for muscle spasms.  . Ergocalciferol 2000 units TABS Take 1 tablet by mouth daily.  Marland Kitchen. esomeprazole (NEXIUM) 20 MG packet Take 20 mg by mouth as needed.  . fluticasone (FLONASE) 50 MCG/ACT nasal spray Place into both nostrils daily.   Past Medical History:  Diagnosis Date  . ADD (  attention deficit disorder)   . Allergic rhinitis   . Anxiety   . Chronic sinusitis   . Complication of anesthesia   . Degenerative disc disease   . Depression   . Gastritis   . GERD (gastroesophageal reflux disease)   . Hidradenitis suppurativa   . Hx MRSA infection    PT STATES PCP TREATED HER WITH MUPIRICON FOR WHAT HE THOUGHT WAS MRSA > 1 YEAR AGO   . Hypertension    H/O IN 2015-LOST 80 IBS AND WENT OFF MEDS  THEN DUE TO CONTROL  . Laryngopharyngeal reflux (LPR)   . MRSA nasal colonization 06/15/2012  . Obesity   . PONV (postoperative  nausea and vomiting)    AFTER BACK SURGERIES  . Rectal bleeding    Followed by GI   . Rosacea   . Sjogren's disease [facility](HCC)    Past Surgical History:  Procedure Laterality Date  . ANTERIOR CRUCIATE LIGAMENT REPAIR    . CESAREAN SECTION  Feb. 1995  . COLONOSCOPY  01/18/2012  . HYDRADENITIS EXCISION Left 02/04/2016   Procedure: EXCISION HIDRADENITIS AXILLA;  Surgeon: Leafy Roiego F Pabon, MD;  Location: [facility] ORS;  Service: General;  Laterality: Left;  . HYSTEROSCOPY  february 2014  . MICROLARYNGOSCOPY    . PILONIDAL CYST DRAINAGE    . SPINAL FUSION  03/30/2009   L4 L5 S1 Lumbar  . SPINAL FUSION  Feb. 1997   Lumbar fusion  . TONSILLECTOMY    . UPPER GASTROINTESTINAL ENDOSCOPY  01/26/2009   Gastritis, GERD  . UPPER GI ENDOSCOPY  2010   Social History   Social History Narrative   Daily caffeine    family history includes ADD / ADHD in her son; Aneurysm in her mother; Breast cancer (age of onset: 7955) in her maternal aunt; Cancer in her father; Colon cancer in her maternal uncle; Colon cancer (age of onset: 5270) in her maternal grandmother; Esophageal cancer (age of onset: 6257) in her father; Heart disease in her mother; Hyperlipidemia in her brother and mother; Hypertension in her brother, mother, and sister; Mental retardation in her father; Obesity in her sister; Stroke in her mother.   Review of Systems See HPI  Objective:   Physical Exam BP 132/77   Pulse 83   Ht 5\' 5"  (1.651 m)   Wt 256 lb (116.1 kg)   LMP 11/29/2011   SpO2 97%   BMI 42.60 kg/m  Obese and in no acute distress The eyes are anicteric There is some thinning of the hair on the scalp has an appropriate mood and affect Is alert and oriented x3

## 2018-10-26 ENCOUNTER — Other Ambulatory Visit: Payer: Self-pay | Admitting: Family Medicine

## 2018-10-26 DIAGNOSIS — G8929 Other chronic pain: Secondary | ICD-10-CM

## 2018-10-26 DIAGNOSIS — M2550 Pain in unspecified joint: Principal | ICD-10-CM

## 2018-10-29 DIAGNOSIS — M064 Inflammatory polyarthropathy: Secondary | ICD-10-CM | POA: Insufficient documentation

## 2018-10-31 NOTE — Telephone Encounter (Signed)
Last OV 08/15/2018   Last refilled 08/15/2018 disp 30 with 1 refill   Sent to PCP for approval

## 2018-10-31 NOTE — Telephone Encounter (Signed)
It appears the last time she was seen at GI that she reported not taking this medication.  Please see if she requested this refill or if it was from the pharmacy.  Thanks.

## 2018-11-14 ENCOUNTER — Telehealth: Payer: Self-pay | Admitting: *Deleted

## 2018-11-14 NOTE — Telephone Encounter (Signed)
Dr Sherie Don is not part of [facility] so the patient does not have to request a transfer of care. She should contact Dr Marlise Eves office to see if she can schedule an appointment.

## 2018-11-14 NOTE — Telephone Encounter (Signed)
Copied from CRM [id number]. Topic: Appointment Scheduling - Transfer of Care >> Nov 14, 2018 10:32 AM Jilda Rocheemaray, Melissa wrote: Pt is requesting to transfer FROM: Dr. Birdie SonsSonnenberg Pt is requesting to transfer TO: Dr. Sherie DonLada Reason for requested transfer: Dr. Birdie SonsSonnenberg is no longer in her new Insurance  Send CRM to patient's current PCP (transferring FROM).

## 2018-11-14 NOTE — Telephone Encounter (Signed)
Sent to PCP ?

## 2018-11-14 NOTE — Telephone Encounter (Signed)
Called and spoke with patient. Pt advised and voiced understanding.  

## 2018-11-14 NOTE — Telephone Encounter (Signed)
Pt stated that she does not need this refilled.

## 2018-11-22 NOTE — Unmapped (Signed)
ASSESSMENT/PLAN:     Hidradenitis Suppurativa (HS): Hurley II involving the R axilla, left inframammary skin, groin. Prior pilonidal cyst excised.   1. We discussed the chronic, relapsing nature of this skin disease, characterized by recurring inflamed painful nodules with abscess and sinus formation, and scarring  2. We explained to patient that early lesions can remit with medical treatment, but once sinus tracts are established treatment options are limited and surgery appropriate. She may be a candidate for small unroofing procedures in the future.   3. Start clindamycin 300 mg BID and rifampin 300mg  BID daily; discussed side effects such as diarrhea with patient. Advised to take with food   4. Given severity of disease and possible association with inflammatory arthritis, start Adalimumab as below pending labs  - adalimumab 80 mg/0.8 mL PnKt; 160 mg day 1, then 80 mg on day 15 then 40 mg on day 29  Dispense: 1 kit; Refill: 0  - ADALIMUMAB PEN CITRATE FREE 40 MG/0.4 ML; Maintenance dose: inject 40mg  SQ every week  Dispense: 4 pen; Refill: 2      High Risk Medication Use   - Labs recently performed per referral note, but labs not resulted.  - Provided patient fax # and advised this clinic to send the results.   - Discussed with patient that we need the results before she can start Humira, including quant gold and hepatitis panel     RTC: 2-3 months in [place] [facility]     SUBJECTIVE:    CC: Hidradenitis Suppurativa    Alexandra Peterson is a 58 y.o. female  who is seen for consultation today at the request of Dr. Sigurd Sos for evaluation of hidradenitis suppurativa.  She notes a 30+ year history of HS often with recurrent painful, draining nodules of the b/l axilla, groin, thighs, and left inframammary skin. There is residual scarring. Recently, she has had active areas of the right axilla, left inframammary skin, b/l thighs and left mons. She is currently not on any medical therapy. In past, she had a procedure under the left axilla and was treated with courses of antibiotics, hibiclens and topical clindamycin. She was seen by rheumatology who dx with OA, but suggested she may also have inflammation/arthritis related to underlying HS and discussed how Humira may be an appropriate treatment for both. Labs were obtained (orders seen in referral including quant gold and hepatitis panel, but lab results not available). At this time, she was given a short course of prednisone which did help her HS. She is a non-smoker. She also has a history of a pilonidal cyst which was excised many years ago without recurrence. Currently in menopause.     Prior treatments:  Topical: hibiclens, clindamycin   Systemic: unsure of which antibiotics   Past surgical procedures: excision or unroofing of the left axilla, pilonidal cyst excision years ago    Past laser procedures: none    PMH:   Osteoarthritis   Sjogren's     Family history  - son with pilonidal cyst, no HS     Social history   - lives in [place]     ROS: + joint pains. + fatigue. The balance of 10 systems is negative unless otherwise documented    OBJECTIVE:   Gen: Well-appearing patient, appropriate, interactive, in no acute distress  Skin: Examination of the scalp, face, neck, chest, back, abdomen, bilateral upper and lower extremities, hands, palms, soles, nails, buttocks, and external genitalia performed today and pertinent for:  location Abscess Inflamed nodule Non-inflamed nodule Draining sinus Non-draining Sinus Hurley % scar   R axilla  1 1  1      L axilla       2   R inframammary          L inframammary  1 1       Intermammary          Pubic  1   1     R inguinal          R thigh          L inguinal   2       L thigh   2       Scrotum/Vulva          Perianal          R buttock          L buttock          Other (list)                      AN count (total sum of abscess and inflammatory nodule): 3  Pilonidal sinus (Y/N, or previously treated)? excised previously  Approximate BSA involved by inflammatory lesions: 3  Intertriginous comedones: few  Diffuse comedones (trunk, face, etc): none  Acne scars: none  Cribriform scarring: No.  Intertrigionus epidermal inclusion cysts: none  Diffuse (trunk, feace, extremities) epidermal inclusion cysts: none

## 2018-11-25 ENCOUNTER — Encounter
Admit: 2018-11-25 | Discharge: 2018-11-26 | Payer: PRIVATE HEALTH INSURANCE | Attending: Dermatology | Primary: Dermatology

## 2018-11-25 DIAGNOSIS — L732 Hidradenitis suppurativa: Principal | ICD-10-CM

## 2018-11-25 MED ORDER — ADALIMUMAB 80 MG/0.8 ML SUBCUTANEOUS PEN KIT
PACK | 0 refills | 0 days | Status: CP
Start: 2018-11-25 — End: 2019-05-22
  Filled 2018-12-17: qty 3, 28d supply, fill #0

## 2018-11-25 MED ORDER — RIFAMPIN 300 MG CAPSULE
ORAL_CAPSULE | 2 refills | 0 days | Status: CP
Start: 2018-11-25 — End: 2019-03-26

## 2018-11-25 MED ORDER — CLINDAMYCIN HCL 300 MG CAPSULE
ORAL_CAPSULE | 2 refills | 0 days | Status: CP
Start: 2018-11-25 — End: 2019-03-26

## 2018-11-25 MED ORDER — ADALIMUMAB PEN CITRATE FREE 40 MG/0.4 ML
2 refills | 0 days | Status: CP
Start: 2018-11-25 — End: 2019-04-24

## 2018-11-25 NOTE — Unmapped (Signed)
She is scheduled with Janyth Contes on 03-17-2019

## 2018-11-25 NOTE — Unmapped (Signed)
Hello, It was nice seeing you today.     Let's try the following plan:   - I do not have access to your lab results. Please have your doctor fax the following results to Attn: Dr. Jettie Booze at [phone]:  - quant gold   - hepatitis labs   - CBC  - BMP or CMP    - Take clindamycin and rifampin each twice daily with food   - I will send in the Humira to our specialty pharmacy and check insurance     If you have any questions, please let us know.

## 2018-11-26 ENCOUNTER — Ambulatory Visit (INDEPENDENT_AMBULATORY_CARE_PROVIDER_SITE_OTHER): Payer: BLUE CROSS/BLUE SHIELD | Admitting: Family Medicine

## 2018-11-26 ENCOUNTER — Encounter: Payer: Self-pay | Admitting: Family Medicine

## 2018-11-26 DIAGNOSIS — M3500 Sicca syndrome, unspecified: Secondary | ICD-10-CM

## 2018-11-26 DIAGNOSIS — E2839 Other primary ovarian failure: Secondary | ICD-10-CM | POA: Diagnosis not present

## 2018-11-26 DIAGNOSIS — L732 Hidradenitis suppurativa: Secondary | ICD-10-CM | POA: Diagnosis not present

## 2018-11-26 DIAGNOSIS — I10 Essential (primary) hypertension: Secondary | ICD-10-CM

## 2018-11-26 MED ORDER — PANTOPRAZOLE SODIUM 40 MG PO TBEC: 40.0000 mg | DELAYED_RELEASE_TABLET | Freq: Every day | ORAL | 2 refills | Status: DC

## 2018-11-26 NOTE — Assessment & Plan Note (Signed)
Controlled today 

## 2018-11-26 NOTE — Assessment & Plan Note (Signed)
Managed by specialist 

## 2018-11-26 NOTE — Progress Notes (Signed)
BP 122/78   Pulse 93   Temp 98.3 F (36.8 C) (Oral)   Resp 12   Ht 5' 4"  (1.626 m)   Wt 255 lb 1.6 oz (115.7 kg)   LMP 11/29/2011   SpO2 95%   BMI 43.79 kg/m    Subjective:    Patient ID: Patricia [name], female    DOB: [birth year], 58 y.o.   MRN: [id number]  HPI: Patricia [name] is a 58 y.o. female  Chief Complaint  Patient presents with  . Establish Care    HPI  Patient is here to establish care  She saw derm at [facility] yesterday and will be seeing Dr. Titus Mould in May for hidradenitis suppurativa; they are looking into Humira; starting with an antibiotic; had surgery under the left axilla in 2017 or so, couldn't handle it any more, draining; removed  She is seeing rheumatologist for arthritis and Sjogren's; new diagnosis in November; had a physical at the end of August, labs were perfect, in the process of moving, then had one big day of moving, felt wiped out, felt bad, hurting, did the extra labs and found a marker for autoimmune disease, ANA and was seeing Dr. 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; shock to body but lost a lot of hair; had a Engineer, maintenance with that program; drank a lot of water  Now she is not drinking as much water, at least 80 ounces a day, between 60-80 ounces a day Lack of motivation Needs to start exercising; doing caregiver role, lives in apartment; cares for mother and brother, he does not get out, has his groceries, not daily, just POA and taking to appointments; mother is 1 years old and can walk with walker; takes her places, helping avoid falls She had two back surgeries, right knee surgery, balance is not good any more because she is out of condition; played softball  years ago; quit in 1994 because of messing up her leg Obesity runs in the family; sister is large too, cousin is big; runs in mother's side She took wellbutrin in 2007; really bad married, tried zoloft, did its job, but started gaining weight; then added wellbutrin, caused anxiety though, not true allergy  HTN; controlled today  Was taking Nexium, took it forever, then took pantoprazole; it's not working; had to switch GI doctors because of insurance; may have reacted to Prilosec; taking meloxicam  Depression screen [facility] 2/9 11/26/2018 04/29/2018 09/03/2015  Decreased Interest 0 0 3  Down, Depressed, Hopeless 0 0 3  PHQ - 2 Score 0 0 6  Altered sleeping 0 - 0  Tired, decreased energy 0 - 0  Change in appetite 0 - 3  Feeling bad or failure about yourself  0 - 3  Trouble concentrating 0 - 2  Moving slowly or fidgety/restless 0 - 0  Suicidal thoughts 0 - 0  PHQ-9 Score 0 - 14  Difficult doing work/chores Not difficult at all - -   Fall Risk  11/26/2018 04/29/2018  Falls in the past year? 0 No  Number falls in past yr: 0 -  Injury with Fall? 0 -    Relevant past medical, surgical, family  and social history reviewed Past Medical History:  Diagnosis Date  . ADD (attention deficit disorder)   . Allergic rhinitis   . Anxiety   . Chronic sinusitis   . Complication of anesthesia   . Degenerative disc disease   . Depression   . Gastritis   . GERD (gastroesophageal reflux disease)   . Hidradenitis suppurativa   . Hx MRSA infection    PT STATES PCP TREATED HER WITH MUPIRICON FOR WHAT HE THOUGHT WAS MRSA > 1 YEAR AGO   . Hypertension    H/O IN 2015-LOST 80 IBS AND WENT OFF MEDS  THEN DUE TO CONTROL  . Laryngopharyngeal reflux (LPR)   . MRSA nasal colonization 06/15/2012  . Obesity   . PONV (postoperative nausea and vomiting)    AFTER BACK SURGERIES  . Rectal bleeding    Followed by GI   . Rosacea   . Sjogren's disease [facility])    Past Surgical History:  Procedure Laterality Date  .  ANTERIOR CRUCIATE LIGAMENT REPAIR    . CESAREAN SECTION  Feb. 1995  . COLONOSCOPY  01/18/2012  . HYDRADENITIS EXCISION Left 02/04/2016   Procedure: EXCISION HIDRADENITIS AXILLA;  Surgeon: Jules Husbands, MD;  Location: [facility] ORS;  Service: General;  Laterality: Left;  . HYSTEROSCOPY  february 2014  . MICROLARYNGOSCOPY    . PILONIDAL CYST DRAINAGE    . SPINAL FUSION  03/30/2009   L4 L5 S1 Lumbar  . SPINAL FUSION  Feb. 1997   Lumbar fusion  . TONSILLECTOMY    . UPPER GASTROINTESTINAL ENDOSCOPY  01/26/2009   Gastritis, GERD  . UPPER GI ENDOSCOPY  2010   Family History  Problem Relation Age of Onset  . Esophageal cancer Father 3  . Cancer Father        esophageal CA,  tobacco user  . Colon cancer Maternal Grandmother 69  . Colon cancer Maternal Uncle   . Aneurysm Mother        2 abdominal aneurysms  . Hyperlipidemia Mother   . Hypertension Mother   . Stroke Mother        small stroke, x's  aneurysm   . Hypertension Sister   . Obesity Sister   . Thyroid disease Sister   . Hypertension Brother   . Hyperlipidemia Brother   . Atrial fibrillation Brother   . Kidney disease Brother   . Depression Brother   . Stroke Brother   . ADD / ADHD Son   . Breast cancer Maternal Aunt 21  . Diabetes Paternal Grandmother   . Cancer Paternal Grandfather        ?prostate   Social History   Tobacco Use  . Smoking status: Never Smoker  . Smokeless tobacco: Never Used  Substance Use Topics  . Alcohol use: Yes    Comment: RARE  . Drug use: No     [facility] Visit from 11/26/2018 in [facility]  AUDIT-C Score  1      Interim medical history since last visit reviewed. Allergies and medications reviewed  Review of Systems Per HPI unless specifically indicated above     Objective:    BP 122/78   Pulse 93   Temp 98.3 F (36.8 C) (Oral)   Resp 12   Ht 5' 4"  (1.626 m)   Wt 255 lb 1.6 oz (115.7 kg)   LMP 11/29/2011   SpO2 95%   BMI 43.79 kg/m   Wt Readings from  Last 3 Encounters:  11/26/18 255 lb  1.6 oz (115.7 kg)  10/17/18 256 lb (116.1 kg)  08/15/18 253 lb (114.8 kg)    Physical Exam Constitutional:      General: She is not in acute distress.    Appearance: She is well-developed. She is not diaphoretic.  HENT:     Head: Normocephalic and atraumatic.  Eyes:     General: No scleral icterus. Neck:     Thyroid: No thyromegaly.  Cardiovascular:     Rate and Rhythm: Normal rate and regular rhythm.     Heart sounds: Normal heart sounds. No murmur.  Pulmonary:     Effort: Pulmonary effort is normal. No respiratory distress.     Breath sounds: Normal breath sounds. No wheezing.  Abdominal:     General: There is no distension.  Skin:    General: Skin is warm and dry.     Coloration: Skin is not pale.  Neurological:     Mental Status: She is alert.  Psychiatric:        Behavior: Behavior normal.        Thought Content: Thought content normal.        Judgment: Judgment normal.     Results for orders placed or performed in visit on 08/15/18  CBC  Result Value Ref Range   WBC 8.4 4.0 - 10.5 K/uL   RBC 4.31 3.87 - 5.11 Mil/uL   Platelets 233.0 150.0 - 400.0 K/uL   Hemoglobin 13.6 12.0 - 15.0 g/dL   HCT 39.5 36.0 - 46.0 %   MCV 91.8 78.0 - 100.0 fl   MCHC 34.5 30.0 - 36.0 g/dL   RDW 12.5 11.5 - 15.5 %  Comp Met (CMET)  Result Value Ref Range   Sodium 142 135 - 145 mEq/L   Potassium 4.0 3.5 - 5.1 mEq/L   Chloride 102 96 - 112 mEq/L   CO2 30 19 - 32 mEq/L   Glucose, Bld 114 (H) 70 - 99 mg/dL   BUN 17 6 - 23 mg/dL   Creatinine, Ser 0.63 0.40 - 1.20 mg/dL   Total Bilirubin 0.6 0.2 - 1.2 mg/dL   Alkaline Phosphatase 71 39 - 117 U/L   AST 26 0 - 37 U/L   ALT 27 0 - 35 U/L   Total Protein 7.7 6.0 - 8.3 g/dL   Albumin 4.1 3.5 - 5.2 g/dL   Calcium 10.0 8.4 - 10.5 mg/dL   GFR 103.22 >60.00 mL/min  Vitamin D (25 hydroxy)  Result Value Ref Range   VITD 21.08 (L) 30.00 - 100.00 ng/mL  B12 and Folate Panel  Result Value Ref Range    Vitamin B-12 538 211 - 911 pg/mL   Folate >23.9 >5.9 ng/mL  Antinuclear Antib (ANA)  Result Value Ref Range   Anti Nuclear Antibody(ANA) POSITIVE (A) NEGATIVE  Rheumatoid Factor  Result Value Ref Range   Rhuematoid fact SerPl-aCnc <14 <14 IU/mL  Thyroid Panel With TSH  Result Value Ref Range   T3 Uptake 23 22 - 35 %   T4, Total 9.4 5.1 - 11.9 mcg/dL   Free Thyroxine Index 2.2 1.4 - 3.8   TSH 1.91 0.40 - 4.50 mIU/L  Sedimentation rate  Result Value Ref Range   Sed Rate 40 (H) 0 - 30 mm/hr  C-reactive protein  Result Value Ref Range   CRP 2.9 0.5 - 20.0 mg/dL  Anti-nuclear ab-titer (ANA titer)  Result Value Ref Range   ANA Titer 1 1:80 (H) titer   ANA Pattern 1 Nuclear, Speckled (A)  Assessment & Plan:   Problem List Items Addressed This Visit      Cardiovascular and Mediastinum   Essential hypertension    Controlled today        Digestive   Sjogren's syndrome without extraglandular involvement ([place])    Managed by specialist        Musculoskeletal and Integument   Hidradenitis suppurativa    Managed by specialist        Other   Morbid obesity ([place]) - Primary    Not interested in bariatric surgery; wants to embark on weight loss program      Relevant Orders   Amb ref to Medical Nutrition Therapy-MNT    Other Visit Diagnoses    Estrogen deficiency       Relevant Orders   DG Bone Density       Follow up plan: Return in about 4 weeks (around 12/24/2018) for follow-up visit with Dr. Sanda Klein.  An after-visit summary was printed and given to the patient at [facility].  Please see the patient instructions which may contain other information and recommendations beyond what is mentioned above in the assessment and plan.  Meds ordered this encounter  Medications  . pantoprazole (PROTONIX) 40 MG tablet    Sig: Take 1 tablet (40 mg total) by mouth daily.    Dispense:  30 tablet    Refill:  2    Orders Placed This Encounter  Procedures  . DG Bone Density    . Amb ref to Medical Nutrition Therapy-MNT

## 2018-11-26 NOTE — Assessment & Plan Note (Signed)
Not interested in bariatric surgery; wants to embark on weight loss program

## 2018-11-26 NOTE — Patient Instructions (Addendum)
Caution: prolonged use of proton pump inhibitors like omeprazole (Prilosec), pantoprazole (Protonix), esomeprazole (Nexium), and others like Dexilant and Aciphex may increase your risk of pneumonia, Clostridium difficile colitis, osteoporosis, anemia and other health complications Try to limit or avoid triggers like coffee, caffeinated beverages, onions, chocolate, spicy foods, peppermint, acidic foods like pizza, spaghetti sauce, and orange juice Lose weight if you are overweight or obese Try elevating the head of your bed by placing a small wedge between your mattress and box springs to keep acid in the stomach at night instead of coming up into your esophagus  Consider reading about Saxenda and Contrave and Topamax (that would be off-label) Think about when you eat, why you eat, and what you eat Do try to move more, and build up gradually  Check out the information at familydoctor.org entitled "Nutrition for Weight Loss: What You Need to Know about Fad Diets" Try to lose between 1-2 pounds per week by taking in fewer calories and burning off more calories You can succeed by limiting portions, limiting foods dense in calories and fat, becoming more active, and drinking 8 glasses of water a day (64 ounces) Don't skip meals, especially breakfast, as skipping meals may alter your metabolism Do not use over-the-counter weight loss pills or gimmicks that claim rapid weight loss A healthy BMI (or body mass index) is between 18.5 and 24.9 You can calculate your ideal BMI at the NIH website JobEconomics.huhttp://www.nhlbi.nih.gov/health/educational/lose_wt/BMI/bmicalc.htm   Obesity, Adult Obesity is the condition of having too much total body fat. Being overweight or obese means that your weight is greater than what is considered healthy for your body size. Obesity is determined by a measurement called BMI. BMI is an estimate of body fat and is calculated from height and weight. For adults, a BMI of 30 or higher is  considered obese. Obesity can eventually lead to other health concerns and major illnesses, including:  Stroke.  Coronary artery disease (CAD).  Type 2 diabetes.  Some types of cancer, including cancers of the colon, breast, uterus, and gallbladder.  Osteoarthritis.  High blood pressure (hypertension).  High cholesterol.  Sleep apnea.  Gallbladder stones.  Infertility problems. What are the causes? The main cause of obesity is taking in (consuming) more calories than your body uses for energy. Other factors that contribute to this condition may include:  Being born with genes that make you more likely to become obese.  Having a medical condition that causes obesity. These conditions include: ? Hypothyroidism. ? Polycystic ovarian syndrome (PCOS). ? Binge-eating disorder. ? Cushing syndrome.  Taking certain medicines, such as steroids, antidepressants, and seizure medicines.  Not being physically active (sedentary lifestyle).  Living where there are limited places to exercise safely or buy healthy foods.  Not getting enough sleep. What increases the risk? The following factors may increase your risk of this condition:  Having a family history of obesity.  Being a woman of African-American descent.  Being a man of Hispanic descent. What are the signs or symptoms? Having excessive body fat is the main symptom of this condition. How is this diagnosed? This condition may be diagnosed based on:  Your symptoms.  Your medical history.  A physical exam. Your health care provider may measure: ? Your BMI. If you are an adult with a BMI between 25 and less than 30, you are considered overweight. If you are an adult with a BMI of 30 or higher, you are considered obese. ? The distances around your hips and your  waist (circumferences). These may be compared to each other to help diagnose your condition. ? Your skinfold thickness. Your health care provider may gently pinch  a fold of your skin and measure it. How is this treated? Treatment for this condition often includes changing your lifestyle. Treatment may include some or all of the following:  Dietary changes. Work with your health care provider and a dietitian to set a weight-loss goal that is healthy and reasonable for you. Dietary changes may include eating: ? Smaller portions. A portion size is the amount of a particular food that is healthy for you to eat at one time. This varies from person to person. ? Low-calorie or low-fat options. ? More whole grains, fruits, and vegetables.  Regular physical activity. This may include aerobic activity (cardio) and strength training.  Medicine to help you lose weight. Your health care provider may prescribe medicine if you are unable to lose 1 pound a week after 6 weeks of eating more healthily and doing more physical activity.  Surgery. Surgical options may include gastric banding and gastric bypass. Surgery may be done if: ? Other treatments have not helped to improve your condition. ? You have a BMI of 40 or higher. ? You have life-threatening health problems related to obesity. Follow these instructions at home:  Eating and drinking   Follow recommendations from your health care provider about what you eat and drink. Your health care provider may advise you to: ? Limit fast foods, sweets, and processed snack foods. ? Choose low-fat options, such as low-fat milk instead of whole milk. ? Eat 5 or more servings of fruits or vegetables every day. ? Eat at home more often. This gives you more control over what you eat. ? Choose healthy foods when you eat out. ? Learn what a healthy portion size is. ? Keep low-fat snacks on hand. ? Avoid sugary drinks, such as soda, fruit juice, iced tea sweetened with sugar, and flavored milk. ? Eat a healthy breakfast.  Drink enough water to keep your urine clear or pale yellow.  Do not go without eating for long  periods of time (do not fast) or follow a fad diet. Fasting and fad diets can be unhealthy and even dangerous. Physical Activity  Exercise regularly, as told by your health care provider. Ask your health care provider what types of exercise are safe for you and how often you should exercise.  Warm up and stretch before being active.  Cool down and stretch after being active.  Rest between periods of activity. Lifestyle  Limit the time that you spend in front of your TV, computer, or video game system.  Find ways to reward yourself that do not involve food.  Limit alcohol intake to no more than 1 drink a day for nonpregnant women and 2 drinks a day for men. One drink equals 12 oz of beer, 5 oz of wine, or 1 oz of hard liquor. General instructions  Keep a weight loss journal to keep track of the food you eat and how much you exercise you get.  Take over-the-counter and prescription medicines only as told by your health care provider.  Take vitamins and supplements only as told by your health care provider.  Consider joining a support group. Your health care provider may be able to recommend a support group.  Keep all follow-up visits as told by your health care provider. This is important. Contact a health care provider if:  You are unable to  meet your weight loss goal after 6 weeks of dietary and lifestyle changes. This information is not intended to replace advice given to you by your health care provider. Make sure you discuss any questions you have with your health care provider. Document Released: 11/23/2004 Document Revised: 03/20/2016 Document Reviewed: 08/04/2015 Elsevier Interactive Patient Education  2019 Elsevier Inc.  Preventing Unhealthy Kinder Morgan EnergyWeight Gain, Adult Staying at a healthy weight is important to your overall health. When fat builds up in your body, you may become overweight or obese. Being overweight or obese increases your risk of developing certain health  problems, such as heart disease, diabetes, sleeping problems, joint problems, and some types of cancer. Unhealthy weight gain is often the result of making unhealthy food choices or not getting enough exercise. You can make changes to your lifestyle to prevent obesity and stay as healthy as possible. What nutrition changes can be made?   Eat only as much as your body needs. To do this: ? Pay attention to signs that you are hungry or full. Stop eating as soon as you feel full. ? If you feel hungry, try drinking water first before eating. Drink enough water so your urine is clear or pale yellow. ? Eat smaller portions. Pay attention to portion sizes when eating out. ? Look at serving sizes on food labels. Most foods contain more than one serving per container. ? Eat the recommended number of calories for your gender and activity level. For most active people, a daily total of 2,000 calories is appropriate. If you are trying to lose weight or are not very active, you may need to eat fewer calories. Talk with your health care provider or a diet and nutrition specialist (dietitian) about how many calories you need each day.  Choose healthy foods, such as: ? Fruits and vegetables. At each meal, try to fill at least half of your plate with fruits and vegetables. ? Whole grains, such as whole-wheat bread, brown rice, and quinoa. ? Lean meats, such as chicken or fish. ? Other healthy proteins, such as beans, eggs, or tofu. ? Healthy fats, such as nuts, seeds, fatty fish, and olive oil. ? Low-fat or fat-free dairy products.  Check food labels, and avoid food and drinks that: ? Are high in calories. ? Have added sugar. ? Are high in sodium. ? Have saturated fats or trans fats.  Cook foods in healthier ways, such as by baking, broiling, or grilling.  Make a meal plan for the week, and shop with a grocery list to help you stay on track with your purchases. Try to avoid going to the grocery store when  you are hungry.  When grocery shopping, try to shop around the outside of the store first, where the fresh foods are. Doing this helps you to avoid prepackaged foods, which can be high in sugar, salt (sodium), and fat. What lifestyle changes can be made?   Exercise for 30 or more minutes on 5 or more days each week. Exercising may include brisk walking, yard work, biking, running, swimming, and team sports like basketball and soccer. Ask your health care provider which exercises are safe for you.  Do muscle-strengthening activities, such as lifting weights or using resistance bands, on 2 or more days a week.  Do not use any products that contain nicotine or tobacco, such as cigarettes and e-cigarettes. If you need help quitting, ask your health care provider.  Limit alcohol intake to no more than 1 drink a day for  nonpregnant women and 2 drinks a day for men. One drink equals 12 oz of beer, 5 oz of wine, or 1 oz of hard liquor.  Try to get 7-9 hours of sleep each night. What other changes can be made?  Keep a food and activity journal to keep track of: ? What you ate and how many calories you had. Remember to count the calories in sauces, dressings, and side dishes. ? Whether you were active, and what exercises you did. ? Your calorie, weight, and activity goals.  Check your weight regularly. Track any changes. If you notice you have gained weight, make changes to your diet or activity routine.  Avoid taking weight-loss medicines or supplements. Talk to your health care provider before starting any new medicine or supplement.  Talk to your health care provider before trying any new diet or exercise plan. Why are these changes important? Eating healthy, staying active, and having healthy habits can help you to prevent obesity. Those changes also:  Help you manage stress and emotions.  Help you connect with friends and family.  Improve your self-esteem.  Improve your  sleep.  Prevent long-term health problems. What can happen if changes are not made? Being obese or overweight can cause you to develop joint or bone problems, which can make it hard for you to stay active or do activities you enjoy. Being obese or overweight also puts stress on your heart and lungs and can lead to health problems like diabetes, heart disease, and some cancers. Where to find more information Talk with your health care provider or a dietitian about healthy eating and healthy lifestyle choices. You may also find information from:  U.S. Department of Agriculture, MyPlate: https://ball-collins.biz/  American Heart Association: www.heart.org  Centers for Disease Control and Prevention: FootballExhibition.com.br Summary  Staying at a healthy weight is important to your overall health. It helps you to prevent certain diseases and health problems, such as heart disease, diabetes, joint problems, sleep disorders, and some types of cancer.  Being obese or overweight can cause you to develop joint or bone problems, which can make it hard for you to stay active or do activities you enjoy.  You can prevent unhealthy weight gain by eating a healthy diet, exercising regularly, not smoking, limiting alcohol, and getting enough sleep.  Talk with your health care provider or a dietitian for guidance about healthy eating and healthy lifestyle choices. This information is not intended to replace advice given to you by your health care provider. Make sure you discuss any questions you have with your health care provider. Document Released: 10/17/2016 Document Revised: 07/27/2017 Document Reviewed: 11/22/2016 Elsevier Interactive Patient Education  2019 [employer].

## 2018-11-28 NOTE — Unmapped (Signed)
[facility] Medication Referral: Financial Assistance Approved      Medication (Brand/Generic): Humira    Final Test Claim completed with resulted information below:    Patient ABLE to fill at [facility]  Insurance Company:  [employer] [place]  Anticipated Copay: $5  Is anticipated copay with a copay card or grant? Yes    Does patient's insurance plan only allow a 15 day supply for the first 6 fills in the Split Fill Program? No  If yes, inform patient they can request to dis-enroll from the [employer] by calling the patient help desk at N/A.      If the copay is under the $25 defined limit, per policy there will be no further investigation of need for financial assistance at this time unless patient requests. This referral has been communicated to the provider and handed off to the [facility] team for further processing and filling of prescribed medication.   ______________________________________________________________________  Please utilize this referral for viewing purposes as it will serve as the central location for all relevant documentation and updates.

## 2018-11-29 NOTE — Unmapped (Signed)
Call from patient.  Needs clarification from doctor as to how to take rifampin.  Was told by Dr. Jettie Booze to take with food and the pharmacy bottle says to take on empty stomach.

## 2018-11-29 NOTE — Unmapped (Signed)
Patient called stating her Humira is approved and requests a Rx be sent to [facility].

## 2018-12-09 NOTE — Unmapped (Signed)
TB and Hep B negative per staff message from Dr. Earley Abide.  Patient working with Air cabin crew.    [facility]   Patient Onboarding/Medication Counseling    Ms.Down is a 58 y.o. female with Hidradenitis suppurativa who I am counseling today on initiation of therapy.    Medication: Humira     Verified patient's date of birth / HIPAA.      Education Provided: ?    Dose/Administration discussed: Inject 2 pens (160mg ) under the skin on day 1, then 1(80 mg) pen on day 15, then maintenance dose of 1 pen (40mg ) once weekly. This medication should be taken  without regard to food.  Stressed the importance of taking medication as prescribed and to contact provider if that changes at any time.  Discussed missed dose instructions.    Administration:  1. Take pen out of refrigerator and let reach room temp for 15-30 min. (do not manually warm under water, microwave, or in direct sunlight)  2. Make sure liquid is clear to colorless and does not contain particles/flakes  3. Wash hands and wipe injection site (either abdomen at least 2 inches away from naval or thigh) with alcohol pad.  Let dry for few seconds  4. Take off caps and with plum colored end pointing up.  5. Pinch area of skin to be injected and place the white end of the pen straight and flat against the area (90 degree angle).    6. Press plum colored activator button and you will hear a click, Hold for about 10 seconds or until yellow marker appears in window.  7. Remove pen from skin and dispose of in sharps container.    Patient confirmed she will have further detailed counseling with the [facility] nurse ambassador.      Storage requirements: this medicine should be stored in the refrigerator.     Side effects / precautions discussed: Discussed common side effects, including injection site reaction, symptoms of common cold, headache. Stomach pain or upset stomach. If patient experiences allergic reaction, signs of an infection (fever, chills, etc), signs of an UTI, signs of high blood pressure, a skin lump or growth, or re scaly patches or pus filled bumps, they need to call the doctor.  Patient will receive a drug information handout with shipment.    Handling precautions / disposal reviewed:  Patient will dispose of needles in a sharps container or empty laundry detergent bottle.     Drug Interactions: other medications reviewed and up to date in Epic.  No drug interactions identified.    Comorbidities/Allergies: reviewed and up to date in Epic.    Verified therapy is appropriate and should continue      Delivery Information    Medication Assistance provided: Copay Assistance copay card    Anticipated copay of $5 reviewed with patient. Verified delivery address in Epic.    Scheduled delivery date: 12/17/18  Medication will be delivered via Same Day Courier to the home address in [place].  This shipment will not require a signature.      **Please leave delivery comments: If no one at home, please deliver package to apartment office**    Explained the services we provide at [facility] and that each month we would call to set up refills.  Stressed importance of returning phone calls so that we could ensure they receive their medications in time each month.  Informed patient that we should be setting up refills  7-10 days prior to when they will run out of medication.  Informed patient that welcome packet will be sent.      Patient verbalized understanding of the above information as well as how to contact the pharmacy at [phone] option [phone] with any questions/concerns.  The pharmacy is open Monday through Friday 8:30am-4:30pm.  A pharmacist is available 24/7 via pager to answer any clinical questions they may have.        Patient Specific Needs      ? Patient has no physical, cognitive, or cultural barriers.    ? Patient prefers to have medications discussed with  Patient     ? Patient is able to read and understand education materials at a high school level or above.    ? Patient's primary language is  English           Chief of Staff  [facility] Specialty Pharmacist

## 2018-12-16 ENCOUNTER — Telehealth: Payer: Self-pay

## 2018-12-16 NOTE — Unmapped (Signed)
Received the following labs from patient's PCP, OK to obtain Humira as planned:     Hep B core Ab was pos back in 12/19, but was repeated and is neg now. ??  Hep B surface Ab negative.   Heb surface Ag neg.   ??HCV Ab neg. ??  HBV PCR neg.   ??CRB 26.   Quant gold neg.   ??CCP, anti-dsDNA, RF neg. ??   G6PD normal. ??  UA, CMET, CBC with diff normal.   ??Anti-SSA elevated at 6.2 (ULN 0.9).     This will be scanned into patient's chart.

## 2018-12-16 NOTE — Telephone Encounter (Signed)
Copied from CRM [id number]. Topic: General - Other >> Dec 12, 2018 10:17 AM Baldo Daub L wrote: Reason for CRM:   Pt states that she has been put on Humira and pt is working with Humira to get the medication.  Pt states that she was supposed to get training on how to use the medication (they can send someone to her house), but she wants to know if this training is something she can get through the doctor. Pt can be reached at [phone]

## 2018-12-17 MED FILL — HUMIRA PEN CITRATE FREE STARTER PACK FOR CROHN'S/UC/HS 3 X 80 MG/0.8 ML: 28 days supply | Qty: 3 | Fill #0 | Status: AC

## 2018-12-23 NOTE — Unmapped (Signed)
2/24 - Alexandra Peterson called to inform us that she started he Humira on 2/20 and is due for her next dose out of her titration pack on 3/5.

## 2018-12-24 ENCOUNTER — Encounter: Payer: Self-pay | Admitting: Family Medicine

## 2018-12-24 ENCOUNTER — Ambulatory Visit (INDEPENDENT_AMBULATORY_CARE_PROVIDER_SITE_OTHER): Payer: BLUE CROSS/BLUE SHIELD | Admitting: Family Medicine

## 2018-12-24 ENCOUNTER — Telehealth: Payer: Self-pay

## 2018-12-24 VITALS — BP 120/82 | HR 86 | Temp 98.5°F | Resp 16 | Ht 64.0 in | Wt 256.5 lb

## 2018-12-24 DIAGNOSIS — I10 Essential (primary) hypertension: Secondary | ICD-10-CM | POA: Diagnosis not present

## 2018-12-24 DIAGNOSIS — R000 Tachycardia, unspecified: Secondary | ICD-10-CM

## 2018-12-24 DIAGNOSIS — Z9189 Other specified personal risk factors, not elsewhere classified: Secondary | ICD-10-CM

## 2018-12-24 DIAGNOSIS — M3500 Sicca syndrome, unspecified: Secondary | ICD-10-CM

## 2018-12-24 NOTE — Telephone Encounter (Signed)
Successful outreach with Ms. Mccuiston. HIPAA identifiers verified.  Patient educated on purpose, proper use, injection technique, and potential adverse effects of Humira.   Patient took first dose of Humira last week using companies RN program.   Provided my contact information should she have any pharmacy needs in the future.   Karalee Height, PharmD Clinical Pharmacist [facility] [phone]

## 2018-12-24 NOTE — Telephone Encounter (Signed)
Copied from CRM [id number]. Topic: General - Other >> Dec 24, 2018 12:00 PM Jilda Roche wrote: Reason for CRM: Referral that was given to patient for Cardiologist is not in Network she would like Dr. Marlise Eves recommendation for one in this area. Please send her a MyChart message   Best call back is [phone]

## 2018-12-24 NOTE — Progress Notes (Signed)
BP 120/82   Pulse 86   Temp 98.5 F (36.9 C)   Resp 16   Ht '5\' 4"'$  (1.626 m)   Wt 256 lb 8 oz (116.3 kg)   LMP 11/29/2011   SpO2 97%   BMI 44.03 kg/m    Subjective:    Patient ID: Patricia [name], female    DOB: [birth year], 58 y.o.   MRN: [id number]  HPI: Patricia [name] is a 58 y.o. female  Chief Complaint  Patient presents with  . Follow-up    HPI She is here for follow-up; last visit was Nov 26, 2018  Started Humira, bruising at injection site, otherwise tolerating well; under the care of [facility] dermatology; she is aware of infection risk, no live vaccines Also has Sjogrens and osteoarthritis, seeing rheumatologist  Morbid obesity; not doing anything right now; she did get a call from the nutritionist and has an appointment coming up; she is hoping to help her change something; she is interviewing for a job; she is hoping having a job will put her on regular schedule; will give her some structure; we talked about packing lunches when she did the meal supplements; she may go out for lunch occasionally with mother and brother; she will have a plan on those days; she is looking at the pattern of sugar addiction; not feeling sated, learning about that; ted talk about sugar addiction, low motivation  Hypertension; controlled today; will work on a plan for sodium  She feels at risk for sleep apnea; snoring; no documented sleep apnea; did wake herself up in the middle of the night laying flat in the bed last year; sleeping recliner; she does not want a sleep study (can cause heart failure and sudden death I immediately explained, but she still declines referral to be evaluated for OSA)  No heart disease in her family, but has weight issues; she doesn't get chest pain; this area may feel like it's carrying a load; not anything like pressure on the chest; no arm symptoms; brother has afib; not overweight; last EKG 2017; mother had AAA; cousin had aneurysm; both were smokers  Depression  screen [place] 2/9 12/31/2018 12/24/2018 11/26/2018 04/29/2018 09/03/2015  Decreased Interest 0 0 0 0 3  Down, Depressed, Hopeless 0 0 0 0 3  PHQ - 2 Score 0 0 0 0 6  Altered sleeping - 0 0 - 0  Tired, decreased energy - 0 0 - 0  Change in appetite - 0 0 - 3  Feeling bad or failure about yourself  - 0 0 - 3  Trouble concentrating - 0 0 - 2  Moving slowly or fidgety/restless - 0 0 - 0  Suicidal thoughts - 0 0 - 0  PHQ-9 Score - 0 0 - 14  Difficult doing work/chores - Not difficult at all Not difficult at all - -   Fall Risk  12/31/2018 12/24/2018 11/26/2018 04/29/2018  Falls in the past year? 0 0 0 No  Number falls in past yr: - 0 0 -  Injury with Fall? - 0 0 -  Follow up - Falls evaluation completed - -    Relevant past medical, surgical, family and social history reviewed Past Medical History:  Diagnosis Date  . ADD (attention deficit disorder)   . Allergic rhinitis   . Anxiety   . Chronic sinusitis   . Complication of anesthesia   . Degenerative disc disease   . Depression   . Gastritis   . GERD (gastroesophageal  reflux disease)   . Hidradenitis suppurativa   . Hx MRSA infection    PT STATES PCP TREATED HER WITH MUPIRICON FOR WHAT HE THOUGHT WAS MRSA > 1 YEAR AGO   . Hypertension    H/O IN 2015-LOST 80 IBS AND WENT OFF MEDS  THEN DUE TO CONTROL  . Laryngopharyngeal reflux (LPR)   . MRSA nasal colonization 06/15/2012  . Obesity   . PONV (postoperative nausea and vomiting)    AFTER BACK SURGERIES  . Rectal bleeding    Followed by GI   . Rosacea   . Sjogren's disease [facility])    Past Surgical History:  Procedure Laterality Date  . ANTERIOR CRUCIATE LIGAMENT REPAIR    . CESAREAN SECTION  Feb. 1995  . COLONOSCOPY  01/18/2012  . HYDRADENITIS EXCISION Left 02/04/2016   Procedure: EXCISION HIDRADENITIS AXILLA;  Surgeon: Jules Husbands, MD;  Location: [facility] ORS;  Service: General;  Laterality: Left;  . HYSTEROSCOPY  february 2014  . MICROLARYNGOSCOPY    . PILONIDAL CYST DRAINAGE    . SPINAL  FUSION  03/30/2009   L4 L5 S1 Lumbar  . SPINAL FUSION  Feb. 1997   Lumbar fusion  . TONSILLECTOMY    . UPPER GASTROINTESTINAL ENDOSCOPY  01/26/2009   Gastritis, GERD  . UPPER GI ENDOSCOPY  2010   Family History  Problem Relation Age of Onset  . Esophageal cancer Father 58  . Cancer Father        esophageal CA,  tobacco user  . Colon cancer Maternal Grandmother 10  . Colon cancer Maternal Uncle   . Aneurysm Mother        2 abdominal aneurysms  . Hyperlipidemia Mother   . Hypertension Mother   . Stroke Mother        small stroke, x's  aneurysm   . Hypertension Sister   . Obesity Sister   . Thyroid disease Sister   . Hypertension Brother   . Hyperlipidemia Brother   . Atrial fibrillation Brother   . Kidney disease Brother   . Depression Brother   . Stroke Brother   . ADD / ADHD Son   . Breast cancer Maternal Aunt 89  . Diabetes Paternal Grandmother   . Cancer Paternal Grandfather        ?prostate   Social History   Tobacco Use  . Smoking status: Never Smoker  . Smokeless tobacco: Never Used  Substance Use Topics  . Alcohol use: Yes    Comment: RARE  . Drug use: No     Office Visit from 12/24/2018 in [facility]  AUDIT-C Score  0      Interim medical history since last visit reviewed. Allergies and medications reviewed  Review of Systems Per HPI unless specifically indicated above     Objective:    BP 120/82   Pulse 86   Temp 98.5 F (36.9 C)   Resp 16   Ht '5\' 4"'$  (1.626 m)   Wt 256 lb 8 oz (116.3 kg)   LMP 11/29/2011   SpO2 97%   BMI 44.03 kg/m   Wt Readings from Last 3 Encounters:  12/31/18 254 lb (115.2 kg)  12/24/18 256 lb 8 oz (116.3 kg)  11/26/18 255 lb 1.6 oz (115.7 kg)    Physical Exam Constitutional:      General: She is not in acute distress.    Appearance: She is well-developed. She is obese. She is not diaphoretic.  HENT:     Head:  Normocephalic and atraumatic.  Eyes:     General: No scleral icterus. Neck:      Thyroid: No thyromegaly.     Vascular: No carotid bruit.  Cardiovascular:     Rate and Rhythm: Normal rate and regular rhythm.     Heart sounds: Normal heart sounds. No murmur.  Pulmonary:     Effort: Pulmonary effort is normal. No respiratory distress.     Breath sounds: Normal breath sounds. No wheezing.  Abdominal:     General: Bowel sounds are normal. There is no distension or abdominal bruit.     Palpations: Abdomen is soft. There is no pulsatile mass.  Skin:    General: Skin is warm and dry.     Coloration: Skin is not pale.  Neurological:     Mental Status: She is alert.  Psychiatric:        Behavior: Behavior normal.        Thought Content: Thought content normal.        Judgment: Judgment normal.     Results for orders placed or performed in visit on 08/15/18  CBC  Result Value Ref Range   WBC 8.4 4.0 - 10.5 K/uL   RBC 4.31 3.87 - 5.11 Mil/uL   Platelets 233.0 150.0 - 400.0 K/uL   Hemoglobin 13.6 12.0 - 15.0 g/dL   HCT 39.5 36.0 - 46.0 %   MCV 91.8 78.0 - 100.0 fl   MCHC 34.5 30.0 - 36.0 g/dL   RDW 12.5 11.5 - 15.5 %  Comp Met (CMET)  Result Value Ref Range   Sodium 142 135 - 145 mEq/L   Potassium 4.0 3.5 - 5.1 mEq/L   Chloride 102 96 - 112 mEq/L   CO2 30 19 - 32 mEq/L   Glucose, Bld 114 (H) 70 - 99 mg/dL   BUN 17 6 - 23 mg/dL   Creatinine, Ser 0.63 0.40 - 1.20 mg/dL   Total Bilirubin 0.6 0.2 - 1.2 mg/dL   Alkaline Phosphatase 71 39 - 117 U/L   AST 26 0 - 37 U/L   ALT 27 0 - 35 U/L   Total Protein 7.7 6.0 - 8.3 g/dL   Albumin 4.1 3.5 - 5.2 g/dL   Calcium 10.0 8.4 - 10.5 mg/dL   GFR 103.22 >60.00 mL/min  Vitamin D (25 hydroxy)  Result Value Ref Range   VITD 21.08 (L) 30.00 - 100.00 ng/mL  B12 and Folate Panel  Result Value Ref Range   Vitamin B-12 538 211 - 911 pg/mL   Folate >23.9 >5.9 ng/mL  Antinuclear Antib (ANA)  Result Value Ref Range   Anti Nuclear Antibody(ANA) POSITIVE (A) NEGATIVE  Rheumatoid Factor  Result Value Ref Range   Rhuematoid  fact SerPl-aCnc <14 <14 IU/mL  Thyroid Panel With TSH  Result Value Ref Range   T3 Uptake 23 22 - 35 %   T4, Total 9.4 5.1 - 11.9 mcg/dL   Free Thyroxine Index 2.2 1.4 - 3.8   TSH 1.91 0.40 - 4.50 mIU/L  Sedimentation rate  Result Value Ref Range   Sed Rate 40 (H) 0 - 30 mm/hr  C-reactive protein  Result Value Ref Range   CRP 2.9 0.5 - 20.0 mg/dL  Anti-nuclear ab-titer (ANA titer)  Result Value Ref Range   ANA Titer 1 1:80 (H) titer   ANA Pattern 1 Nuclear, Speckled (A)       Assessment & Plan:   Problem List Items Addressed This Visit      Cardiovascular  and Mediastinum   Essential hypertension - Primary (Chronic)    Controlled today; continue to try to lose weight, DASH guidelines      Relevant Orders   EKG 12-Lead (Completed)   Ambulatory referral to Cardiology     Digestive   Sjogren's syndrome without extraglandular involvement ([place])    Managed by rheum and derm        Other   Morbid obesity ([place]) (Chronic)    She wants to lose weight; with fam hx of heart issues, she'll see cardiologist to evaluate prior to really starting weight loss, exercise program; discussed motivational factors, seeing nutritionist; trouble shooting for healthier eating      At risk for obstructive sleep apnea    Patient declines referral to be evaluated for sleep apnea; explained OSA can cause sudden death, heart failure, etc.; she still declines       Other Visit Diagnoses    Tachycardia       Relevant Orders   EKG 12-Lead (Completed)   Ambulatory referral to Cardiology       Follow up plan: Return in about 3 months (around 03/24/2019) for follow-up visit with Dr. Sanda Klein (before or after is okay).  An after-visit summary was printed and given to the patient at [facility].  Please see the patient instructions which may contain other information and recommendations beyond what is mentioned above in the assessment and plan.  No orders of the defined types were placed in this  encounter.   Orders Placed This Encounter  Procedures  . Ambulatory referral to Cardiology  . EKG 12-Lead

## 2018-12-24 NOTE — Patient Instructions (Addendum)
I'm excited about you meeting with the nutritionist  Check out the information at familydoctor.org entitled "Nutrition for Weight Loss: What You Need to Know about Fad Diets" Try to lose between 1-2 pounds per week by taking in fewer calories and burning off more calories You can succeed by limiting portions, limiting foods dense in calories and fat, becoming more active, and drinking 8 glasses of water a day (64 ounces) Don't skip meals, especially breakfast, as skipping meals may alter your metabolism Do not use over-the-counter weight loss pills or gimmicks that claim rapid weight loss A healthy BMI (or body mass index) is between 18.5 and 24.9 You can calculate your ideal BMI at the NIH website [employer]  Find inspiration (Pinterest, other sites) to help get you moving  The 10-year ASCVD risk score Denman George DC Montez Hageman., et al., 2013) is: 2.2%   Values used to calculate the score:     Age: 58 years     Sex: Female     Is Non-Hispanic African American: No     Diabetic: No     Tobacco smoker: No     Systolic Blood Pressure: 120 mmHg     Is BP treated: No     HDL Cholesterol: 48 mg/dL     Total Cholesterol: 160 mg/dL    Obesity, Adult Obesity is the condition of having too much total body fat. Being overweight or obese means that your weight is greater than what is considered healthy for your body size. Obesity is determined by a measurement called BMI. BMI is an estimate of body fat and is calculated from height and weight. For adults, a BMI of 30 or higher is considered obese. Obesity can eventually lead to other health concerns and major illnesses, including:  Stroke.  Coronary artery disease (CAD).  Type 2 diabetes.  Some types of cancer, including cancers of the colon, breast, uterus, and gallbladder.  Osteoarthritis.  High blood pressure (hypertension).  High cholesterol.  Sleep apnea.  Gallbladder  stones.  Infertility problems. What are the causes? The main cause of obesity is taking in (consuming) more calories than your body uses for energy. Other factors that contribute to this condition may include:  Being born with genes that make you more likely to become obese.  Having a medical condition that causes obesity. These conditions include: ? Hypothyroidism. ? Polycystic ovarian syndrome (PCOS). ? Binge-eating disorder. ? Cushing syndrome.  Taking certain medicines, such as steroids, antidepressants, and seizure medicines.  Not being physically active (sedentary lifestyle).  Living where there are limited places to exercise safely or buy healthy foods.  Not getting enough sleep. What increases the risk? The following factors may increase your risk of this condition:  Having a family history of obesity.  Being a woman of African-American descent.  Being a man of Hispanic descent. What are the signs or symptoms? Having excessive body fat is the main symptom of this condition. How is this diagnosed? This condition may be diagnosed based on:  Your symptoms.  Your medical history.  A physical exam. Your health care provider may measure: ? Your BMI. If you are an adult with a BMI between 25 and less than 30, you are considered overweight. If you are an adult with a BMI of 30 or higher, you are considered obese. ? The distances around your hips and your waist (circumferences). These may be compared to each other to help diagnose your condition. ? Your skinfold thickness. Your health care provider may gently  pinch a fold of your skin and measure it. How is this treated? Treatment for this condition often includes changing your lifestyle. Treatment may include some or all of the following:  Dietary changes. Work with your health care provider and a dietitian to set a weight-loss goal that is healthy and reasonable for you. Dietary changes may include eating: ? Smaller  portions. A portion size is the amount of a particular food that is healthy for you to eat at one time. This varies from person to person. ? Low-calorie or low-fat options. ? More whole grains, fruits, and vegetables.  Regular physical activity. This may include aerobic activity (cardio) and strength training.  Medicine to help you lose weight. Your health care provider may prescribe medicine if you are unable to lose 1 pound a week after 6 weeks of eating more healthily and doing more physical activity.  Surgery. Surgical options may include gastric banding and gastric bypass. Surgery may be done if: ? Other treatments have not helped to improve your condition. ? You have a BMI of 40 or higher. ? You have life-threatening health problems related to obesity. Follow these instructions at home:  Eating and drinking   Follow recommendations from your health care provider about what you eat and drink. Your health care provider may advise you to: ? Limit fast foods, sweets, and processed snack foods. ? Choose low-fat options, such as low-fat milk instead of whole milk. ? Eat 5 or more servings of fruits or vegetables every day. ? Eat at home more often. This gives you more control over what you eat. ? Choose healthy foods when you eat out. ? Learn what a healthy portion size is. ? Keep low-fat snacks on hand. ? Avoid sugary drinks, such as soda, fruit juice, iced tea sweetened with sugar, and flavored milk. ? Eat a healthy breakfast.  Drink enough water to keep your urine clear or pale yellow.  Do not go without eating for long periods of time (do not fast) or follow a fad diet. Fasting and fad diets can be unhealthy and even dangerous. Physical Activity  Exercise regularly, as told by your health care provider. Ask your health care provider what types of exercise are safe for you and how often you should exercise.  Warm up and stretch before being active.  Cool down and stretch after  being active.  Rest between periods of activity. Lifestyle  Limit the time that you spend in front of your TV, computer, or video game system.  Find ways to reward yourself that do not involve food.  Limit alcohol intake to no more than 1 drink a day for nonpregnant women and 2 drinks a day for men. One drink equals 12 oz of beer, 5 oz of wine, or 1 oz of hard liquor. General instructions  Keep a weight loss journal to keep track of the food you eat and how much you exercise you get.  Take over-the-counter and prescription medicines only as told by your health care provider.  Take vitamins and supplements only as told by your health care provider.  Consider joining a support group. Your health care provider may be able to recommend a support group.  Keep all follow-up visits as told by your health care provider. This is important. Contact a health care provider if:  You are unable to meet your weight loss goal after 6 weeks of dietary and lifestyle changes. This information is not intended to replace advice given to you  by your health care provider. Make sure you discuss any questions you have with your health care provider. Document Released: 11/23/2004 Document Revised: 03/20/2016 Document Reviewed: 08/04/2015 Elsevier Interactive Patient Education  2019 [employer].

## 2018-12-24 NOTE — Telephone Encounter (Signed)
If you can tell me who is in her network, I can make some recommendations; perhaps Efraim Kaufmann can help with this; thank you

## 2018-12-31 ENCOUNTER — Encounter: Payer: BLUE CROSS/BLUE SHIELD | Attending: Family Medicine | Admitting: Dietician

## 2018-12-31 ENCOUNTER — Encounter: Payer: Self-pay | Admitting: Dietician

## 2018-12-31 VITALS — Ht 64.0 in | Wt 254.0 lb

## 2018-12-31 DIAGNOSIS — Z6841 Body Mass Index (BMI) 40.0 and over, adult: Secondary | ICD-10-CM

## 2018-12-31 DIAGNOSIS — Z9189 Other specified personal risk factors, not elsewhere classified: Secondary | ICD-10-CM | POA: Insufficient documentation

## 2018-12-31 NOTE — Assessment & Plan Note (Signed)
Managed by rheum and derm

## 2018-12-31 NOTE — Assessment & Plan Note (Signed)
Controlled today; continue to try to lose weight, DASH guidelines

## 2018-12-31 NOTE — Assessment & Plan Note (Signed)
Patient declines referral to be evaluated for sleep apnea; explained OSA can cause sudden death, heart failure, etc.; she still declines

## 2018-12-31 NOTE — Progress Notes (Signed)
Medical Nutrition Therapy: Visit start time: 1330  end time: 1440 Assessment:  Diagnosis: obesity Past medical history: osteoarthritis, Sjogren's Syndrome Psychosocial issues/ stress concerns:  Patient rates her stress as "low". 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  Preferred learning method:  Jill Alexanders . Hands-on  Current weight: 254 lbs Height: 64 in Medications, supplements: see list  Progress and evaluation:  Patient in for initial medical nutrition therapy appointment. She reports in 2015, she lost 85 lbs and weighed 165 lbs by using a meal replacement diet. She acknowledges that once she began eating more solid food, she felt "addicted to food". She is presently making diet changes to eat less processed foods and is also decreasing her sodium intake. She states she does very little cooking but wants to change that.  She has an erratic meal pattern. She states that since she does not work she does not eat at consistent times.She sometimes eats breakfast or may wait until lunch to eat. She eats lunch "out" 4 days per week often making high fat choices.  She may snack in the evening verses a meal but is working to change that. She made a barley, chicken and vegetable soup this past weekend using low sodium broth as base and adding no salt added tomatoes, etc. She has been eating fruit for an evening snack and would previously eat a large bag of popcorn. She drinks 16 oz of coffee and 8+ cups of water daily. She occasionally drinks a diet soda. She reports a family history of diabetes; a glucose from 10/'19 was 114. She has been conscious of sodium intake because she notices that her fingers swell after a high sodium meal.   Physical activity: no structured exercise. Her apartment complex has a gym and she states it is a safe place to exercise.  Nutrition Care Education:  Weight control: Instructed on a meal plan based on approximately 1600 calories including  carbohydrate counting and how to better balance carbohydrate, protein, healthy fat and non-starchy vegetables. Used food guide plate and food models to discuss balance and portions.  Encouraged to use meal plan as a guide to increase mindfulness regarding food choices, food combinations and portions verses over-restriction which decreases chance of long term success. Gave and reviewed simple meal ideas. Also, gave and reviewed list of  low sodium products as alternatives to higher sodium products. Discussed how a diet based on fruits, vegetables, whole grains, and lean meats is considered more anti-inflammatory. Commended on effort to decrease processed foods. Discussed role of exercise in weight management and decreasing risk of diabetes.   Nutritional Diagnosis:  Fraser-3.3 Overweight/obesity As related to history of frequent dining out making high fat choices, erratic meal pattern and lack of physical activity.  As evidenced by diet and exercise history and BMI of 43.6.  Intervention:  Work to establish a structured,more consistent meal/snack pattern at home of 3 meals spaced 4-5 hours apart. Balance meals with 2-3 oz protein, 2-3 servings of carbohydrate (starch, fruit, milk/yogurt) and non-starchy vegetables. Prepare more meals at home. Keep sides such as fruits, steamed vegetables, raw vegetables on hand to add to entree. Include whole grains: 1/3 cup of cooked whole grains = 1 carb serving. Exercise goal: walk 3 days per week for 20 minutes.  Education Materials given:  . Plate Planner . Food lists/ Planning A Balanced Meal . Sample meal pattern/ menus . Snacking handout . Lower sodium product alternatives . Goals/ instructions Learner/ who was  taught:  . Patient  Level of understanding:  Verbalized understanding Demonstrated degree of understanding via:   Teach back Learning barriers: . None  Willingness to learn/ readiness for change: . Eager, change in progress  Monitoring  and Evaluation:   No follow-up scheduled at this time. Patient was encouraged to call if desires further help with diet/nutrition.

## 2018-12-31 NOTE — Assessment & Plan Note (Signed)
She wants to lose weight; with fam hx of heart issues, she'll see cardiologist to evaluate prior to really starting weight loss, exercise program; discussed motivational factors, seeing nutritionist; trouble shooting for healthier eating

## 2018-12-31 NOTE — Patient Instructions (Addendum)
Work to establish a structured,more consistent meal/snack pattern at home of 3 meals spaced 4-5 hours apart. Balance meals with 2-3 oz protein, 2-3 servings of carbohydrate (starch, fruit, milk/yogurt) and non-starchy vegetables. Prepare more meals at home. Keep sides such as fruits, steamed vegetables, raw vegetables on hand to add to entree. Include whole grains: 1/3 cup of cooked whole grains = 1 carb serving. Exercise goal: walk 3 days per week for 20 minutes.

## 2019-01-08 NOTE — Unmapped (Signed)
Alexandra Peterson is doing well, and her HS is already clear. We discussed sun safety with Humira (no need to avoid sun, but wear appropriate sun screen, get skin checks). She'll begin maintenance dosing on Thurs, March 19th.    [facility] Clinical Assessment & Refill Coordination Note    Alexandra Peterson, DOB: Sep 22, 1961  Phone: [phone] (home)     All above HIPAA information was verified with patient.     Specialty Medication(s):   Inflammatory Disorders: Humira     Current Outpatient Medications   Medication Sig Dispense Refill   ??? adalimumab 80 mg/0.8 mL PnKt INJECT 2 PENS UNDER THE SKIN ON DAY 1, THEN 1 PEN ON DAY 15 3 kit 0   ??? ADALIMUMAB PEN CITRATE FREE 40 MG/0.4 ML INJECT 1 PEN UNDER THE SKIN ONCE WEEKLY 4 each 2   ??? calcium carbonate (TUMS) 200 mg calcium (500 mg) chewable tablet Chew 1 tablet daily.     ??? cholecalciferol, vitamin D3, 50,000 unit capsule Take 50,000 Units by mouth daily.     ??? clindamycin (CLEOCIN) 300 MG capsule Take 1 pill twice daily with food 60 capsule 2   ??? cyclobenzaprine (FLEXERIL) 5 MG tablet cyclobenzaprine 5 mg tablet     ??? ergocalciferol, vitamin D2, 2,000 unit Tab Take 1 tablet by mouth.     ??? ergocalciferol, vitamin D2, 50 mcg (2,000 unit) cap Continuous.     ??? esomeprazole (NEXIUM) 20 MG capsule Take 20 mg by mouth.     ??? fluticasone (FLONASE SENSIMIST) 27.5 mcg/actuation nasal spray Continuous.     ??? fluticasone propionate (FLONASE) 50 mcg/actuation nasal spray into each nostril.     ??? meloxicam (MOBIC) 15 MG tablet Continuous.     ??? meloxicam (MOBIC) 7.5 MG tablet   1   ??? predniSONE (DELTASONE) 5 MG tablet prednisone 5 mg tablet     ??? rifAMPin (RIFADIN) 300 MG capsule Take 1 pill twice daily with food 60 capsule 2     No current facility-administered medications for this visit.         Changes to medications: [name] reports no changes reported at this time.    Allergies   Allergen Reactions   ??? Sulfamethoxazole-Trimethoprim Swelling   ??? Adhesive Rash ??? Bupropion Hcl Other (See Comments)     REACTION: irritability  REACTION: irritability     ??? Celecoxib Other (See Comments)     REACTION: edema  REACTION: edema     ??? Other Other (See Comments)     REACTION: not effective   ??? Sertraline Hcl Other (See Comments)     REACTION: wt gain  REACTION: wt gain     ??? Adhesive Tape-Silicones Rash       Changes to allergies: No    SPECIALTY MEDICATION ADHERENCE     Humira: 0 left on hand       Specialty medication(s) dose(s) confirmed: Regimen is correct and unchanged.     Are there any concerns with adherence? No    Adherence counseling provided? Not needed    CLINICAL MANAGEMENT AND INTERVENTION      Clinical Benefit Assessment:    Do you feel the medicine is effective or helping your condition? Yes    Clinical Benefit counseling provided? Not needed    Adverse Effects Assessment:    Are you experiencing any side effects? Yes, patient reports experiencing mild fatigue. Side effect counseling provided: sometimes patients report this - please let us know if worsens, or  if she notices any muscle sxs    Are you experiencing difficulty administering your medicine? No    Quality of Life Assessment:    How many days over the past month did your HS  keep you from your normal activities? For example, brushing your teeth or getting up in the morning. 0    Have you discussed this with your provider? Not needed    Therapy Appropriateness:    Is therapy appropriate? Yes, therapy is appropriate and should be continued    DISEASE/MEDICATION-SPECIFIC INFORMATION      For patients on injectable medications: Patient currently has 0 doses left.  Next injection is scheduled for Thurs 19.    PATIENT SPECIFIC NEEDS     ? Does the patient have any physical, cognitive, or cultural barriers? No    ? Is the patient high risk? No     ? Does the patient require a Care Management Plan? No     ? Does the patient require physician intervention or other additional services (i.e. nutrition, smoking cessation, social work)? No      SHIPPING     Specialty Medication(s) to be Shipped:   Inflammatory Disorders: Humira    Other medication(s) to be shipped: na     Changes to insurance: No    Delivery Scheduled: Yes, Expected medication delivery date: Monday, March 16.     Medication will be delivered via Same Day Courier to the confirmed home address in [facility].    The patient will receive a drug information handout for each medication shipped and additional FDA Medication Guides as required.  Verified that patient has previously received a Conservation officer, historic buildings.    [name]   [facility] Specialty Pharmacist

## 2019-01-13 MED FILL — HUMIRA PEN CITRATE FREE 40 MG/0.4 ML: 28 days supply | Qty: 4 | Fill #0

## 2019-01-13 MED FILL — HUMIRA PEN CITRATE FREE 40 MG/0.4 ML: 28 days supply | Qty: 4 | Fill #0 | Status: AC

## 2019-02-10 MED FILL — HUMIRA PEN CITRATE FREE 40 MG/0.4 ML: 28 days supply | Qty: 4 | Fill #1 | Status: AC

## 2019-02-10 MED FILL — HUMIRA PEN CITRATE FREE 40 MG/0.4 ML: 28 days supply | Qty: 4 | Fill #1

## 2019-02-10 NOTE — Unmapped (Signed)
[facility] Refill Coordination Note    Specialty Medication(s) to be Shipped:   Inflammatory Disorders: Humira    Other medication(s) to be shipped: na     Alexandra Peterson, DOB: Mar 04, 1961  Phone: [phone] (home)       All above HIPAA information was verified with patient.     Completed refill call assessment today to schedule patient's medication shipment from the [facility] [phone]).       Specialty medication(s) and dose(s) confirmed: Regimen is correct and unchanged.   Changes to medications: Alexandra Peterson reports no changes at this time.  Changes to insurance: No  Questions for the pharmacist: No    Confirmed patient received Welcome Packet with first shipment. The patient will receive a drug information handout for each medication shipped and additional FDA Medication Guides as required.       DISEASE/MEDICATION-SPECIFIC INFORMATION        N/A    SPECIALTY MEDICATION ADHERENCE     Medication Adherence    Patient reported X missed doses in the last month:  2  Specialty Medication:  humira-missed 2 weeks from being sick  Patient is on additional specialty medications:  No  Patient is on more than two specialty medications:  No  Any gaps in refill history greater than 2 weeks in the last 3 months:  yes  Demonstrates understanding of importance of adherence:  yes  Informant:  patient  Reliability of informant:  reliable  Confirmed plan for next specialty medication refill:  delivery by pharmacy  Refills needed for supportive medications:  not needed          Refill Coordination    Has the Patients' Contact Information Changed:  No  Is the Shipping Address Different:  No           Humira injcetions. Patient has 1 pen remaining      SHIPPING     Shipping address confirmed in Epic.     Delivery Scheduled: Yes, Expected medication delivery date: [phone].     Medication will be delivered via Same Day Courier to the home address in Epic [facility].    Peter Daquila D Kysen Wetherington   [facility] Specialty Technician

## 2019-02-17 ENCOUNTER — Other Ambulatory Visit: Payer: Self-pay | Admitting: Family Medicine

## 2019-02-18 DIAGNOSIS — R768 Other specified abnormal immunological findings in serum: Secondary | ICD-10-CM | POA: Insufficient documentation

## 2019-03-06 NOTE — Unmapped (Signed)
[facility] Refill Coordination Note    Specialty Medication(s) to be Shipped:   Inflammatory Disorders: Humira    Other medication(s) to be shipped: na     Alexandra Peterson, DOB: 02-15-1961  Phone: [phone] (home)       All above HIPAA information was verified with patient.     Completed refill call assessment today to schedule patient's medication shipment from the [facility] [phone]).       Specialty medication(s) and dose(s) confirmed: Regimen is correct and unchanged.   Changes to medications: Ambree reports no changes at this time.  Changes to insurance: No  Questions for the pharmacist: No    Confirmed patient received Welcome Packet with first shipment. The patient will receive a drug information handout for each medication shipped and additional FDA Medication Guides as required.       DISEASE/MEDICATION-SPECIFIC INFORMATION        N/A    SPECIALTY MEDICATION ADHERENCE     Medication Adherence    Patient reported X missed doses in the last month:  0  Specialty Medication:  humira cf 40 mg/0.4 ml injections  Patient is on additional specialty medications:  No  Patient is on more than two specialty medications:  No  Any gaps in refill history greater than 2 weeks in the last 3 months:  no  Demonstrates understanding of importance of adherence:  yes  Informant:  patient  Reliability of informant:  reliable  Confirmed plan for next specialty medication refill:  delivery by pharmacy  Refills needed for supportive medications:  not needed          Refill Coordination    Has the Patients' Contact Information Changed:  No  Is the Shipping Address Different:  No           humira cf 40 mg/0.4 ml injections. Patient has 2 pens remaining      SHIPPING     Shipping address confirmed in Epic.     Delivery Scheduled: Yes, Expected medication delivery date: [id number].     Medication will be delivered via Same Day Courier to the home address in Epic [facility].    Mckenley Birenbaum D Kumar Falwell   [facility] Specialty Technician

## 2019-03-12 MED FILL — HUMIRA PEN CITRATE FREE 40 MG/0.4 ML: 28 days supply | Qty: 4 | Fill #2

## 2019-03-12 MED FILL — HUMIRA PEN CITRATE FREE 40 MG/0.4 ML: 28 days supply | Qty: 4 | Fill #2 | Status: AC

## 2019-03-14 NOTE — Unmapped (Signed)
Outside labs from [facility]/Dr.Mitchell's office

## 2019-03-18 ENCOUNTER — Encounter: Admit: 2019-03-18 | Discharge: 2019-03-19 | Payer: PRIVATE HEALTH INSURANCE

## 2019-03-18 DIAGNOSIS — L732 Hidradenitis suppurativa: Principal | ICD-10-CM

## 2019-03-18 NOTE — Unmapped (Signed)
Teledermatology Note- Return Patient   03/18/2019      Encounter Description/Consent: This encounter was conducted from [facility]  via live, face-to-face video conference with the patient. Alexandra Peterson was located in [place].  The patient verified her identity with her date of birth and verbally consented to evaluation and management of her condition through telemedicine.     Total time spent reviewing chart, uploaded images, and discussion with patient: 40 minutes    I spent 35 minutes on the audio/video with the patient. I spent an additional 5 minutes on pre- and post-visit activities.     The patient was physically located in [place] or a state in which I am permitted to provide care. The patient and/or parent/gauardian understood that s/he may incur co-pays and cost sharing, and agreed to the telemedicine visit. The visit was completed via phone and/or video, which was appropriate and reasonable under the circumstances given the patient's presentation at the time.    The patient and/or parent/guardian has been advised of the potential risks and limitations of this mode of treatment (including, but not limited to, the absence of in-person examination) and has agreed to be treated using telemedicine. The patient's/patient's family's questions regarding telemedicine have been answered.     If the phone/video visit was completed in an ambulatory setting, the patient and/or parent/guardian has also been advised to contact their provider???s office for worsening conditions, and seek emergency medical treatment and/or call 911 if the patient deems either necessary.      NOTE: Telemedicine enables health care providers at different locations to provide safe, effective, and convenient care through the use of technology. As with any health care service, there are risks associated with the use of telemedicine and teledermatology, including lack of visualization,and there may be instances where the patient needs to be seen is person  This visit was performed via telemedicine during the COVID-19 Health Crisis during a State of National Emergency.   The impression and recommendations were made on the basis of the history obtained via phone or video connection  and the physical exam reviewed electronically, which is limited by/to the uploaded images and their intrinsic quality.  All questions were answered, and the patient agreed to proceed.    Impression/ Plan:  Hidradenitis suppurativa and arthritis.  -This was doing much better over a short period of time with Humira 40 mg weekly.  If her liver enzymes are stable or improving compared to last week I think she is okay to restart it and we can recheck liver enzymes about 4 weeks later.  This seems like an unlikely cause for elevated transaminases.  -Description of her arthritis symptoms seem much more consistent with an inflammatory arthritis than purely osteoarthritis.  It is possible is related to her Sjogren's or underlying hidradenitis, though a seronegative rheumatoid arthritis or psoriatic arthritis without development of skin psoriasis lesions yet is also a possibility.  I can discuss with her rheumatologist since the if 1 of these might fit and consider an IL-17 antagonist if we become concerned that a TNF inhibitor may be inducing hepatitis and elevation of transaminases.  -It sounds like she has a couple of small sinuses and may benefit from focused procedures in the future.  Once we have a chance to examine in clinic we may be able to make a plan around this if it still requires it.    - The patient was also advised to send a MyChart message or call for an appointment  should any new, changing, or symptomatic lesions/rash develop in the interim.     RV: 3 m  No follow-ups on file.       ________________________________________________________________________________________________________    Ref Provider: Bernita Raisin    Patient interviewed in a private place, accompanied by no one.    CC/HPI:    Alexandra Peterson is a friendly 58 y.o. female , a Return  patient for a ESTABLISHED problem, evaluated today in a teledermatology encounter for:       Chief Complaint   Patient presents with   ??? HS     Patient was seen in january for HS, she was started on Humira but due to recent labs with rhemotology she has had to stop this. She said the humira worked very well for her and has had no flare ups since starting it. She was on clinda/rif but had to stop this after 8 days due to the side effects.    ??? Consent     Patient consented to VV, is in [place], and explained how VV will work.     Ms. Laning is a very pleasant patient seen in January 2020 by Dr. Jettie Booze.  She has a many years history of hidradenitis suppurativa as well as arthritis.  She was started on Humira at her last visit which did seem to be helpful but she developed a sinus infection a few weeks after starting treatment and ended up stopping.  She then restarted again in April but stopped a couple of weeks ago because her liver enzymes were found to be elevated.  She also started meloxicam over the last several months.  Her AST and ALT were about 3-4 times the upper limit of normal last Friday she is having them rechecked today by her rheumatologist.  Her joint work-up has mostly been unremarkable other than serology somewhat concerning for Sjogren's syndrome.  She does endorse worse pain and stiffness in the mornings as well as what sounds like a description of dactylitis.  She has been on many antibiotics in the past and in January was started on clindamycin rifampin, but cannot tolerate them due to headaches. Summary of recent labs reviewed and below.      Hep B core Ab was pos back in 12/19, but was repeated and is neg now. ??  Hep B surface Ab negative.   Heb surface Ag neg.   ??HCV Ab neg. ??  HBV PCR neg.   ??CRB 26.   Quant gold neg.   ??CCP, anti-dsDNA, RF neg. ??   G6PD normal. ??  UA, CMET, CBC with diff normal.   ??Anti-SSA elevated at 6.2 (ULN 0.9).       Prior treatments:  Topical: hibiclens, clindamycin   Systemic: unsure of which antibiotics   Past surgical procedures: excision or unroofing of the left axilla, pilonidal cyst excision years ago    Past laser procedures: none    ROS:     The balance of 10 systems is negative unless otherwise documented.    PE:    General: The patient sits comfortably in view of the camera.  The patient's breathing is observed to be comfortable and normal.  The patient is in no acute distress.  All extremity movements appear intact.  There are no focal neurological deficits observed.   Neuro: Alert, answering questions appropriately.  No skin images were reviewed today.  I did evaluate her scar by video in the left axilla which was well-healed scar about 3 to  4 cm in diameter on the left axilla.    - All other areas examined via photos/video were normal or had no significant findings      Leonette Nutting, MD  03/18/2019

## 2019-03-19 ENCOUNTER — Other Ambulatory Visit: Payer: Self-pay | Admitting: Internal Medicine

## 2019-03-19 DIAGNOSIS — R7989 Other specified abnormal findings of blood chemistry: Secondary | ICD-10-CM

## 2019-03-19 DIAGNOSIS — R945 Abnormal results of liver function studies: Secondary | ICD-10-CM

## 2019-03-21 ENCOUNTER — Other Ambulatory Visit: Payer: Self-pay | Admitting: Family Medicine

## 2019-03-21 NOTE — Unmapped (Signed)
Patient given instructions on video visit and prescreening completed. Patient states understanding of instructions

## 2019-03-25 ENCOUNTER — Encounter
Admit: 2019-03-25 | Discharge: 2019-03-26 | Payer: PRIVATE HEALTH INSURANCE | Attending: Gastroenterology | Primary: Gastroenterology

## 2019-03-25 DIAGNOSIS — R945 Abnormal results of liver function studies: Principal | ICD-10-CM

## 2019-03-25 NOTE — Unmapped (Signed)
[facility] [place] [facility] [phone]      Referring Provider:  Bernita Raisin, MD  [address]  [place], [place] [address]     Primary Care Provider:  Kerman Passey, MD    Other Specialist(s):   [facility] Derm Dr. Janyth Contes       PATIENT PROFILE:        Alexandra Peterson is a 58 y.o. female (DOB: Jul 13, 1961) who is seen in consultation at the request of Dr. Clovis Riley for evaluation of elevated liver enzymes. This is a video visit due to COVID-19. Patient is at home in [place], [place].      HISTORY OF PRESENT ILLNESS: This is a 58 y.o. Caucasian female with a history of hidradenitis suppurativa (HS), inflammatory polyarthritis (ANA+, SSA+, CRP increased)  and obesity.  She is referred for elevated transaminases after starting Humira.  The patient reports no history of known liver disease.  She had a mildly elevated AST in the past (approximately a decade ago).  Her liver enzymes from the end of 2019 reveal transaminases in the 20s.    She reports seeing dermatology at [facility] for her HS in January 2020.  This has been a longstanding problem.  She was treated with clindamycin and rifampin which she only tolerated for about 8.5 days due to headaches.  She denies any other antibiotics in the past 6 months.  She denies taking any antibiotics for sinus or respiratory issues.  After confirming that she was negative for hepatitis B and C, the patient was started on Humira on February 20.  She completed her 2 loading doses and started her weekly 40 mg dose on 3/26.  After that dose, she noted increased sinus drainage and a sore throat.  She took her second 40 mg dose on 4/2 and again noted sinus drainage and a sore throat.  She did not take her Humira for 2 weeks.  She resumed on 4/17 since she was feeling better and had her last injection on 5/8.  She was noted on labs from 5/15 to have an AST of 93 and an ALT 116 with a total bilirubin of 0.7, AP 85 and albumin of 4.6.  This was the first set of labs since starting Humira 2/20.  She denied any symptoms of dark urine, right upper quadrant pain, fever, rash or abdominal discomfort while taking the Humira.  She reports almost immediate relief from her HS and improvement in her inflammatory polyarthropathy when she was taking Humira.  She also notes that she started taking meloxicam 7.5 mg back in the fall of 2019.  She was taking this medication daily with some improvement in her joint discomfort.  The dose was increased to meloxicam 15 mg daily starting in December 2019.  When her liver enzymes were noted to be elevated in May 2020, she stopped taking her meloxicam as well.    In regards to her weight, she has restarted the [employer].  She reports following this diet plan in 2015 and losing 85 pounds.  She has meal replacement bars that contain soy protein and she reports eating 6 times per day.  She reports eating lean proteins and greens.  She has also noted that after starting the new diet, she is no longer having reflux symptoms and has stopped her pantoprazole.    Patient denies any jaundice, pruritis, fever, rash, SOB, CP, increased abdominal girth, LE edema, melena, BRBPR, confusion, depression, nausea, vomiting, or diarrhea.  REVIEW OF SYSTEMS:     The balance of 12 systems reviewed is negative except as noted in the HPI.     PAST MEDICAL HISTORY:    Past Medical History:   Diagnosis Date   ??? Arthritis    ??? Disorder of skin or subcutaneous tissue    ??? H/O seasonal allergies    ??? Hidradenitis    ??? Sjogren's disease (CMS-HCC)        PAST SURGICAL HISTORY:    Past Surgical History:   Procedure Laterality Date   ??? CESAREAN SECTION      1995   ??? cyst removed      1981   ??? HYSTEROSCOPY      2014   ??? KNEE ARTHROSCOPY W/ ACL RECONSTRUCTION      1993   ??? lumbar surgeries         MEDICATIONS:    Current Outpatient Medications:   ???  cholecalciferol, vitamin D3, 50,000 unit capsule, Take 50,000 Units by mouth daily., Disp: , Rfl:   ???  fluticasone (FLONASE SENSIMIST) 27.5 mcg/actuation nasal spray, 1 spray daily. , Disp: , Rfl:   ???  fluticasone propionate (FLONASE) 50 mcg/actuation nasal spray, into each nostril., Disp: , Rfl:   ???  meloxicam (MOBIC) 15 MG tablet, Take 15 mg by mouth daily. , Disp: , Rfl:   ???  adalimumab 80 mg/0.8 mL PnKt, INJECT 2 PENS UNDER THE SKIN ON DAY 1, THEN 1 PEN ON DAY 15 (Patient not taking: Reported on 03/26/2019), Disp: 3 kit, Rfl: 0  ???  ADALIMUMAB PEN CITRATE FREE 40 MG/0.4 ML, INJECT 1 PEN UNDER THE SKIN ONCE WEEKLY (Patient not taking: Reported on 03/26/2019), Disp: 4 each, Rfl: 2  ???  pantoprazole (PROTONIX) 40 MG tablet, pantoprazole 40 mg tablet,delayed release, Disp: , Rfl:     ALLERGIES:    Sulfamethoxazole-trimethoprim; Adhesive; Bupropion hcl; Celecoxib; Other; Sertraline hcl; and Adhesive tape-silicones    SOCIAL HISTORY:  She lives in [place], [place]. No significant alcohol and no tobacco. No BT. One son age 27 in good health. 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 Last worked in 2017.    FAMILY HISTORY:  No AI disease that she is aware of except psoriasis in maternal aunt who also had DM. Mom with HTN, dyslipidemia and osteoporosis. Brother with dyslipidemia and HTN. Sister with HTN and pre-DM. Grandmother with colon cancer. No cirrhosis or liver disease.      VITAL SIGNS:    There were no vitals taken for this visit.  There is no height or weight on file to calculate BMI.    PHYSICAL EXAM:    Exam not performed due to patient factors    DIAGNOSTIC STUDIES:  I have reviewed all pertinent diagnostic studies, including:    GI Procedures:  EGD 2012 ok per patient- No Barritts  Colonoscopy 2013 10 yr follow-up    Radiographic studies:  None Liver US scheduled for 04/01/19    Outside Labs:  Normal LFT's 03/2012 04/2018 08/15/18 (Care Everywhere)  04/2018 HCV Ab neg, HIV neg normal TG and HDL  08/15/18 ANA 1:80 nuc, speckled   09/11/18 AST 26 ALT 27 AP 80 TB 0.4 alb 4.4 WBC 9.3 hgb 13.8 plt 254 CRP 18 CCP neg dsDNA neg RF neg C3 and C4 normal BUN 11 Cr 0.64  + ANA 1:80 + SSA 6.2  11/02/18 CRP 26 Quant gold neg HCV Ab neg HBsAg neg HBcAb pos  11/26/18 HBV  DNA not detected, HBsAb neg HBcAb total neg    Laboratory results:    No visits with results within 1 Week(s) from this visit.   Latest known visit with results is:   No results found for any previous visit.       ASSESSMENT: This is a very pleasant 58 y.o. WF with a history of HS and inflammatory polyarthropathy who was noted in May to have elevated transaminases ALT 116 AST 93 in May after starting adalimumab (Humira) in Feb 2020. She has been taking meloxicam since last fall and I believe that her transaminase (TA) bump is unlikely related to this NSAID. She also took rifampin briefly in Jan 2020 which can cause an elevation in LFT's but is usually more cholestatic if it persisted this long. I think temporally and based on the transaminase predominance of liver enzymes that this is most likely related to the initiation of Humira. There are 2 types of liver injury with Humira- one is more of a direct injury which is quicker in onset with lower TA'a and normal Tbili vs an autoimmune-like injury which often has a longer latency and is more severe and usually results in positive AI serologies (such as ASMA and IgG since patient already has +ANA).  I think she likely has the former rather than the latter. Her liver enzymes should improve relatively rapidly with holding the Humira. Hurmira can cause transient elevations in TA's (up to 3X ULN) which resolve with ongoing dosing. Hence, there is some conditioning that goes on in the liver with many drugs and her starting stopping pattern of taking the Humira may have interrupted this process.     She does not appear to have any underlying liver disease at baseline especially with TA's in the 20's for years. She may have some underlying hepatic steatosis with her weight (which she is working on) currently. Her HBcAb positivity was likely a false postive as her HBsAg and HBV DNA were negative and subsequent HBcAb was negative. I will check a HBeAb which if negative confirms the false positive. Based on current HBV labs, she has never been exposed or vaccinated for HBV. She does not have occult HBV. Will also check for HAV exposure and if negative, she should likely get [employer] vaccination in future.      For now, I told her that it is fine to resume the meloxicam so she has some relief with her polyarthritis. She will have LFT's rechecked in the next week off of Humira along with AI serologies to r/o AI-like liver injury. There are several choices as what to do next. There does not appear to be cross reactivity among the anti-TNF's so she could be re-challenged with a different anti-TNF. Dr. Arvil Chaco notes also mentioned possibly using an IL-17 inhibitor which is also a reasonable approach. I think Derm and Rheum should decide based on her overall picture of HS and inflammatory polyarthropathy. I don't think I would re-challenge her with Humira as we don't have any data of whether her LFT's were actually much higher previously as we only have the one set from May.      PLAN:       -- Safe to resume Meloxicam  -- Will review [place] which will be performed 04/01/19  -- Labs in the next week at [facility] to include liver panel, ASMA, IgG, HBeAb, HAV total Ab  -- Please see above discussion regarding biologic to treat HS and inflammatory polyarthritis  -- Please feel free  to call or page with questions    Anselm Jungling. Piedad Climes, MD  [facility]    I spent 59 minutes on the audio/video with the patient. I spent an additional 25 minutes on pre- and post-visit activities.     The patient was physically located in [place] or a state in which I am permitted to provide care. The patient and/or parent/gauardian understood that s/he may incur co-pays and cost sharing, and agreed to the telemedicine visit. The visit was completed via phone and/or video, which was appropriate and reasonable under the circumstances given the patient's presentation at the time.    The patient and/or parent/guardian has been advised of the potential risks and limitations of this mode of treatment (including, but not limited to, the absence of in-person examination) and has agreed to be treated using telemedicine. The patient's/patient's family's questions regarding telemedicine have been answered.     If the phone/video visit was completed in an ambulatory setting, the patient and/or parent/guardian has also been advised to contact their provider???s office for worsening conditions, and seek emergency medical treatment and/or call 911 if the patient deems either necessary.

## 2019-03-26 ENCOUNTER — Ambulatory Visit: Payer: BLUE CROSS/BLUE SHIELD | Admitting: Family Medicine

## 2019-03-26 ENCOUNTER — Telehealth: Payer: Self-pay

## 2019-03-26 NOTE — Unmapped (Signed)
Patient Cardiology appt scheduled and encounter began for appt 5/28.      Patient reports though referral states CP, she does not feel it is CP.  She states she thought she should be assessed as she has noticed palpitations at times as well as some DOE. However, she feels it may be weight gain related, but to be safe wanted to see Cardiology.      Patient states she had an EKG done recently at [facility] in [place].  I called and they are going to fax [place] the EKG today.

## 2019-03-26 NOTE — Telephone Encounter (Signed)
Copied from CRM [id number]. Topic: General - Inquiry >> Mar 26, 2019  2:23 PM Reggie Pile, [place] wrote: Reason for CRM: Maralyn Sago from [facility] Cardiology is calling in regards to patients referral. They are requesting the patient's most recent EKG results being faxed over to the office at the Attention of Maralyn Sago. Fax is [phone]. Please advise as patient's appointment is tomorrow.  EKG has been printed and faxed to the above listed number.

## 2019-03-27 ENCOUNTER — Ambulatory Visit
Admit: 2019-03-27 | Discharge: 2019-03-28 | Payer: PRIVATE HEALTH INSURANCE | Attending: Adult Health | Primary: Adult Health

## 2019-03-27 DIAGNOSIS — R079 Chest pain, unspecified: Principal | ICD-10-CM

## 2019-03-27 DIAGNOSIS — Z0000 Encounter for general adult medical examination without abnormal findings: Secondary | ICD-10-CM

## 2019-03-27 NOTE — Unmapped (Signed)
DIVISION OF CARDIOLOGY   [employer] [facility], [place]        Date of Service: 03/27/2019    Assessment/Plan   1. Chest pain, unspecified type  Atypical chest heaviness without exertional component. Recent EKG rev'd & unremarkable.  Low suspicion for ischemic etiology. CVD risk factors: sedentary lifestyle/obesity, autoimmune disease (Sjogren's) and brother w/ stroke. Made shared decision to pursue exercise stress test for formal evaluation to ensure no exercise induced ischemia and provide reassurance it is safe to start regular exercise. Will then see her in 6 weeks.   - Ambulatory referral to Cardiology  - Exercise Treadmill Test (ETT) to rule out ischemia    2. Routine health maintenance  Requesting [place] PCP referral due to insurance coverage change  - Ambulatory referral to [facility]     Return to clinic:    Return in about 6 weeks (around 05/08/2019).     I spent more than 30 minutes with the patient greater than 50% was for the counseling and coordination of care.    Subjective:   ZOX:[id number] Alexandra Gunnels, MD  Chief complaint:  58 y.o. female with history of Sj??gren syndrome and hidradenitis who is referred by Dr. Marikay Alar for evaluation of chest discomfort.     History of Present Illness:  Today, patient describes feeling a chest weight/heaviness that feels to be pulling her down. No exertional component and finds that it typically occurs at rest. Not described as a chest pain. No correlation to meals/eating.  Symptoms have been ongoing for the last several months.     She currently does not get formal exercise but wants to start. Overall explains that she feels she has become too sedentary with some lost muscle and worsening balance. She can walk without issues normally, but will occasionally become SOB when at rest. She has recently started on a diet program to lose weight. Reports recent feelings of low energy.  Reports tachycardia infrequent that typically at rest. Denies lightheadedness or dizzness    Patient was recently diagnosed with Sj??gren syndrome last fall. She has been prescribed humira, but not taking currently due to concerns with her liver labs    Reports her mother has had an aneurysm and her brother has a history of stroke, atrial fibrillation, HTN, and CKD. Sister also has HTN. Patient has no smoking history. Reports no significant caffeine or ETOH intake.     Past Medical History    Past Medical History:   Diagnosis Date   ??? Arthritis    ??? Disorder of skin or subcutaneous tissue    ??? H/O seasonal allergies    ??? Hidradenitis    ??? Sjogren's disease (CMS-HCC)        Patient Active Problem List   Diagnosis   ??? Cervical spondylosis   ??? Lumbar spondylosis with myelopathy       Medications:  Current Outpatient Medications   Medication Sig Dispense Refill   ??? adalimumab 80 mg/0.8 mL PnKt INJECT 2 PENS UNDER THE SKIN ON DAY 1, THEN 1 PEN ON DAY 15 (Patient not taking: Reported on 03/26/2019) 3 kit 0   ??? ADALIMUMAB PEN CITRATE FREE 40 MG/0.4 ML INJECT 1 PEN UNDER THE SKIN ONCE WEEKLY (Patient not taking: Reported on 03/26/2019) 4 each 2   ??? cholecalciferol, vitamin D3, 50,000 unit capsule Take 50,000 Units by mouth daily.     ??? fluticasone (FLONASE SENSIMIST) 27.5 mcg/actuation nasal spray 1 spray daily.      ??? fluticasone propionate (  FLONASE) 50 mcg/actuation nasal spray into each nostril.     ??? meloxicam (MOBIC) 15 MG tablet Take 15 mg by mouth daily.      ??? pantoprazole (PROTONIX) 40 MG tablet pantoprazole 40 mg tablet,delayed release       No current facility-administered medications for this visit.        Allergies  Allergies   Allergen Reactions   ??? Sulfamethoxazole-Trimethoprim Swelling   ??? Adhesive Rash   ??? Bupropion Hcl Other (See Comments)     REACTION: irritability  REACTION: irritability     ??? Celecoxib Other (See Comments)     REACTION: edema  REACTION: edema     ??? Other Other (See Comments)     REACTION: not effective   ??? Sertraline Hcl Other (See Comments)     REACTION: wt gain REACTION: wt gain     ??? Adhesive Tape-Silicones Rash       Social History:   Social History     Tobacco Use   ??? Smoking status: Never Smoker   ??? Smokeless tobacco: Never Used   Substance Use Topics   ??? Alcohol use: Never     Frequency: Never   ??? Drug use: Never       Family History:  Family History   Problem Relation Age of Onset   ??? Aneurysm Mother    ??? Hypertension Mother    ??? Cancer Father    ??? Melanoma Brother    ??? Stroke Brother    ??? Atrial fibrillation Brother    ??? Hypertension Brother    ??? Hypertension Sister    ??? Basal cell carcinoma Neg Hx    ??? Squamous cell carcinoma Neg Hx        ROS- 12 system review is negative other than what is specified in the History of Present Illness.      Objective:   Physical Exam  Vitals:    03/27/19 1353   BP: 129/64   BP Site: L Arm   BP Position: Sitting   BP Cuff Size: Large   Pulse: 76   Temp: 37 ??C (98.6 ??F)   TempSrc: Oral   SpO2: 98%   Weight: (!) 112.8 kg (248 lb 11.2 oz)   Height: 165.1 cm (5' 5)     Wt Readings from Last 3 Encounters:   03/27/19 (!) 112.8 kg (248 lb 11.2 oz)   09/18/18 (!) 114.9 kg (253 lb 6.4 oz)   06/18/18 (!) 112.8 kg (248 lb 9.6 oz)     General-  Patient is well-appearing in no acute distress  Neurologic- Alert and oriented X3.  Cranial nerve II-XII grossly intact.  HEENT-  Normocephalic atraumatic head.  No scleral icterus.  Normal dentition and oral pharynx.  Neck- Supple, no carotid bruis, no JVD  Lungs- clear to auscultation, no wheezes, rhonchi, or rhales.  Heart-  Regular rhythm and rate. No MRG  Abdomen- Soft, nontender, no organomegally.  Extremities-  No clubbing or cyanosis. No pitting edema to LE bilaterally  Pulses- 2+ pulses in radial and dorsalis pedis bilaterally.  Psych- Normal mood, appropriate.    Laboratory data:  No results found for: PROBNP    No results found for: WBC, HGB, HCT, PLT    No results found for: NA, K, CL, CO2, BUN, CREATININE, GLU, CALCIUM, MG, PHOS    No results found for: TSH, ALBUMIN, ALT, AST, INR Electrocardiogram:  From 12/24/2018 showed NSR, no ST or T wave abnormalities.    Lipids:  From 05/22/2018 at [facility] showed:  Cholesterol = 160  Triglycerides = 98  HDL = 48  LDL = 93      This note was entered by Renato Shin acting as scribe for [employer], AGNP-C.  Signature: CAHL  Date: 03/31/19  Time: 11:37 AM    I have reviewed the documentation provided by the scribe and confirm that it accurately reflects the service I personally performed and the decisions made by me.  Signature: CJR  Date: 03/31/19  Time: 11:37 AM

## 2019-03-27 NOTE — Unmapped (Signed)
Patient called and left me a message asking for [facility] orders. I contacted Dr Piedad Climes and she states she will put in the orders. I contacted the patient to let her know.

## 2019-03-27 NOTE — Unmapped (Addendum)
Will get exercise stress test       Patient Education        Exercise Electrocardiogram: About This Test  What is it?    An exercise electrocardiogram (EKG or ECG) is a test that checks for changes in your heart while you exercise. Sometimes your doctor can only see heart problems during exercise or while you have symptoms.  This test is sometimes called an exercise EKG, stress test, or treadmill test.  Why is this test done?  You may need this test to check your heart's electrical activity. It can help find out if a heart problem is causing chest pain or find the cause of symptoms that happen during activity, such as dizziness or fainting. It can also help your doctor decide on the best treatment for certain heart problems.  How do you prepare for the test?  ?? Do not smoke or eat a heavy meal before this test.  ?? Wear flat, comfortable shoes (no bedroom slippers) and loose, lightweight shorts or sweatpants. Walking or running shoes are best.  ?? Tell your doctor ALL the medicines, vitamins, supplements, and herbal remedies you take. Some may increase the risk of problems during your test. Your doctor will tell you if you should stop taking any of them before the test and how soon to do it.  How is the test done?  You most likely will either walk on a treadmill or pedal a stationary bicycle.  You will have a blood pressure cuff on your upper arm. Small pads or patches (electrodes) will be attached to your skin on each arm and leg and on your chest. A special paste or pad may go between the electrodes and your skin. Your doctor may wrap your chest with an elastic band to keep the electrodes from falling off.  On the treadmill, you will start out slowly in a level or slightly inclined position. After certain periods of time, the speed and steepness of the treadmill will be increased so that you will be walking faster and at a greater incline.  On the stationary bicycle, you will pedal fast enough to keep a certain speed. After certain periods of time, the resistance will be increased, making it harder to pedal.  In both tests:  ?? Your EKG, heart rate, and blood pressure are recorded.  ?? You might be asked to use numbers to say how hard you are exercising. The higher the number, the harder you think you are exercising.  ?? The test will continue until:  ? You need to stop.  ? You have reached your maximum heart rate. Heart rate is how many times your heart beats in a minute. Maximum heart rate is the fastest your heart can beat when you are exercising as hard as you can. Maximum heart rate changes as you get older.  ? You have angina symptoms, such as chest pain or pressure.  ? You are very tired or very short of breath.  ? Your doctor feels you need to stop because of a change in your heartbeat or blood pressure.  After the test, you will be able to sit or lie down and rest. Your EKG and blood pressure will be checked for about 5 to 10 minutes.  What are the risks of the test?  ?? There is very little chance of having a problem from this test.  ?? Risks include:  ? Irregular heartbeats during the test.  ? Severe angina symptoms.  ?  Fainting.  ? Falling.  ? Heart attack.  ?? No electricity passes through your body during the test. There is no danger of getting an electrical shock.  How long does the test take?  The test usually takes 15 to 30 minutes.  What happens after the test?  ?? You will probably be able to go home right away. It depends on the reason for the test.  ?? You can go back to your usual activities right away.  When should you call for help?  Call 911 anytime you think you may need emergency care. For example, call if:  ?? You passed out (lost consciousness).  ?? You have been diagnosed with angina, and you have angina symptoms that do not go away with rest or are not getting better within 5 minutes after you take a dose of nitroglycerin.  ?? You have symptoms of a heart attack. These may include:  ? Chest pain or pressure, or a strange feeling in the chest.  ? Sweating.  ? Shortness of breath.  ? Nausea or vomiting.  ? Pain, pressure, or a strange feeling in the back, neck, jaw, or upper belly or in one or both shoulders or arms.  ? Lightheadedness or sudden weakness.  ? A fast or irregular pulse.  After you call  911, the operator may tell you to chew 1 adult-strength or 2 to 4 low-dose aspirin. Wait for an ambulance. Do not try to drive yourself.  Watch closely for changes in your health, and be sure to contact your doctor if you have any problems.  Follow-up care is a key part of your treatment and safety. Be sure to make and go to all appointments, and call your doctor if you are having problems. It's also a good idea to keep a list of the medicines you take. Ask your doctor when you can expect to have your test results.  Where can you learn more?  Go to [facility] at https://myuncchart.org  [facility] under [employer]. Enter [id number] in the search box to learn more about Exercise Electrocardiogram: About This Test.  Current as of: December 15, 2019Content Version: 12.4  ?? 2006-2020 [employer]wise, [employer].  Care instructions adapted under license by [facility]. If you have questions about a medical condition or this instruction, always ask your healthcare professional. Healthwise, Incorporated disclaims any warranty or liability for your use of this information.

## 2019-03-28 NOTE — Unmapped (Signed)
03/28/19    Travel Screening Questions Completed.    Travel Screening Questions/Answers:  1). Have you traveled within the last 14 days?: No  2). Do you have any of the following symptoms that are new or worsening: cough, shortness of breath, loss of taste/smell, sore throat, fever or feeling feverish, repeated shaking chills, muscle pain, vomiting, or diarrhea?: No  3). Have you had close contact with a person with confirmed COVID-19 in the last 21 days before symptoms began?: No  4). Have you tested positive in the last 21 days for COVID-19?: No      Negative Travel Screen: Patient answered NO to questions 2, 3, and 4. Offer Telephone visit, Virtual Visit or In Person Visit according to the current scheduling protocol for your clinic.       The call was handled in the following manner: Scheduled for In Person Visit

## 2019-03-31 ENCOUNTER — Ambulatory Visit: Payer: BLUE CROSS/BLUE SHIELD | Admitting: Family Medicine

## 2019-04-01 ENCOUNTER — Ambulatory Visit
Admission: RE | Admit: 2019-04-01 | Discharge: 2019-04-01 | Disposition: A | Discharge status: Home / Self Care | Payer: BLUE CROSS/BLUE SHIELD | Source: Ambulatory Visit | Attending: Internal Medicine | Admitting: Internal Medicine

## 2019-04-01 DIAGNOSIS — R945 Abnormal results of liver function studies: Secondary | ICD-10-CM

## 2019-04-01 DIAGNOSIS — R7989 Other specified abnormal findings of blood chemistry: Secondary | ICD-10-CM

## 2019-04-01 LAB — HEPATITIS B E ANTIBODY: Lab: NEGATIVE

## 2019-04-01 LAB — TOTAL PROTEIN: Lab: 7.1

## 2019-04-01 LAB — SMOOTH MUSCLE ANTIBODY: Lab: 22 — ABNORMAL HIGH

## 2019-04-01 LAB — HEPATIC FUNCTION PANEL
ALKALINE PHOSPHATASE: 76 IU/L (ref 39–117)
ALT (SGPT): 44 IU/L — ABNORMAL HIGH (ref 0–32)
AST (SGOT): 35 IU/L (ref 0–40)
BILIRUBIN DIRECT: 0.18 mg/dL (ref 0.00–0.40)
BILIRUBIN TOTAL: 0.6 mg/dL (ref 0.0–1.2)
TOTAL PROTEIN: 7.1 g/dL (ref 6.0–8.5)

## 2019-04-01 LAB — IMMUNOGLOBULIN G, QN, SERUM: Lab: 1462

## 2019-04-01 LAB — HEPATITIS A TOTAL ANTIBODY: Lab: NEGATIVE

## 2019-04-02 NOTE — Unmapped (Signed)
refaxed lab results to Oda Kilts at [place].

## 2019-04-02 NOTE — Unmapped (Signed)
Patient called to ask if lab results could be faxed to Oda Kilts at [facility]. Lab results faxed through Epic.

## 2019-04-02 NOTE — Unmapped (Signed)
Patient stated that her doctor stopped Humira for a while because of her liver function. She just restarted the Humira last Friday and has 4 pens on hand. Rescheduling call

## 2019-04-07 ENCOUNTER — Encounter: Admit: 2019-04-07 | Discharge: 2019-04-07 | Payer: PRIVATE HEALTH INSURANCE

## 2019-04-07 DIAGNOSIS — R079 Chest pain, unspecified: Principal | ICD-10-CM

## 2019-04-07 NOTE — Unmapped (Signed)
Patient arrived for cardiac stress testing. Risks, benefits, and alternatives were explained to the patient who agreeable to proceed. Consented form signed. I was present for the entirety of the ECG stress portion of the exam.     Reason for stress test: atypical chest pain  Baseline HR: 84  HR response to exercise: 146 (90% of maximum predicted)  Baseline BP: 138/86  BP response to exercise: 235/94  Exercise duration: 6 minutes  Baseline ECG: NSR without evidence of ischemia or prior infarct.  Stress ECG: No change from baseline.  Symptoms: No chest pain. Test stopped due to fatigue and knees hurting  ECG stress portion of the exam: No evidence of ischemia or injury  Impression: Normal stress test

## 2019-04-24 MED ORDER — ADALIMUMAB PEN CITRATE FREE 40 MG/0.4 ML
2 refills | 0 days | Status: CP
Start: 2019-04-24 — End: ?
  Filled 2019-05-01: qty 4, 28d supply, fill #0

## 2019-04-24 NOTE — Unmapped (Signed)
Refill request pending for Humira

## 2019-04-24 NOTE — Unmapped (Signed)
[facility] Refill Coordination Note    Specialty Medication(s) to be Shipped:   Inflammatory Disorders: Humira    Other medication(s) to be shipped: na     Alexandra Peterson, DOB: 1961-02-07  Phone: [phone] (home)       All above HIPAA information was verified with patient.     Completed refill call assessment today to schedule patient's medication shipment from the [facility] [phone]).       Specialty medication(s) and dose(s) confirmed: Regimen is correct and unchanged.   Changes to medications: Alexandra Peterson reports no changes at this time.  Changes to insurance: No  Questions for the pharmacist: No    Confirmed patient received Welcome Packet with first shipment. The patient will receive a drug information handout for each medication shipped and additional FDA Medication Guides as required.       DISEASE/MEDICATION-SPECIFIC INFORMATION        N/A    SPECIALTY MEDICATION ADHERENCE     Medication Adherence    Patient reported X missed doses in the last month:  0  Specialty Medication:  humira cf 40 mg/0.4 ml   Patient is on additional specialty medications:  No  Patient is on more than two specialty medications:  No  Any gaps in refill history greater than 2 weeks in the last 3 months:  no  Demonstrates understanding of importance of adherence:  yes  Informant:  patient  Reliability of informant:  reliable  Confirmed plan for next specialty medication refill:  delivery by pharmacy  Refills needed for supportive medications:  not needed                humira cf 40 mg/0.4 ml . 1 pen for 6/26 dose remaining      SHIPPING     Shipping address confirmed in Epic.     Delivery Scheduled: Yes, Expected medication delivery date: [id number].  However, Rx request for refills was sent to the provider as there are none remaining.     Medication will be delivered via Same Day Courier to the home address in Epic WAM.    Alexandra Peterson   [facility] Specialty Technician

## 2019-04-24 NOTE — Unmapped (Signed)
Assessment and Plan:     Alexandra Peterson was seen today for establish care.    Diagnoses and all orders for this visit:    Routine health maintenance  Discussed healthy behaviors. Will return in 6 weeks for CPE.   -     Ambulatory referral to Family Practice    History of vitamin D deficiency  Vitamin D borderline low at 21.08 (08/15/18)  Will recheck at CPE visit in 6 weeks.   -     Cancel: Vitamin D 25 Hydroxy (25OH D2 + D3)    Inflammatory polyarthropathy of multiple sites (CMS-HCC)  Overall well controlled with meloxicam 15 mg daily. Continue as directed.     Sjogren's syndrome with keratoconjunctivitis sicca (CMS-HCC)  Patient following with rheumatology.     HPI:      Alexandra Peterson  is here to establish care. Previously seen at [facility]. Most recently she was following with [facility]. PMH includes lumbar spondylosis, Sjogren's syndrome, hidradenitis suppurativa, osteoarthritis, rosacea. Last CPE 04/2018.     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     Patient has hx of Sjogren's syndrome. She notes eyes are dry and itchy in the morning, denies any other issues. She is following with rheumatology. Next visit scheduled for 05/2019.  She also has hx of severe osteoarthritis. Current treatment includes meloxicam 15 mg daily.     She has hx of hidradenitis suppurativa. She is following with dermatology - next visit scheduled in 1-2 months. Current treatment: Humira, started in 11/2018. She notes one flare last week in her right axilla. She states lesion is pea sized and that she plans to continue to monitor.     Patient notes she recently started weight loss program and notes she has lost 26 lbs in 7 weeks. She states she feels really good, noticing increased energy and better mood. The weight loss program includes 5 meal replacement bars and 1 home cooked meal per day. She is drinking 96 oz of water daily.    Patient endorses family hx of osteoporosis (mother). She notes she fractured her left thumb several years ago. She is postmenopausal.    Recent Vitamin D borderline low at 21.08 (08/15/18). She is taking daily Vitamin D supplement.    HM:   Patient due for zoster vaccine. She had chicken pox as a child.        [facility] Components:     Goals    None         Medication adherence and barriers to the treatment plan have been addressed. Opportunities to optimize healthy behaviors have been discussed. Patient / caregiver voiced understanding.      Past Medical/Surgical History:     Past Medical History:   Diagnosis Date   ??? Arthritis    ??? Disorder of skin or subcutaneous tissue    ??? H/O seasonal allergies    ??? Hidradenitis    ??? Sjogren's disease (CMS-HCC)      Past Surgical History:   Procedure Laterality Date   ??? CESAREAN SECTION      1995   ??? cyst removed      1981   ??? HYSTEROSCOPY      2014   ??? KNEE ARTHROSCOPY W/ ACL RECONSTRUCTION      1993   ??? lumbar surgeries         Family History:     Family History   Problem Relation Age of Onset   ???  Aneurysm Mother    ??? Hypertension Mother    ??? Osteoporosis Mother    ??? Cancer Father    ??? Melanoma Brother    ??? Stroke Brother    ??? Atrial fibrillation Brother    ??? Hypertension Brother    ??? Hypertension Sister    ??? Basal cell carcinoma Neg Hx    ??? Squamous cell carcinoma Neg Hx        Social History:     Social History     Socioeconomic History   ??? Marital status: Divorced     Spouse name: None   ??? Number of children: None   ??? Years of education: None   ??? Highest education level: None   Occupational History   ??? None   Social Needs   ??? Financial resource strain: None   ??? Food insecurity     Worry: None     Inability: None   ??? Transportation needs     Medical: None     Non-medical: None   Tobacco Use   ??? Smoking status: Never Smoker   ??? Smokeless tobacco: Never Used   Substance and Sexual Activity   ??? Alcohol use: Never     Frequency: Never   ??? Drug use: Never   ??? Sexual activity: None   Lifestyle   ??? Physical activity     Days per week: None     Minutes per session: None   ??? Stress: None   Relationships   ??? Social Wellsite geologist on phone: None     Gets together: None     Attends religious service: None     Active member of club or organization: None     Attends meetings of clubs or organizations: None     Relationship status: None   Other Topics Concern   ??? Exercise No   ??? Living Situation Yes   ??? Do you use sunscreen? Yes   ??? Tanning bed use? No   ??? Are you easily burned? No   ??? Excessive sun exposure? No   ??? Blistering sunburns? No   Social History Narrative   ??? None       Allergies:     Sulfamethoxazole-trimethoprim; Adhesive; Bupropion hcl; Celecoxib; Other; Sertraline hcl; and Adhesive tape-silicones    Current Medications:     Current Outpatient Medications   Medication Sig Dispense Refill   ??? adalimumab 80 mg/0.8 mL PnKt INJECT 2 PENS UNDER THE SKIN ON DAY 1, THEN 1 PEN ON DAY 15 3 kit 0   ??? ADALIMUMAB PEN CITRATE FREE 40 MG/0.4 ML Inject the contents of 1 pen (40mg ) under the skin once weekly. 4 each 2   ??? cholecalciferol, vitamin D3, 50,000 unit capsule Take 50,000 Units by mouth daily.     ??? meloxicam (MOBIC) 15 MG tablet Take 15 mg by mouth daily.      ??? multivitamin with minerals (HAIR,SKIN AND NAILS) tablet Take 1 tablet by mouth daily.     ??? fluticasone (FLONASE SENSIMIST) 27.5 mcg/actuation nasal spray 1 spray daily.      ??? fluticasone propionate (FLONASE) 50 mcg/actuation nasal spray into each nostril.       No current facility-administered medications for this visit.        Health Maintenance:     Health Maintenance Summary w/Most Recent Date       Status Date      Lipid Screening Overdue 10/16/2000  Zoster Vaccines Overdue 10/16/2010     Influenza Vaccine Next Due 07/01/2019     Mammogram Start Age 48 Next Due 07/16/2020      Done 02/24/2011 Ext Proc: CHG MAMMOGRAM, SCREENING     Done 07/16/2018 HM MAMMOGRAPHY    Pap Smear (21-65) Next Due 06/26/2021      Done 06/26/2018 Ext Metric: [id number] Last Pap smear date - External    Colonoscopy Next Due 01/20/2022      Done 01/21/2012 Ext Proc: ENDOSCOPY, COLON     Done 01/18/2012 HM COLONOSCOPY    DTaP/Tdap/Td Vaccines Next Due 02/21/2026      Done 02/22/2016 Imm Admin: TdaP     Done 01/15/2003 Imm Admin: Tetanus and diptheria,(adult), adsorbed, 2Lf tetanus toxoid, PF    Hepatitis C Screen This plan is no longer active.      Done 03/14/2019 HM HEPATITIS C SCREENING Hepatitis C Ab                Immunizations:     Immunization History   Administered Date(s) Administered   ??? TdaP 02/22/2016   ??? Tetanus and diptheria,(adult), adsorbed, 2Lf tetanus toxoid, PF 01/15/2003       I have reviewed and (if needed) updated the patient's problem list, medications, allergies, past medical and surgical history, social and family history.    ROS:      ROS  Comprehensive 10 point ROS negative unless otherwise stated in the HPI.       Vital Signs:     Wt Readings from Last 3 Encounters:   04/30/19 (!) 104.8 kg (231 lb)   03/27/19 (!) 112.8 kg (248 lb 11.2 oz)   09/18/18 (!) 114.9 kg (253 lb 6.4 oz)     Temp Readings from Last 3 Encounters:   04/30/19 36.9 ??C (98.5 ??F) (Axillary)   03/27/19 37 ??C (98.6 ??F) (Oral)   09/18/18 36.7 ??C (98.1 ??F)     BP Readings from Last 3 Encounters:   04/30/19 122/80   03/27/19 129/64   09/18/18 155/90     Pulse Readings from Last 3 Encounters:   04/30/19 76   03/27/19 76   09/18/18 81     Estimated body mass index is 38.44 kg/m?? as calculated from the following:    Height as of 03/27/19: 165.1 cm (5' 5).    Weight as of this encounter: 104.8 kg (231 lb).  Facility age limit for growth percentiles is 20 years.        Objective:      General: Alert and oriented x3. Well-appearing. No acute distress.   HEENT:  Normocephalic.  Atraumatic. Conjunctiva and sclera normal. OP MMM without lesions.   Neck:  Supple. No thyroid enlargement. No adenopathy.   Heart:  Regular rate and rhythm . Normal S1, S2.  No murmurs, rubs or gallops.   Lungs:  No respiratory distress. Lungs clear to auscultation. No wheezes, rhonchi, or rales.   Skin:  Warm, dry. No rash or lesions present.   Neuro:  Non-focal. No obvious weakness.   Psych:  Affect normal, eye contact good, speech clear and coherent.       I attest that I, Bayard Hugger, personally documented this note while acting as scribe for Noralyn Pick, FNP.      Bayard Hugger, Scribe.  04/30/2019     The documentation recorded by the scribe accurately reflects the service I personally performed and the decisions made by me.    Noralyn Pick, FNP

## 2019-04-30 ENCOUNTER — Encounter: Admit: 2019-04-30 | Discharge: 2019-05-01 | Payer: PRIVATE HEALTH INSURANCE | Attending: Family | Primary: Family

## 2019-04-30 DIAGNOSIS — M064 Inflammatory polyarthropathy: Secondary | ICD-10-CM

## 2019-04-30 DIAGNOSIS — Z8639 Personal history of other endocrine, nutritional and metabolic disease: Secondary | ICD-10-CM

## 2019-04-30 DIAGNOSIS — M3501 Sicca syndrome with keratoconjunctivitis: Secondary | ICD-10-CM

## 2019-04-30 DIAGNOSIS — Z0000 Encounter for general adult medical examination without abnormal findings: Principal | ICD-10-CM

## 2019-04-30 LAB — HEPATITIS C ANTIBODY: Lab: 0

## 2019-04-30 LAB — LIPID PANEL
CHOLESTEROL: 160 mg/dL
LDL CHOLESTEROL CALCULATED: 93 mg/dL
NON-HDL CHOLESTEROL: 112 mg/dL

## 2019-04-30 LAB — NON-HDL CHOLESTEROL: Lab: 112

## 2019-05-01 MED FILL — HUMIRA PEN CITRATE FREE 40 MG/0.4 ML: 28 days supply | Qty: 4 | Fill #0 | Status: AC

## 2019-05-07 NOTE — Unmapped (Signed)
Alexandra Peterson wants to know if she needs to schedule an appointment to come see you per the MyChart message below.

## 2019-05-20 LAB — POTASSIUM: Lab: 4.2

## 2019-05-20 LAB — CREATININE: Lab: 0.65

## 2019-05-22 MED ORDER — ADALIMUMAB 80 MG/0.8 ML SUBCUTANEOUS PEN KIT
0 refills | 0 days | Status: CP
Start: 2019-05-22 — End: ?
  Filled 2019-06-19: qty 3, 28d supply, fill #0

## 2019-05-26 NOTE — Unmapped (Signed)
Office notes scanned into the media from [facility]

## 2019-05-27 NOTE — Unmapped (Signed)
Patient will do a loading dose once PA approved, Meghan will call for clinical.

## 2019-05-29 NOTE — Unmapped (Signed)
Waiting for starter-pack approval (approved, but doesn't work on pharmacy side), and patient out of medication. We will plan to ship 2 pens (14 day supply) to her tomorrow, then plan to follow back up when starter-pack is covered.   [facility] Refill Coordination Note    Specialty Medication(s) to be Shipped:   Inflammatory Disorders: Humira    Other medication(s) to be shipped: na     Alexandra Peterson, DOB: 02/05/1961  Phone: [phone] (home)       All above HIPAA information was verified with patient.     Completed refill call assessment today to schedule patient's medication shipment from the [facility] [phone]).       Specialty medication(s) and dose(s) confirmed: Regimen is correct and unchanged.   Changes to medications: Alexandra Peterson reports no changes at this time.  Changes to insurance: No  Questions for the pharmacist: No    Confirmed patient received Welcome Packet with first shipment. The patient will receive a drug information handout for each medication shipped and additional FDA Medication Guides as required.       DISEASE/MEDICATION-SPECIFIC INFORMATION        For patients on injectable medications: Patient currently has 0 doses left.  Next injection is scheduled for tomorrow.    SPECIALTY MEDICATION ADHERENCE     Medication Adherence    Patient reported X missed doses in the last month: 0  Specialty Medication: Humira                Humira - 0 left      SHIPPING     Shipping address confirmed in Epic.     Delivery Scheduled: Yes, Expected medication delivery date: Friday, July 31.     Medication will be delivered via Same Day Courier to the home address in [place].    Alexandra Peterson Shared [facility] Specialty Pharmacist

## 2019-05-30 MED FILL — HUMIRA PEN CITRATE FREE 40 MG/0.4 ML: 14 days supply | Qty: 2 | Fill #1 | Status: AC

## 2019-05-30 MED FILL — HUMIRA PEN CITRATE FREE 40 MG/0.4 ML: 14 days supply | Qty: 2 | Fill #1

## 2019-06-09 NOTE — Unmapped (Signed)
Assessment and Plan:     [name] was seen today for annual exam.    Diagnoses and all orders for this visit:    Annual physical exam  Discussed healthy behaviors including well balanced diet and regular exercise. Commended patient on recent weight loss.     Hair loss  Recomend use of otc biotin supplement.     Oral fistula  Advised to discuss with dermatology if antibiotic is needed with patient being on Humira. May also need to return to dentist for follow up of fistula.     Medication adherence and barriers to the treatment plan have been addressed. Opportunities to optimize healthy behaviors have been discussed. Patient / caregiver voiced understanding.    Subjective:      Alexandra Peterson 58 y.o.female   is being seen for a health maintenance and gynecologic exam.  Last pap smear was 1 year ago with a normal pap and negative HPV.  Mammogram completed 07/21/19 - normal.   Colonoscopy - completed in 2013, normal by pt report. Due in 2023.  No LMP recorded. Patient is postmenopausal.    Chief Complaint   Patient presents with   ??? Annual Exam     Lifestyle:  Employment: self employed - weight loss coach  Diet: weight loss program - 5 meal bars daily  Exercise: swimming occasionally; trying to start walking more  Weight concerns: yes; following weight loss program  Last Dentist: every 6 months  Last Vision: >1 year; hx of floaters, cataracts    HS: Patient notes she is currently experiencing outbreak of HS in right axilla region. She is following with dermatology. Specialist plans to initiate Humira starter pack next week.   Patient notes several moles/freckles as well as SK. She had not had full body skin exam recently - intends to discuss with dermatologist next visit.     She reports recurrent fistula to left upper gum. Denies drainage. She states infection has come and gone for several years, however it has not been active for the past year until now. She sees dentist every 6 months, who recommended mouthwash. She has been using mouthwash regularly.     She reports recent hair loss. She states she experienced hair loss when she lost weight rapidly in the past as well.   Patient has continued weight loss program - chart review shows she is down 15 lbs since last visit 6 weeks ago (04/30/19). She notes she has lost 39 lbs since starting program 02/09/19.     Patient has continued following with rheumatology for Sjogren's syndrome and inflammatory arthritis - last visit 05/20/19. She has continued meloxicam 15 mg daily. Patient brought results of recent blood work completed at specialty clinic. Will scan into chart (see media tab).     HM:   Due for zoster vaccine. She had chicken pox as a child.      Social History:     Social History     Socioeconomic History   ??? Marital status: Divorced     Spouse name: None   ??? Number of children: None   ??? Years of education: None   ??? Highest education level: None   Occupational History   ??? None   Social Needs   ??? Financial resource strain: None   ??? Food insecurity     Worry: None     Inability: None   ??? Transportation needs     Medical: None     Non-medical: None   Tobacco Use   ???  Smoking status: Never Smoker   ??? Smokeless tobacco: Never Used   Substance and Sexual Activity   ??? Alcohol use: Never     Frequency: Never   ??? Drug use: Never   ??? Sexual activity: None   Lifestyle   ??? Physical activity     Days per week: None     Minutes per session: None   ??? Stress: None   Relationships   ??? Social Wellsite geologist on phone: None     Gets together: None     Attends religious service: None     Active member of club or organization: None     Attends meetings of clubs or organizations: None     Relationship status: None   Other Topics Concern   ??? Exercise No   ??? Living Situation Yes   ??? Do you use sunscreen? Yes   ??? Tanning bed use? No   ??? Are you easily burned? No   ??? Excessive sun exposure? No   ??? Blistering sunburns? No   Social History Narrative   ??? None       Past Medical/Surgical History: Past Medical History:   Diagnosis Date   ??? Arthritis    ??? Disorder of skin or subcutaneous tissue    ??? H/O seasonal allergies    ??? Hidradenitis    ??? Sjogren's disease (CMS-HCC)      Past Surgical History:   Procedure Laterality Date   ??? CESAREAN SECTION      1995   ??? cyst removed      1981   ??? HYSTEROSCOPY      2014   ??? KNEE ARTHROSCOPY W/ ACL RECONSTRUCTION      1993   ??? lumbar surgeries          Family History:     Family History   Problem Relation Age of Onset   ??? Aneurysm Mother    ??? Hypertension Mother    ??? Osteoporosis Mother    ??? Cancer Father    ??? Melanoma Brother    ??? Stroke Brother    ??? Atrial fibrillation Brother    ??? Hypertension Brother    ??? Hypertension Sister    ??? Basal cell carcinoma Neg Hx    ??? Squamous cell carcinoma Neg Hx        Allergies:     Sulfamethoxazole-trimethoprim, Adhesive, Bupropion hcl, Celecoxib, Other, Sertraline hcl, and Adhesive tape-silicones    Current Medications:     Current Outpatient Medications   Medication Sig Dispense Refill   ??? adalimumab 80 mg/0.8 mL PnKt For repeat loading dose: Inject the contents of 2 pens (160mg ) under the skin on day 1, then inject the contents of 1 pen (80mg ) on day 15. 3 each 0   ??? ADALIMUMAB PEN CITRATE FREE 40 MG/0.4 ML Inject the contents of 1 pen (40mg ) under the skin once weekly. 4 each 2   ??? fluticasone (FLONASE SENSIMIST) 27.5 mcg/actuation nasal spray 1 spray daily.      ??? fluticasone propionate (FLONASE) 50 mcg/actuation nasal spray into each nostril.     ??? meloxicam (MOBIC) 15 MG tablet Take 15 mg by mouth daily.      ??? multivitamin with minerals (HAIR,SKIN AND NAILS) tablet Take 1 tablet by mouth daily.       No current facility-administered medications for this visit.        Health Maintenance:     Health Maintenance Summary w/Most Recent  Date       Status Date      Zoster Vaccines Overdue 10/16/2010     Influenza Vaccine Next Due 07/01/2019     Mammogram Start Age 46 Next Due 07/16/2020      Done 02/24/2011 Ext Proc: CHG MAMMOGRAM, SCREENING     Done 07/16/2018 HM MAMMOGRAPHY    Pap Smear (21-65) Next Due 06/26/2021      Done 06/26/2018 Ext Metric: [id number] Last Pap smear date - External    Colonoscopy Next Due 01/20/2022      Done 01/21/2012 Ext Proc: ENDOSCOPY, COLON     Done 01/18/2012 HM COLONOSCOPY    Lipid Screening Next Due 04/29/2024      Done 04/30/2019 Smart[facility]: [facility] TOTAL CHOLESTEROL AND HDL COMPLETE     Done 05/22/2018 LIPID PANEL    DTaP/Tdap/Td Vaccines Next Due 02/21/2026      Done 02/22/2016 Imm Admin: TdaP     Done 01/15/2003 Imm Admin: Tetanus and diptheria,(adult), adsorbed, 2Lf tetanus toxoid, PF    Hepatitis C Screen This plan is no longer active.      Done 03/14/2019 HM HEPATITIS C SCREENING Hepatitis C Ab                Immunizations:     Immunization History   Administered Date(s) Administered   ??? TdaP 02/22/2016   ??? Tetanus and diptheria,(adult), adsorbed, 2Lf tetanus toxoid, PF 01/15/2003       I have reviewed and (if needed) updated the patient's problem list, medications, allergies, past medical and surgical history, preventive services, and social and family history.     ROS:   Gen:  No fever, excessive fatigue, weight loss or gain  EYES: No eye pain,eyesight problems,eye discharge   ENT:  No chronic sore throat, ear pain, hearing loss, hoarseness, nosebleeds; positive for fistula  CV: No chest pain, palpitations, claudication, edema  RESP: No SOB, chronic cough,wheezing ,DOE, orthopnea, PND  GI: No abdominal pain, swallowing problems, heartburn, nausea, constipation,       diarrhea, rectal bleeding,  GU: No dysuria, incontinence, abnormal vaginal bleeding, unusual vaginal d/c, pelvic pain, breast pain or masses  MS: No arthralgias, myalgias, joint swelling  SKIN:  No rashes, abnormal bruising or bleeding; positive for seborrheic keratosis, HS outbreak to right axilla; hair loss  PSYCH: No sleep problems, anxiety, depression, abnormal thoughts,              NEURO: No chronic headaches, numbness, confusion, difficulty walking, dizziness  Reproductive health:  Period Regularity: pt postmenopausal  Contraception need: pt postmenopausal    Vital Signs:     Wt Readings from Last 3 Encounters:   06/11/19 98 kg (216 lb)   04/30/19 (!) 104.8 kg (231 lb)   03/27/19 (!) 112.8 kg (248 lb 11.2 oz)     Temp Readings from Last 3 Encounters:   06/11/19 36.9 ??C (98.4 ??F) (Oral)   04/30/19 36.9 ??C (98.5 ??F) (Axillary)   03/27/19 37 ??C (98.6 ??F) (Oral)     BP Readings from Last 3 Encounters:   06/11/19 120/80   04/30/19 122/80   03/27/19 129/64     Pulse Readings from Last 3 Encounters:   06/11/19 71   04/30/19 76   03/27/19 76      Estimated body mass index is 35.94 kg/m?? as calculated from the following:    Height as of 03/27/19: 165.1 cm (5' 5).    Weight as of this encounter: 98 kg (216 lb).  Facility age  limit for growth percentiles is 20 years.        Objective:      General Appearance: Alert, cooperative, no distress, appears stated age.   Head and Neck: Normocephalic, atraumatic. No  masses. No thyromegaly. No bruits.   EENT: Eyes: PERRLA, Conjuntiva clear; Ears: bilateral TMs normal; Nose: septum and turbinates unremarkable; Throat: mucus membranes moist. No tonsilar enlargement or exudates. No OP erythema. Small fistula to left upper gum with moderate erythema, no drainage.   Cardiovascular:  Regular rate and rhythm. Normal S1, S2. No murmurs, rubs or gallops.  Respiratory: Lungs clear to auscultation bilaterally,  Respiratory effort unremarkable. No wheezes or rhonchi.   Breast: deferred  Gastrointestinal: Abdomen soft and non-tender. + BS, non-distended. No organomegaly or masses.  Rectal tone normal, no masses or bleeding. Stool negative for occult blood.  Genitourinary: deferred  MSK: Gait and station unremarkable. Normal ROM major joints. Normal strength and tone of proximal muscles.   Extremities :No edema.  Peripheral pulses normal  Integumentary: Warm and dry.No rashes.  No abnormal appearing skin lesions. Diffuse small moles and freckles on arms and forearms, back. Few scattered SK lesions to back.   LYMPH NODES: Cervical, supraclavicular, and axillary nodes normal  Neurologic: Alert and oriented to place and time. Normal deep tendon reflexes. CN II-XII grossly intact.   Psychiatric: Mood and affect appropriate for situation.      Labs:     No results found for this visit on 06/11/19.    I attest that I, Bayard Hugger, personally documented this note while acting as scribe for Noralyn Pick, FNP.      Bayard Hugger, Scribe.  06/11/2019     The documentation recorded by the scribe accurately reflects the service I personally performed and the decisions made by me.    Noralyn Pick, FNP

## 2019-06-10 NOTE — Unmapped (Signed)
Alexandra Peterson's insurance will cover the starter pack as early as 8/16. We'll plan to send one (14 day) pack of Humira so she has medicine for Friday, 8/14, then send starter pack for her to use the following Friday. She is aware of plan, and what to use when.    We'll use same day courier for 14-day pack on 8/13, then starter pack on 8/20.    [facility] Refill Coordination Note    Specialty Medication(s) to be Shipped:   Inflammatory Disorders: Humira    Other medication(s) to be shipped: na     Alexandra Peterson, DOB: 1961-02-08  Phone: [phone] (home)       All above HIPAA information was verified with patient.     Completed refill call assessment today to schedule patient's medication shipment from the [facility] [phone]).       Specialty medication(s) and dose(s) confirmed: see above   Changes to medications: Teale reports no changes at this time.  Changes to insurance: No  Questions for the pharmacist: No    Confirmed patient received Welcome Packet with first shipment. The patient will receive a drug information handout for each medication shipped and additional FDA Medication Guides as required.       DISEASE/MEDICATION-SPECIFIC INFORMATION        For patients on injectable medications: Patient currently has 0 doses left.  Next injection is scheduled for Friday, Aug 14.    SPECIALTY MEDICATION ADHERENCE        Humira - 0 left      SHIPPING     Shipping address confirmed in Epic.     Delivery Scheduled: Yes, Expected medication delivery date: Thurs, Aug 13 for maintenance, Thurs, Aug 20 for loading.     Medication will be delivered via Same Day Courier to the home address in [employer] [place].    Alexandra Peterson A Alexandra Peterson Shared [facility] Specialty Pharmacist

## 2019-06-11 ENCOUNTER — Encounter: Admit: 2019-06-11 | Discharge: 2019-06-12 | Payer: PRIVATE HEALTH INSURANCE | Attending: Family | Primary: Family

## 2019-06-11 DIAGNOSIS — L659 Nonscarring hair loss, unspecified: Secondary | ICD-10-CM

## 2019-06-11 DIAGNOSIS — K122 Cellulitis and abscess of mouth: Secondary | ICD-10-CM

## 2019-06-11 DIAGNOSIS — Z0000 Encounter for general adult medical examination without abnormal findings: Principal | ICD-10-CM

## 2019-06-11 NOTE — Unmapped (Signed)
Patient Education        Well Visit, Women 50 to 45: Care Instructions  Your Care Instructions     Physical exams can help you stay healthy. Your doctor has checked your overall health and may have suggested ways to take good care of yourself. He or she also may have recommended tests. At home, you can help prevent illness with healthy eating, regular exercise, and other steps.  Follow-up care is a key part of your treatment and safety. Be sure to make and go to all appointments, and call your doctor if you are having problems. It's also a good idea to know your test results and keep a list of the medicines you take.  How can you care for yourself at home?  ?? Reach and stay at a healthy weight. This will lower your risk for many problems, such as obesity, diabetes, heart disease, and high blood pressure.  ?? Get at least 30 minutes of exercise on most days of the week. Walking is a good choice. You also may want to do other activities, such as running, swimming, cycling, or playing tennis or team sports.  ?? Do not smoke. Smoking can make health problems worse. If you need help quitting, talk to your doctor about stop-smoking programs and medicines. These can increase your chances of quitting for good.  ?? Protect your skin from too much sun. When you're outdoors from 10 a.m. to 4 p.m., stay in the shade or cover up with clothing and a hat with a wide brim. Wear sunglasses that block UV rays. Even when it's cloudy, put broad-spectrum sunscreen (SPF 30 or higher) on any exposed skin.  ?? See a dentist one or two times a year for checkups and to have your teeth cleaned.  ?? Wear a seat belt in the car.  Follow your doctor's advice about when to have certain tests. These tests can spot problems early.  ?? Cholesterol. Your doctor will tell you how often to have this done based on your age, family history, or other things that can increase your risk for heart attack and stroke.  ?? Blood pressure. Have your blood pressure checked during a routine doctor visit. Your doctor will tell you how often to check your blood pressure based on your age, your blood pressure results, and other factors.  ?? Mammogram. Ask your doctor how often you should have a mammogram, which is an X-ray of your breasts. A mammogram can spot breast cancer before it can be felt and when it is easiest to treat.  ?? Pap test and pelvic exam. Ask your doctor how often you should have a Pap test. You may not need to have a Pap test as often as you used to.  ?? Vision. Have your eyes checked every year or two or as often as your doctor suggests. Some experts recommend that you have yearly exams for glaucoma and other age-related eye problems starting at age 58.  ?? Hearing. Tell your doctor if you notice any change in your hearing. You can have tests to find out how well you hear.  ?? Diabetes. Ask your doctor whether you should have tests for diabetes.  ?? Colorectal cancer. Your risk for colorectal cancer gets higher as you get older. Some experts say that adults should start regular screening at age 54 and stop at age 47. Others say to start before age 44 or continue after age 59. Talk with your doctor about your risk and when  to start and stop screening.  ?? Thyroid disease. Talk to your doctor about whether to have your thyroid checked as part of a regular physical exam. Women have an increased chance of a thyroid problem.  ?? Osteoporosis. You should begin tests for bone density at age 25. If you are younger than 48, ask your doctor whether you have factors that may increase your risk for this disease. You may want to have this test before age 42.  ?? Heart attack and stroke risk. At least every 4 to 6 years, you should have your risk for heart attack and stroke assessed. Your doctor uses factors such as your age, blood pressure, cholesterol, and whether you smoke or have diabetes to show what your risk for a heart attack or stroke is over the next 10 years.  When should you call for help?  Watch closely for changes in your health, and be sure to contact your doctor if you have any problems or symptoms that concern you.  Where can you learn more?  Go to [facility] at https://myuncchart.org  [facility] under [employer]. Enter [phone] in the search box to learn more about Well Visit, Women 50 to 22: Care Instructions.  Current as of: June 20, 2018??????????????????????????????Content Version: 12.5  ?? 2006-2020 Healthwise, Incorporated.   Care instructions adapted under license by [facility]. If you have questions about a medical condition or this instruction, always ask your healthcare professional. Healthwise, Incorporated disclaims any warranty or liability for your use of this information.

## 2019-06-12 MED FILL — HUMIRA PEN CITRATE FREE 40 MG/0.4 ML: 14 days supply | Qty: 2 | Fill #2

## 2019-06-12 MED FILL — HUMIRA PEN CITRATE FREE 40 MG/0.4 ML: 14 days supply | Qty: 2 | Fill #2 | Status: AC

## 2019-06-12 NOTE — Unmapped (Signed)
[facility] Pharmacist Intervention    Type of intervention: Medication hold during infection    Medication: Humira    Problem: Matsuko called in to the pharmacy to ask if she should hold her Humira dose for a sudden dental infection (pain developed since yesterday). She has pain and some localized swelling, and has called her dentist for a potential antibiotic. Her rheumatologist (who does not prescribe her Humira) advised she should not use Humira if taking an antibiotic, but she wanted to know if we/ Dr. Janyth Contes felt the same way.     She is due for her Humira dose tomorrow.    Intervention: I advised taking an antibiotic is not a contraindication to using Humira, especially if it's a local infection. If there are systemic symptoms like fever or chills, we would hold her dose. However, we would like to see the infection resolving before having her take the next dose. I advised if she started antibiotic today, and if she was feeling better in ~ 72 hours, she could likely resume Humira dose. We discussed if she ends up needing extensive dental work, or if the infection is more widespread, she should let dermatology clinic know so they can make a more appropriate recommendation.      Follow up needed: I will tag dermatology clinic to see if they have a different recommendation.    Approximate time spent: 10 minutes    Athena Baltz A Desiree Lucy Shared [facility] Specialty Pharmacist

## 2019-06-12 NOTE — Unmapped (Signed)
Call from patient on nurse line.  States she has an infected tooth and is talking with her dentist.  Wants to know if she should hold Humira while taking an antibiotic.

## 2019-06-19 MED FILL — HUMIRA PEN CITRATE FREE STARTER PACK FOR CROHN'S/UC/HS 3 X 80 MG/0.8 ML: 28 days supply | Qty: 3 | Fill #0 | Status: AC

## 2019-07-03 NOTE — Unmapped (Signed)
Alexandra Peterson is having multiple instances of flare ups on her underarms and under her breasts.  She does admit to spending a day at the pool and noticed the breast irritation after that.  She is using deoderant and rubbing alcohol to keep areas dry and clean.  I advised not to use the alcohol on any open areas.  She will continue on the Humira and talk to at her appt on 09/21.  Will set up clinical afterwards to check in with her.      [facility] Clinical Assessment & Refill Coordination Note    Alexandra Peterson, DOB: 03-Oct-1961  Phone: [phone] (home)     All above HIPAA information was verified with patient.     Specialty Medication(s):   Inflammatory Disorders: Humira     Current Outpatient Medications   Medication Sig Dispense Refill   ??? adalimumab 80 mg/0.8 mL PnKt For repeat loading dose: Inject the contents of 2 pens (160mg ) under the skin on day 1, then inject the contents of 1 pen (80mg ) on day 15. 3 each 0   ??? ADALIMUMAB PEN CITRATE FREE 40 MG/0.4 ML Inject the contents of 1 pen (40mg ) under the skin once weekly. 4 each 2   ??? fluticasone (FLONASE SENSIMIST) 27.5 mcg/actuation nasal spray 1 spray daily.      ??? fluticasone propionate (FLONASE) 50 mcg/actuation nasal spray into each nostril.     ??? meloxicam (MOBIC) 15 MG tablet Take 15 mg by mouth daily.      ??? multivitamin with minerals (HAIR,SKIN AND NAILS) tablet Take 1 tablet by mouth daily.       No current facility-administered medications for this visit.         Changes to medications: Alexandra Peterson reports no changes at this time.    Allergies   Allergen Reactions   ??? Sulfamethoxazole-Trimethoprim Swelling   ??? Adhesive Rash   ??? Bupropion Hcl Other (See Comments)     REACTION: irritability  REACTION: irritability     ??? Celecoxib Other (See Comments)     REACTION: edema  REACTION: edema     ??? Other Other (See Comments)     REACTION: not effective   ??? Sertraline Hcl Other (See Comments)     REACTION: wt gain  REACTION: wt gain     ??? Adhesive Tape-Silicones Rash       Changes to allergies: No    SPECIALTY MEDICATION ADHERENCE     Humira CF 40/0.4 mg/ml: 7 days of medicine on hand            Specialty medication(s) dose(s) confirmed: Regimen is correct and unchanged.     Are there any concerns with adherence? No    Adherence counseling provided? Not needed    CLINICAL MANAGEMENT AND INTERVENTION      Clinical Benefit Assessment:    Do you feel the medicine is effective or helping your condition? No    Clinical Benefit counseling provided? Yes.  She is still not seeing improvement after restarting the Humira.  I reitarated this was only the 3 dose and it will take longer to see full effect.      Adverse Effects Assessment:    Are you experiencing any side effects? Yes, patient reports experiencing irritation under breast area and underarms.  She is using a bandaid and using alcohol to keep areas dry.  . Side effect counseling provided: yes    Are you experiencing difficulty administering your medicine? No  Quality of Life Assessment:    How many days over the past month did your Hidradenitis  keep you from your normal activities? For example, brushing your teeth or getting up in the morning. 0    Have you discussed this with your provider? Not needed    Therapy Appropriateness:    Is therapy appropriate? Pharmacist will consult provider    DISEASE/MEDICATION-SPECIFIC INFORMATION      For patients on injectable medications: Patient currently has 1 doses left.  Next injection is scheduled for 07/04/19.    PATIENT SPECIFIC NEEDS     ? Does the patient have any physical, cognitive, or cultural barriers? No    ? Is the patient high risk? No     ? Does the patient require a Care Management Plan? No     ? Does the patient require physician intervention or other additional services (i.e. nutrition, smoking cessation, social work)? No      SHIPPING     Specialty Medication(s) to be Shipped:   Inflammatory Disorders: Humira    Other medication(s) to be shipped: n/a     Changes to insurance: No    Delivery Scheduled: Yes, Expected medication delivery date: 07/11/19.     Medication will be delivered via Same Day Courier to the confirmed home address in [facility].    The patient will receive a drug information handout for each medication shipped and additional FDA Medication Guides as required.  Verified that patient has previously received a Conservation officer, historic buildings.    All of the patient's questions and concerns have been addressed.    Alexandra Peterson   [facility] Shared [facility] Specialty Pharmacist

## 2019-07-11 MED FILL — HUMIRA PEN CITRATE FREE 40 MG/0.4 ML: 28 days supply | Qty: 4 | Fill #3 | Status: AC

## 2019-07-11 MED FILL — HUMIRA PEN CITRATE FREE 40 MG/0.4 ML: 28 days supply | Qty: 4 | Fill #3

## 2019-07-21 ENCOUNTER — Ambulatory Visit: Admit: 2019-07-21 | Discharge: 2019-07-22 | Payer: PRIVATE HEALTH INSURANCE

## 2019-07-21 DIAGNOSIS — L910 Hypertrophic scar: Secondary | ICD-10-CM

## 2019-07-21 DIAGNOSIS — L732 Hidradenitis suppurativa: Secondary | ICD-10-CM

## 2019-07-21 DIAGNOSIS — Z79899 Other long term (current) drug therapy: Secondary | ICD-10-CM

## 2019-07-21 DIAGNOSIS — M3501 Sicca syndrome with keratoconjunctivitis: Secondary | ICD-10-CM

## 2019-07-21 LAB — COMPREHENSIVE METABOLIC PANEL
ALKALINE PHOSPHATASE: 78 U/L (ref 38–126)
ALT (SGPT): 31 U/L (ref <35)
AST (SGOT): 39 U/L — ABNORMAL HIGH (ref 14–38)
BILIRUBIN TOTAL: 0.9 mg/dL (ref 0.0–1.2)
BLOOD UREA NITROGEN: 15 mg/dL (ref 7–21)
BUN / CREAT RATIO: 28
CALCIUM: 10.1 mg/dL (ref 8.5–10.2)
CHLORIDE: 105 mmol/L (ref 98–107)
CO2: 30 mmol/L (ref 22.0–30.0)
CREATININE: 0.53 mg/dL — ABNORMAL LOW (ref 0.60–1.00)
EGFR CKD-EPI AA FEMALE: >90 mL/min/{1.73_m2} (ref >=60)
EGFR CKD-EPI NON-AA FEMALE: >90 mL/min/{1.73_m2} (ref >=60)
GLUCOSE RANDOM: 105 mg/dL (ref 70–179)
POTASSIUM: 4.6 mmol/L (ref 3.5–5.0)
PROTEIN TOTAL: 8.4 g/dL — ABNORMAL HIGH (ref 6.5–8.3)
SODIUM: 147 mmol/L — ABNORMAL HIGH (ref 135–145)

## 2019-07-21 LAB — LIPID PANEL
CHOLESTEROL/HDL RATIO SCREEN: 3.5
HDL CHOLESTEROL: 48 mg/dL (ref 40–59)
NON-HDL CHOLESTEROL: 121 mg/dL
TRIGLYCERIDES: 97 mg/dL (ref 1–149)
VLDL CHOLESTEROL CAL: 19.4 mg/dL

## 2019-07-21 LAB — FASTING

## 2019-07-21 LAB — EGFR CKD-EPI NON-AA FEMALE: Lab: >90

## 2019-07-21 NOTE — Unmapped (Signed)
Patient Education        Hidradenitis Suppurativa: Care Instructions  Your Care Instructions     Hidradenitis suppurativa (say hih-drad-uh-NY-tus sup-yur-uh-TY-vuh) is a skin condition that causes lumps on the skin that look like pimples or boils. The lumps are usually painful and can break open and drain blood and bad-smelling pus. The condition can come and go for many years.  Treatment for this condition may include antibiotics and other medicines. You may need surgery to remove the lumps. Home care includes wearing loose-fitting clothes and washing the area gently. You can help prevent lumps from coming back by staying at a healthy weight and not smoking.  Doctors don't know exactly how this condition starts. But they do know that something irritates and inflames the hair follicles, causing them to swell and form lumps. This skin condition can't be spread from person to person (isn't contagious).  Follow-up care is a key part of your treatment and safety. Be sure to make and go to all appointments, and call your doctor if you are having problems. It's also a good idea to know your test results and keep a list of the medicines you take.  How can you care for yourself at home?  Skin care  ?? ?? Wash the area every day with mild soap. Use your hands rather than a washcloth or sponge when you wash that part of your body.   ?? ?? Leave the affected areas uncovered when you can. If you have lumps that are draining, you can cover them with a bandage or other dressing. Put petroleum jelly (such as Vaseline) on the dressing to help keep it from sticking.   ?? ?? Wear-loose fitting clothes that don't rub against the area. Avoid activities that cause skin to rub together.   ?? ?? If you have pain, try a warm compress. Soak a towel or washcloth in warm water, wring it out, and place it on the affected skin for about 10 minutes.   Medicines  ?? ?? Be safe with medicines. Take your medicines exactly as prescribed. Call your doctor if you think you are having a problem with your medicine. You will get more details on the specific medicines your doctor prescribes.   ?? ?? If your doctor prescribed antibiotics, take them as directed. Do not stop taking them just because you feel better. You need to take the full course of antibiotics.   Lifestyle choices  ?? ?? If you smoke, think about quitting. Smoking can make the condition worse. If you need help quitting, talk to your doctor about stop-smoking programs and medicines. These can increase your chances of quitting for good.   ?? ?? Stay at a healthy weight, or lose weight, by eating healthy foods and being physically active. Being overweight could make this condition worse.   When should you call for help?   Call your doctor now or seek immediate medical care if:  ?? ?? You have symptoms of infection, such as:  ? Increased pain, swelling, warmth, or redness.  ? Red streaks leading from the area.  ? Pus draining from the area.  ? A fever.   Watch closely for changes in your health, and be sure to contact your doctor if:  ?? ?? You do not get better as expected.   Where can you learn more?  Go to [facility] at https://myuncchart.org  Select Patient Education under [employer]. Enter [phone] in the search box to learn more about Hidradenitis  Suppurativa: Care Instructions.  Current as of: May 01, 2019??????????????????????????????Content Version: 12.6  ?? 2006-2020 Healthwise, Incorporated.   Care instructions adapted under license by [facility]. If you have questions about a medical condition or this instruction, always ask your healthcare professional. Healthwise, Incorporated disclaims any warranty or liability for your use of this information.

## 2019-07-21 NOTE — Unmapped (Signed)
Impression/ Plan:  Hidradenitis suppurativa and arthritis.  -Continue adalimumab 40mg  weekly, side effects reviewed  -most recent LFTs were excellent and look good on repeat today.  She has been on adalimumab for he last few months so it is unlikely that it caused her LFT elevation  -One recent flare in context of missed adalimumab doses, overall improving with restart    - The patient was also advised to send a MyChart message or call for an appointment should any new, changing, or symptomatic lesions/rash develop in the interim.     RV: 3 m  ________________________________________________________________________________________________________    Ref Provider: Loran Senters      CC/HPI:    Alexandra Peterson is a friendly 58 y.o. female , a Return  patient for a ESTABLISHED problem, evaluated for:       Chief Complaint   Patient presents with   ??? HS     new flare-ups R arm, has calm down, tx: humira      Restarted Humira recently for her hidradenitis suppurativa and joint aches after having to stop due to LFT elevations.  Has noted improvement again since restarting, though did have a flare with some missed doses due to a tooth infection.  Her joint work-up has mostly been unremarkable other than serology somewhat concerning for Sjogren's syndrome.  She does endorse worse pain and stiffness in the mornings as well as what sounds like a description of dactylitis.  She has been on many antibiotics in the past, most recently clindamycin rifampin, but cannot tolerate them due to headaches.       Hep B core Ab was pos back in 12/19, but was repeated and is neg now. ??  Hep B surface Ab negative.   Heb surface Ag neg.   ??HCV Ab neg. ??  HBV PCR neg.   ??CRB 26.   Quant gold neg.   ??CCP, anti-dsDNA, RF neg. ??   G6PD normal. ??  UA, CMET, CBC with diff normal.   ??Anti-SSA elevated at 6.2 (ULN 0.9).       Prior treatments:  Topical: hibiclens, clindamycin   Systemic: unsure of which antibiotics   Past surgical procedures: excision or unroofing of the left axilla, pilonidal cyst excision years ago    Past laser procedures: none    ROS:     The balance of 10 systems is negative unless otherwise documented.    PE:  OBJECTIVE:   Gen: Well-appearing patient, appropriate, interactive, in no acute distress  Skin: Examination of the scalp, face, neck, chest, back, abdomen, bilateral upper and lower extremities, hands, palms, soles, nails, buttocks, and external genitalia performed today and pertinent for:     location Abscess Inflamed nodule Non-inflamed nodule Draining sinus Non-draining Sinus Hurley % scar   R axilla  1 1  1      L axilla       2   R inframammary          L inframammary          Intermammary          Pubic     1     R inguinal          R thigh          L inguinal          L thigh          Scrotum/Vulva          Perianal  R buttock          L buttock          Other (list)                      AN count (total sum of abscess and inflammatory nodule): 1  Pilonidal sinus (Y/N, or previously treated)? excised previously  Approximate BSA involved by inflammatory lesions: 0.1  Intertriginous comedones: few  Diffuse comedones (trunk, face, etc): none  Acne scars: none  Cribriform scarring: No.  Intertrigionus epidermal inclusion cysts: none  Diffuse (trunk, feace, extremities) epidermal inclusion cysts: none

## 2019-08-05 NOTE — Unmapped (Signed)
I checked in with Alexandra Peterson, and she's doing OK on her Humira. She had a recent visit with dermatology. She continues to have some flares under her arm, but will continue on treatment for now. She has 2 injections left - for Friday the 9th and 16th. She wanted [place] to check back later for scheduling her refills.    [name] A. Katrinka Blazing, PharmD, BCPS - Pharmacist   [facility]

## 2019-08-14 DIAGNOSIS — L732 Hidradenitis suppurativa: Principal | ICD-10-CM

## 2019-08-14 MED ORDER — HUMIRA PEN CITRATE FREE 40 MG/0.4 ML: each | 2 refills | 0 days | Status: AC

## 2019-08-14 NOTE — Unmapped (Signed)
[facility] Refill Coordination Note    Specialty Medication(s) to be Shipped:   Inflammatory Disorders: Humira    Other medication(s) to be shipped: na     Bettyjo Lundblad, DOB: 1961-05-10  Phone: [phone] (home)       All above HIPAA information was verified with patient.     Completed refill call assessment today to schedule patient's medication shipment from the [facility] [phone]).       Specialty medication(s) and dose(s) confirmed: Regimen is correct and unchanged.   Changes to medications: Aleiah reports no changes at this time.  Changes to insurance: No  Questions for the pharmacist: No    Confirmed patient received Welcome Packet with first shipment. The patient will receive a drug information handout for each medication shipped and additional FDA Medication Guides as required.       DISEASE/MEDICATION-SPECIFIC INFORMATION        For patients on injectable medications: Patient currently has 1 doses left.  Next injection is scheduled for 101620.    SPECIALTY MEDICATION ADHERENCE     Medication Adherence    Patient reported X missed doses in the last month: 0  Specialty Medication: humira cf 40 mg/0.4 ml  Patient is on additional specialty medications: No  Any gaps in refill history greater than 2 weeks in the last 3 months: no  Demonstrates understanding of importance of adherence: yes  Informant: patient  Reliability of informant: reliable  Confirmed plan for next specialty medication refill: delivery by pharmacy  Refills needed for supportive medications: not needed                humira cf 40 mg/0.4 ml. 7 days on hand      SHIPPING     Shipping address confirmed in Epic.     Delivery Scheduled: Yes, Expected medication delivery date: 102120.  However, Rx request for refills was sent to the provider as there are none remaining.     Medication will be delivered via Same Day Courier to the home address in Epic WAM.    Jerin Franzel D Jabez Molner [facility] Shared [facility] Specialty Technician

## 2019-08-14 NOTE — Unmapped (Signed)
Last seen September 2020. Requesting humira refill. Pended.

## 2019-08-20 MED FILL — HUMIRA PEN CITRATE FREE 40 MG/0.4 ML: 28 days supply | Qty: 4 | Fill #0 | Status: AC

## 2019-08-20 MED FILL — HUMIRA PEN CITRATE FREE 40 MG/0.4 ML: SUBCUTANEOUS | 28 days supply | Qty: 4 | Fill #0

## 2019-09-11 NOTE — Unmapped (Signed)
[facility] Refill Coordination Note    Patient reports that she does not believe the Humira is working as she is having really bad flare ups and reports she has areas that are even bleeding. The patient stated that she has reached out to Dr Janyth Contes via e-mail and is waiting to hear back. She states that she will continue her Humira until Dr Janyth Contes states otherwise and she plans to schedule a follow up appointment soon, says she is due to go in December.    Specialty Medication(s) to be Shipped:   Inflammatory Disorders: Humira    Other medication(s) to be shipped: n/a     Alexandra Peterson, DOB: [birth year]  Phone: [phone] (home)       All above HIPAA information was verified with patient.     Completed refill call assessment today to schedule patient's medication shipment from the [facility] [phone]).       Specialty medication(s) and dose(s) confirmed: Regimen is correct and unchanged.   Changes to medications: Tashyra reports no changes at this time.  Changes to insurance: No  Questions for the pharmacist: No    Confirmed patient received Welcome Packet with first shipment. The patient will receive a drug information handout for each medication shipped and additional FDA Medication Guides as required.       DISEASE/MEDICATION-SPECIFIC INFORMATION        For patients on injectable medications: Patient currently has 1 doses left.  Next injection is scheduled for 09/12/2019.    SPECIALTY MEDICATION ADHERENCE     Medication Adherence    Patient reported X missed doses in the last month: 0  Specialty Medication: humira cf 40 mg/0.4 ml  Patient is on additional specialty medications: No  Any gaps in refill history greater than 2 weeks in the last 3 months: no  Demonstrates understanding of importance of adherence: yes  Informant: patient  Reliability of informant: reliable  Confirmed plan for next specialty medication refill: delivery by pharmacy Refills needed for supportive medications: not needed                humira cf 40 mg/0.4 ml. 7 days on hand      SHIPPING     Shipping address confirmed in Epic.     Delivery Scheduled: Yes, Expected medication delivery date: 09/16/2019.     Medication will be delivered via Same Day Courier to the prescription address in Epic WAM.    Jahir Halt D Nery Frappier   [facility] Shared [facility] Specialty Technician

## 2019-09-16 MED FILL — HUMIRA PEN CITRATE FREE 40 MG/0.4 ML: 28 days supply | Qty: 4 | Fill #1 | Status: AC

## 2019-09-16 MED FILL — HUMIRA PEN CITRATE FREE 40 MG/0.4 ML: SUBCUTANEOUS | 28 days supply | Qty: 4 | Fill #1

## 2019-09-22 ENCOUNTER — Ambulatory Visit: Admit: 2019-09-22 | Discharge: 2019-09-23 | Payer: PRIVATE HEALTH INSURANCE

## 2019-09-22 DIAGNOSIS — L910 Hypertrophic scar: Principal | ICD-10-CM

## 2019-09-22 DIAGNOSIS — Z79899 Other long term (current) drug therapy: Principal | ICD-10-CM

## 2019-09-22 DIAGNOSIS — L732 Hidradenitis suppurativa: Principal | ICD-10-CM

## 2019-09-22 DIAGNOSIS — L820 Inflamed seborrheic keratosis: Principal | ICD-10-CM

## 2019-09-22 LAB — CREATININE
EGFR CKD-EPI AA FEMALE: >90 mL/min/{1.73_m2} (ref >=60)
EGFR CKD-EPI NON-AA FEMALE: >90 mL/min/{1.73_m2} (ref >=60)

## 2019-09-22 LAB — CBC W/ AUTO DIFF
BASOPHILS ABSOLUTE COUNT: 0 10*9/L (ref 0.0–0.1)
BASOPHILS RELATIVE PERCENT: 0.7 %
EOSINOPHILS ABSOLUTE COUNT: 0.1 10*9/L (ref 0.0–0.4)
EOSINOPHILS RELATIVE PERCENT: 2 %
HEMOGLOBIN: 14.6 g/dL (ref 13.5–16.0)
LARGE UNSTAINED CELLS: 2 % (ref 0–4)
LYMPHOCYTES ABSOLUTE COUNT: 1.4 10*9/L — ABNORMAL LOW (ref 1.5–5.0)
LYMPHOCYTES RELATIVE PERCENT: 28.6 %
MEAN CORPUSCULAR HEMOGLOBIN CONC: 33.7 g/dL (ref 31.0–37.0)
MEAN CORPUSCULAR HEMOGLOBIN: 31.4 pg (ref 26.0–34.0)
MEAN CORPUSCULAR VOLUME: 93.4 fL (ref 80.0–100.0)
MEAN PLATELET VOLUME: 6.7 fL — ABNORMAL LOW (ref 7.0–10.0)
MONOCYTES ABSOLUTE COUNT: 0.3 10*9/L (ref 0.2–0.8)
NEUTROPHILS ABSOLUTE COUNT: 3.1 10*9/L (ref 2.0–7.5)
NEUTROPHILS RELATIVE PERCENT: 60.5 %
PLATELET COUNT: 276 10*9/L (ref 150–440)
RED BLOOD CELL COUNT: 4.65 10*12/L (ref 4.00–5.20)
RED CELL DISTRIBUTION WIDTH: 13.1 % (ref 12.0–15.0)

## 2019-09-22 LAB — BLOOD UREA NITROGEN: Urea nitrogen:MCnc:Pt:Ser/Plas:Qn:: 14

## 2019-09-22 LAB — AST (SGOT): Aspartate aminotransferase:CCnc:Pt:Ser/Plas:Qn:: 29

## 2019-09-22 LAB — EGFR CKD-EPI AA FEMALE: Lab: >90

## 2019-09-22 LAB — ALT (SGPT): Alanine aminotransferase:CCnc:Pt:Ser/Plas:Qn:: 15

## 2019-09-22 LAB — BASOPHILS RELATIVE PERCENT: Basophils/100 leukocytes:NFr:Pt:Bld:Qn:Automated count: 0.7

## 2019-09-22 MED ORDER — FOLIC ACID 1 MG TABLET
ORAL_TABLET | 3 refills | 0 days | Status: CP
Start: 2019-09-22 — End: ?

## 2019-09-22 MED ORDER — CLOBETASOL 0.05 % TOPICAL OINTMENT
Freq: Two times a day (BID) | TOPICAL | 3 refills | 0 days | Status: CP
Start: 2019-09-22 — End: 2020-09-21

## 2019-09-22 MED ORDER — METHOTREXATE SODIUM 2.5 MG TABLET
ORAL_TABLET | 3 refills | 0 days | Status: CP
Start: 2019-09-22 — End: ?

## 2019-09-22 NOTE — Unmapped (Addendum)
Patient Education     [email or web address]      Hidradenitis Suppurativa: Care Instructions  Your Care Instructions     Hidradenitis suppurativa (say hih-drad-uh-NY-tus sup-yur-uh-TY-vuh) is a skin condition that causes lumps on the skin that look like pimples or boils. The lumps are usually painful and can break open and drain blood and bad-smelling pus. The condition can come and go for many years.  Treatment for this condition may include antibiotics and other medicines. You may need surgery to remove the lumps. Home care includes wearing loose-fitting clothes and washing the area gently. You can help prevent lumps from coming back by staying at a healthy weight and not smoking.  Doctors don't know exactly how this condition starts. But they do know that something irritates and inflames the hair follicles, causing them to swell and form lumps. This skin condition can't be spread from person to person (isn't contagious).  Follow-up care is a key part of your treatment and safety. Be sure to make and go to all appointments, and call your doctor if you are having problems. It's also a good idea to know your test results and keep a list of the medicines you take.  How can you care for yourself at home?  Skin care  ?? ?? Wash the area every day with mild soap. Use your hands rather than a washcloth or sponge when you wash that part of your body.   ?? ?? Leave the affected areas uncovered when you can. If you have lumps that are draining, you can cover them with a bandage or other dressing. Put petroleum jelly (such as Vaseline) on the dressing to help keep it from sticking.   ?? ?? Wear-loose fitting clothes that don't rub against the area. Avoid activities that cause skin to rub together.   ?? ?? If you have pain, try a warm compress. Soak a towel or washcloth in warm water, wring it out, and place it on the affected skin for about 10 minutes.   Medicines ?? ?? Be safe with medicines. Take your medicines exactly as prescribed. Call your doctor if you think you are having a problem with your medicine. You will get more details on the specific medicines your doctor prescribes.   ?? ?? If your doctor prescribed antibiotics, take them as directed. Do not stop taking them just because you feel better. You need to take the full course of antibiotics.   Lifestyle choices  ?? ?? If you smoke, think about quitting. Smoking can make the condition worse. If you need help quitting, talk to your doctor about stop-smoking programs and medicines. These can increase your chances of quitting for good.   ?? ?? Stay at a healthy weight, or lose weight, by eating healthy foods and being physically active. Being overweight could make this condition worse.   When should you call for help?   Call your doctor now or seek immediate medical care if:  ?? ?? You have symptoms of infection, such as:  ? Increased pain, swelling, warmth, or redness.  ? Red streaks leading from the area.  ? Pus draining from the area.  ? A fever.   Watch closely for changes in your health, and be sure to contact your doctor if:  ?? ?? You do not get better as expected.   Where can you learn more?  Go to [facility] at https://myuncchart.org  Select Patient Education under [employer]. Enter [phone] in the search box to learn more  about Hidradenitis Suppurativa: Care Instructions.  Current as of: May 01, 2019??????????????????????????????Content Version: 12.6  ?? 2006-2020 Healthwise[employer].   Care instructions adapted under license by [facility]. If you have questions about a medical condition or this instruction, always ask your healthcare professional. Healthwise, Incorporated disclaims any warranty or liability for your use of this information.       Patient Education        Seborrheic Keratosis: Care Instructions  Your Care Instructions Seborrheic keratoses are raised skin growths that look scaly or warty. They usually look like they were stuck onto the skin. They most often grow in groups on the back or chest and are more common in older people. A seborrheic keratosis can be tan or dark brown. A seborrheic keratosis is not a mole and is almost always harmless. But it is still a good idea to check your skin regularly.  Sometimes a seborrheic keratosis can itch. Scratching it can cause it to bleed and sometimes even scar.  A seborrheic keratosis is removed only if it bothers you. The doctor will freeze it or scrape it off with a tool. The doctor can also use a laser to remove a seborrheic keratosis. Treatment usually results in normal-looking skin, but it can leave a light or dark mark or even a scar on the skin.  Follow-up care is a key part of your treatment and safety. Be sure to make and go to all appointments, and call your doctor if you are having problems. It's also a good idea to know your test results and keep a list of the medicines you take.  How can you care for yourself at home?  ?? If clothing irritates your seborrheic keratosis, cover it with a bandage to prevent rubbing and bleeding.  ?? If you have a seborrheic keratosis removed, clean the area with soap and water two times a day unless your doctor gives you different instructions. Don't use hydrogen peroxide or alcohol, which can slow healing.  ? You may cover the wound with a thin layer of petroleum jelly, such as Vaseline, and a nonstick bandage.  ?? If you see a change in a skin growth, contact your doctor. Look for:  ? A mole that bleeds.  ? A fast-growing mole.  ? A scaly or crusted growth on the skin.  ? A sore that will not heal.  When should you call for help?   Call your doctor now or seek immediate medical care if:  ?? ?? You have an area of normal skin that suddenly changes in shape, size, or how it looks.   ?? ?? Your skin is badly broken from scratching. ?? ?? You have signs of infection such as:  ? Pain, warmth, or swelling in your skin.  ? Red streaks near a wound in your skin.  ? Pus coming from a wound in your skin.  ? A fever not due to the flu or other illness.   Watch closely for changes in your health, and be sure to contact your doctor if:  ?? ?? You do not get better as expected.   Where can you learn more?  Go to [facility] at https://myuncchart.org  Select Patient Education under [employer]. Enter [phone] in the search box to learn more about Seborrheic Keratosis: Care Instructions.  Current as of: May 01, 2019??????????????????????????????Content Version: 12.6  ?? 2006-2020 Healthwise, Inc[employer].   Care instructions adapted under license by [facility]. If you have questions about a medical  condition or this instruction, always ask your healthcare professional. Healthwise, Incorporated disclaims any warranty or liability for your use of this information.         Cryosurgery  Cryosurgery (???freezing???) uses liquid nitrogen to destroy certain types of skin lesions. Lowering the temperature of the lesion in a small area surrounding skin destroys the lesion. Immediately following cryosurgery, you will notice redness and swelling of the treatment area. Blistering or weeping may occur, lasting approximately one week which will then be followed by crusting. Most areas will heal completely in 10 to 14 days.    Wash the treated areas daily. Allow soap and water to run over the areas, but do not scrub. Should a scab or crust form, allow it to fall off on its own. Do not remove or pick at it. Application of an ointment  and a bandage may make you feel more comfortable, but it is not necessary. Some people develop an allergy to Neosporin, so we recommend that Vaseline or  Aquaphor be used. The cryotherapy site will be more sensitive than your surrounding skin. Keep it covered, and remember to apply sunscreen every day to all your sun exposed skin. A scar may remain which is lighter or pinker than your normal skin. Your body will continue to improve your scar for up to one year; however a light-colored scar may remain.    Infection following cryotherapy is rare. However if you are worried about the appearance of the treated area, contact your doctor. We have a physician on call at all times. If you have any concerns about the site, please call our clinic at [phone]

## 2019-09-22 NOTE — Unmapped (Deleted)
Self-reported severity (0-5): 3  VAS pain today: 0  VAS average pain for the last month: 1  Requiring pain medication? No.  If so, what type/frequency? ***  How often in pain?  less than montly  Level of odor (0-5): 0  Level of itching (0-5): 0  Dressing changes needed for drainage:Once a day  How much drainage: a little drainage  Flare in the last month (Y/N)? {YES/NO:21013}  How long ago was the last flare? in last 6 months  Developing new lesions? once a month  Number of inflammatory lesions montly: 3-5  DLQI: ***  Current treatment: ***     How helpful is the current treatment in managing the following aspects of your disease?  Not at all helpful Somewhat helpful Very helpful   Pain      Decreasing length of flares x     Decreasing new lesions x     Drainage x x    Decreasing frequency of flares x     Decreasing severity of flares x     Odor n/a

## 2019-09-22 NOTE — Unmapped (Signed)
Impression/ Plan:  Hidradenitis suppurativa and arthritis.  -Continue adalimumab 40mg  weekly, side effects reviewed  -We discussed treatment with methotrexate.  We will start 15mg  weekly with 1mg  of folic acid daily.  We discussed the risks of methotrexate including but not limited to increased risk for infection, decreased blood counts, liver damage or failure, fatigue, and GI upset.  We let the patient know to contact us with significant new symptoms such as signs of infection or GI upset.  The patient expressed understanding and would like to proceed.  We will check baseline labs including CBC, LFTs, BUN/Cr.  We will notify the patient of results and that it is safe to pick up the prescription at that time.  -One recent flare in context of missed adalimumab doses, overall improving with restart    After verbal informed consent was obtained, each inflamed seborrheic keratosis was treated with a single freeze-thaw cycle of cryotherapy and post-cryotherapy care instructions were given.  Total number treated: L ear    - The patient was also advised to send a MyChart message or call for an appointment should any new, changing, or symptomatic lesions/rash develop in the interim.     RV: 3 m  ________________________________________________________________________________________________________    Ref Provider: Loran Senters      CC/HPI:    Alexandra Peterson is a friendly 58 y.o. female , a Return  patient for a ESTABLISHED problem, evaluated for:       Chief Complaint   Patient presents with   ??? HS     flare-ups armpits, no healing, stil using humira Restarted Humira in 05/2019 for her hidradenitis suppurativa and joint aches after having to stop due to LFT elevations. It improved somewhat with restart, but is not working as well as previously.  Tender areas today in axillae and groin.   LFTs have remained stable.  Her joint work-up has mostly been unremarkable other than serology somewhat concerning for Sjogren's syndrome.  She does endorse worse pain and stiffness in the mornings as well as what sounds like a description of dactylitis and has been told she mostly has osteoarthritis.  She has been on many antibiotics in the past, most recently clindamycin rifampin, but cannot tolerate them due to headaches. Also has crusty itchy spot in L ear at juncture of tragus and antitragus  Self-reported severity (0-5): 3   VAS pain today: 0   VAS average pain for the last month: 1   Requiring pain medication? No. If so, what type/frequency? no  How often in pain? less than montly   Level of odor (0-5): 0   Level of itching (0-5): 0   Dressing changes needed for drainage:Once a day   How much drainage: a little drainage   Flare in the last month (Y/N)? Yes.   How long ago was the last flare? in last 6 months   Developing new lesions? once a month   Number of inflammatory lesions montly: 3-5   DLQI: not answered   Current treatment: Humira   How helpful is the current treatment in managing the following aspects of your disease?  Not at all helpful Somewhat helpful Very helpful   Pain      Decreasing length of flares x     Decreasing new lesions x     Drainage x x    Decreasing frequency of flares x     Decreasing severity of flares x     Odor n/a  Hep B core Ab was pos back in 12/19, but was repeated and is neg now. ??  Hep B surface Ab negative.   Heb surface Ag neg.   ??HCV Ab neg. ??  HBV PCR neg.   ??CRB 26.   Quant gold neg.   ??CCP, anti-dsDNA, RF neg. ??   G6PD normal. ??  UA, CMET, CBC with diff normal.   ??Anti-SSA elevated at 6.2 (ULN 0.9). Prior treatments:  Topical: hibiclens, clindamycin   Systemic: unsure of which antibiotics   Past surgical procedures: excision or unroofing of the left axilla, pilonidal cyst excision years ago    Past laser procedures: none    ROS:     The balance of 10 systems is negative unless otherwise documented.    PE:  OBJECTIVE:   Gen: Well-appearing patient, appropriate, interactive, in no acute distress  Skin: Examination of the scalp, face, neck, chest, back, abdomen, bilateral upper and lower extremities, hands, palms, soles, nails, buttocks, and external genitalia performed today and pertinent for:     location Abscess Inflamed nodule Non-inflamed nodule Draining sinus Non-draining Sinus Hurley % scar   R axilla  1 1  1      L axilla  1     2   R inframammary          L inframammary          Intermammary          Pubic     1     R inguinal  1        R thigh          L inguinal  1        L thigh          Scrotum/Vulva          Perianal          R buttock          L buttock          Other (list)                      AN count (total sum of abscess and inflammatory nodule): 4  Pilonidal sinus (Y/N, or previously treated)? excised previously  Approximate BSA involved by inflammatory lesions: 0.2  Intertriginous comedones: few  Diffuse comedones (trunk, face, etc): none  Acne scars: none  Cribriform scarring: No.  Intertrigionus epidermal inclusion cysts: none  Diffuse (trunk, feace, extremities) epidermal inclusion cysts: none

## 2019-09-24 LAB — QUANTIFERON TB GOLD PLUS
QUANTIFERON ANTIGEN 1 MINUS NIL: 0.01 [IU]/mL
QUANTIFERON ANTIGEN 2 MINUS NIL: 0 [IU]/mL
QUANTIFERON TB GOLD PLUS: NEGATIVE
QUANTIFERON TB NIL VALUE: 0.07 [IU]/mL

## 2019-09-24 LAB — TB MITOGEN VALUE: Lab: 2.42

## 2019-09-24 LAB — TB AG2 VALUE: Lab: 0.07

## 2019-09-24 LAB — TB AG1 VALUE: Lab: 0.08

## 2019-09-24 LAB — TB NIL VALUE: Lab: 0.07

## 2019-09-24 LAB — QUANTIFERON MITOGEN: Lab: 2.35

## 2019-10-10 NOTE — Unmapped (Signed)
[facility] Refill Coordination Note    Specialty Medication(s) to be Shipped:   Inflammatory Disorders: Humira    Other medication(s) to be shipped: n/a     Alexandra Peterson, DOB: 10/06/1961  Phone: [phone] (home)       All above HIPAA information was verified with patient.     Was a Nurse, learning disability used for this call? No    Completed refill call assessment today to schedule patient's medication shipment from the [facility] [phone]).       Specialty medication(s) and dose(s) confirmed: Regimen is correct and unchanged.   Changes to medications: Alexandra Peterson reports no changes at this time.  Changes to insurance: No  Questions for the pharmacist: No    Confirmed patient received Welcome Packet with first shipment. The patient will receive a drug information handout for each medication shipped and additional FDA Medication Guides as required.       DISEASE/MEDICATION-SPECIFIC INFORMATION        For patients on injectable medications: Patient currently has 1 doses left.  Next injection is scheduled for 10/10/2019.    SPECIALTY MEDICATION ADHERENCE     Medication Adherence    Patient reported X missed doses in the last month: 0  Specialty Medication: humira cf 40 mg/0.4 ml   Patient is on additional specialty medications: No  Any gaps in refill history greater than 2 weeks in the last 3 months: no  Demonstrates understanding of importance of adherence: yes  Informant: patient  Reliability of informant: reliable  Confirmed plan for next specialty medication refill: delivery by pharmacy  Refills needed for supportive medications: not needed                humira cf 40 mg/0.4 ml . 7 days on hand      SHIPPING     Shipping address confirmed in Epic.     Delivery Scheduled: Yes, Expected medication delivery date: 10/14/2019.     Medication will be delivered via Same Day Courier to the prescription address in Epic WAM.    Alexandra Peterson Alexandra Peterson [facility] Shared [facility] Specialty Technician

## 2019-10-14 MED FILL — HUMIRA PEN CITRATE FREE 40 MG/0.4 ML: 28 days supply | Qty: 4 | Fill #2 | Status: AC

## 2019-10-14 MED FILL — HUMIRA PEN CITRATE FREE 40 MG/0.4 ML: SUBCUTANEOUS | 28 days supply | Qty: 4 | Fill #2

## 2019-11-07 DIAGNOSIS — L732 Hidradenitis suppurativa: Principal | ICD-10-CM

## 2019-11-07 MED ORDER — HUMIRA PEN CITRATE FREE 40 MG/0.4 ML
SUBCUTANEOUS | 2 refills | 28 days | Status: CP
Start: 2019-11-07 — End: ?
  Filled 2019-11-12: qty 4, 28d supply, fill #0

## 2019-11-07 NOTE — Unmapped (Signed)
[facility] Refill Coordination Note    Specialty Medication(s) to be Shipped:   Inflammatory Disorders: Humira    Other medication(s) to be shipped: n/a     Alexandra Peterson, DOB: [birth year]  Phone: [phone] (home)       All above HIPAA information was verified with patient.     Was a Nurse, learning disability used for this call? No    Completed refill call assessment today to schedule patient's medication shipment from the [facility] [phone]).       Specialty medication(s) and dose(s) confirmed: Regimen is correct and unchanged.   Changes to medications: Alexandra Peterson reports no changes at this time.  Changes to insurance: No  Questions for the pharmacist: No    Confirmed patient received Welcome Packet with first shipment. The patient will receive a drug information handout for each medication shipped and additional FDA Medication Guides as required.       DISEASE/MEDICATION-SPECIFIC INFORMATION        For patients on injectable medications: Patient currently has 1 doses left.  Next injection is scheduled for 11/08/2019.    SPECIALTY MEDICATION ADHERENCE     Medication Adherence    Patient reported X missed doses in the last month: 0  Specialty Medication: Humira cf 40 mg/0.4 ml  Patient is on additional specialty medications: No  Any gaps in refill history greater than 2 weeks in the last 3 months: no  Demonstrates understanding of importance of adherence: yes  Informant: patient  Reliability of informant: reliable  Confirmed plan for next specialty medication refill: delivery by pharmacy  Refills needed for supportive medications: not needed                Humira cf 40 mg/0.4 ml. 7 days on hand      SHIPPING     Shipping address confirmed in Epic.     Delivery Scheduled: Yes, Expected medication delivery date: 11/12/2019.  However, Rx request for refills was sent to the provider as there are none remaining.     Medication will be delivered via Same Day Courier to the prescription address in Epic WAM.    Alexandra Peterson D Alexandra Peterson   [facility] Shared [facility] Specialty Technician

## 2019-11-07 NOTE — Unmapped (Signed)
Rx request received for:  Requested Prescriptions     Pending Prescriptions Disp Refills   ??? HUMIRA PEN CITRATE FREE 40 MG/0.4 ML 4 each 2     Sig: Inject the contents of 1 pen (40mg ) under the skin once weekly.

## 2019-11-12 MED FILL — HUMIRA PEN CITRATE FREE 40 MG/0.4 ML: 28 days supply | Qty: 4 | Fill #0 | Status: AC

## 2019-11-12 NOTE — Unmapped (Signed)
Pt called nurse line and states that she missed a dose of her humira but she contacted [facility] and they will be sending her a new dose today.

## 2019-12-04 NOTE — Unmapped (Addendum)
Alexandra Peterson reports things are stable with her Humira - she continues to have some flares, but feels overall the medication has helped.     She has stopped her methotrexate after having some nosebleeds. We discussed her labs looked good (no thrombocytopenia), but she is doing OK off medication too.     [facility] Shared [facility] Clinical Assessment & Refill Coordination Note    Alexandra Peterson, DOB: June 19, 1961  Phone: [phone] (home)     All above HIPAA information was verified with patient.     Was a Nurse, learning disability used for this call? No    Specialty Medication(s):   Inflammatory Disorders: Humira     Current Outpatient Medications   Medication Sig Dispense Refill   ??? clobetasoL (TEMOVATE) 0.05 % ointment Apply topically Two (2) times a day. 60 g 3   ??? fluticasone (FLONASE SENSIMIST) 27.5 mcg/actuation nasal spray 1 spray daily.      ??? folic acid (FOLVITE) 1 MG tablet Take one pill a day on your non methotrexate day. 100 tablet 3   ??? HUMIRA PEN CITRATE FREE 40 MG/0.4 ML Inject the contents of 1 pen (40mg ) under the skin once weekly. 4 each 2   ??? meloxicam (MOBIC) 15 MG tablet Take 15 mg by mouth daily.      ??? methotrexate 2.5 MG tablet Take 6 pills once a week. 30 tablet 3     Current Facility-Administered Medications   Medication Dose Route Frequency Provider Last Rate Last Admin   ??? triamcinolone acetonide (KENALOG-40) injection 40 mg  40 mg Intralesional Once Alexandra Stain, MD            Changes to medications: Alexandra Peterson reports no changes at this time.    Allergies   Allergen Reactions   ??? Sulfamethoxazole-Trimethoprim Swelling   ??? Adhesive Rash   ??? Bupropion Hcl Other (See Comments)     REACTION: irritability  REACTION: irritability     ??? Celecoxib Other (See Comments)     REACTION: edema  REACTION: edema     ??? Other Other (See Comments)     REACTION: not effective   ??? Sertraline Hcl Other (See Comments)     REACTION: wt gain  REACTION: wt gain     ??? Adhesive Tape-Silicones Rash       Changes to allergies: No    SPECIALTY MEDICATION ADHERENCE     Humira - 1 left  Medication Adherence    Patient reported X missed doses in the last month: 0  Specialty Medication: Humira  Patient is on additional specialty medications: No          Specialty medication(s) dose(s) confirmed: Regimen is correct and unchanged.     Are there any concerns with adherence? No    Adherence counseling provided? Not needed    CLINICAL MANAGEMENT AND INTERVENTION      Clinical Benefit Assessment:    Do you feel the medicine is effective or helping your condition? Yes    Clinical Benefit counseling provided? Not needed    Adverse Effects Assessment:    Are you experiencing any side effects? No    Are you experiencing difficulty administering your medicine? No    Quality of Life Assessment:    How many days over the past month did your HS  keep you from your normal activities? For example, brushing your teeth or getting up in the morning. 0    Have you discussed this with your provider? Not needed  Therapy Appropriateness:    Is therapy appropriate? Yes, therapy is appropriate and should be continued    DISEASE/MEDICATION-SPECIFIC INFORMATION      For patients on injectable medications: Patient currently has 1 doses left.  Next injection is scheduled for 2/5.    PATIENT SPECIFIC NEEDS     ? Does the patient have any physical, cognitive, or cultural barriers? No    ? Is the patient high risk? No     ? Does the patient require a Care Management Plan? No     ? Does the patient require physician intervention or other additional services (i.e. nutrition, smoking cessation, social work)? No      SHIPPING     Specialty Medication(s) to be Shipped:   Inflammatory Disorders: Humira    Other medication(s) to be shipped: na     Changes to insurance: No    Delivery Scheduled: Yes, Expected medication delivery date: 2/10.     Medication will be delivered via Same Day Courier to the confirmed prescription address in [facility].    The patient will receive a drug information handout for each medication shipped and additional FDA Medication Guides as required.  Verified that patient has previously received a Conservation officer, historic buildings.    All of the patient's questions and concerns have been addressed.    Alexandra Peterson   [facility] Shared [facility] Specialty Pharmacist

## 2019-12-10 MED FILL — HUMIRA PEN CITRATE FREE 40 MG/0.4 ML: SUBCUTANEOUS | 28 days supply | Qty: 4 | Fill #1

## 2019-12-10 MED FILL — HUMIRA PEN CITRATE FREE 40 MG/0.4 ML: 28 days supply | Qty: 4 | Fill #1 | Status: AC

## 2019-12-31 NOTE — Unmapped (Signed)
[facility] Refill Coordination Note    Specialty Medication(s) to be Shipped:   Inflammatory Disorders: Humira    Other medication(s) to be shipped: n/a     Alexandra Peterson, DOB: 08-08-1961  Phone: [phone] (home)       All above HIPAA information was verified with patient.     Was a Nurse, learning disability used for this call? No    Completed refill call assessment today to schedule patient's medication shipment from the [facility] [phone]).       Specialty medication(s) and dose(s) confirmed: Regimen is correct and unchanged.   Changes to medications: Alexandra Peterson reports no changes at this time.  Changes to insurance: No  Questions for the pharmacist: No    Confirmed patient received Welcome Packet with first shipment. The patient will receive a drug information handout for each medication shipped and additional FDA Medication Guides as required.       DISEASE/MEDICATION-SPECIFIC INFORMATION        For patients on injectable medications: Patient currently has 1 doses left.  Next injection is scheduled for 01/02/2020.    SPECIALTY MEDICATION ADHERENCE     Medication Adherence    Patient reported X missed doses in the last month: 0  Specialty Medication: Humira cf 40 mg/0.4 ml   Patient is on additional specialty medications: No  Any gaps in refill history greater than 2 weeks in the last 3 months: no  Demonstrates understanding of importance of adherence: yes  Informant: patient  Reliability of informant: reliable  Confirmed plan for next specialty medication refill: delivery by pharmacy                Humira cf 40 mg/0.4 ml. 7 days on hand      SHIPPING     Shipping address confirmed in Epic.     Delivery Scheduled: Yes, Expected medication delivery date: 01/05/2020.     Medication will be delivered via Same Day Courier to the prescription address in Epic WAM.    Alexandra Peterson Alexandra Peterson   [facility] Specialty Technician

## 2020-01-05 MED FILL — HUMIRA PEN CITRATE FREE 40 MG/0.4 ML: SUBCUTANEOUS | 28 days supply | Qty: 4 | Fill #2

## 2020-01-05 MED FILL — HUMIRA PEN CITRATE FREE 40 MG/0.4 ML: 28 days supply | Qty: 4 | Fill #2 | Status: AC

## 2020-01-28 DIAGNOSIS — L732 Hidradenitis suppurativa: Principal | ICD-10-CM

## 2020-01-28 MED ORDER — HUMIRA PEN CITRATE FREE 40 MG/0.4 ML
SUBCUTANEOUS | 2 refills | 28.00000 days | Status: CP
Start: 2020-01-28 — End: ?
  Filled 2020-02-03: qty 4, 28d supply, fill #0

## 2020-01-29 NOTE — Unmapped (Signed)
[facility] Refill Coordination Note    Specialty Medication(s) to be Shipped:   Inflammatory Disorders: Humira    Other medication(s) to be shipped: n/a     Alexandra Peterson, DOB: Feb 04, 1961  Phone: [phone] (home)       All above HIPAA information was verified with patient.     Completed refill call assessment today to schedule patient's medication shipment from the [facility] [phone]).       Specialty medication(s) and dose(s) confirmed: Regimen is correct and unchanged.   Changes to medications: [name] reports no changes reported at this time.  Changes to insurance: No  Questions for the pharmacist: No    Confirmed patient received Welcome Packet with first shipment. The patient will receive a drug information handout for each medication shipped and additional FDA Medication Guides as required.       DISEASE/MEDICATION-SPECIFIC INFORMATION        For patients on injectable medications: Patient currently has 1 doses left.  Next injection is scheduled for 01/30/20.    SPECIALTY MEDICATION ADHERENCE     Medication Adherence    Patient reported X missed doses in the last month: 0  Specialty Medication: Humira 40 mg/0.4 ml  Informant: patient  Confirmed plan for next specialty medication refill: delivery by pharmacy  Refills needed for supportive medications: yes, ordered or provider notified                Humira 40/0.4 mg/ml: 8 days of medicine on hand       SHIPPING     Shipping address confirmed in Epic.     Delivery Scheduled: Yes, Expected medication delivery date: 02/03/20.     Medication will be delivered via Same Day Courier to the prescription address in Epic WAM.    Alexandra Peterson Alexandra Peterson   [facility] Specialty Pharmacist

## 2020-02-03 MED FILL — HUMIRA PEN CITRATE FREE 40 MG/0.4 ML: 28 days supply | Qty: 4 | Fill #0 | Status: AC

## 2020-02-19 DIAGNOSIS — M159 Polyosteoarthritis, unspecified: Secondary | ICD-10-CM | POA: Insufficient documentation

## 2020-02-26 NOTE — Unmapped (Signed)
[facility] Refill Coordination Note    Specialty Medication(s) to be Shipped:   Inflammatory Disorders: Humira    Other medication(s) to be shipped: n/a     Alexandra Peterson, DOB: 1961-07-26  Phone: [phone] (home)       All above HIPAA information was verified with patient.     Completed refill call assessment today to schedule patient's medication shipment from the [facility] [phone]).       Specialty medication(s) and dose(s) confirmed: Regimen is correct and unchanged.   Changes to medications: Alexandra Peterson reports no changes reported at this time.  Changes to insurance: No  Questions for the pharmacist: No    Confirmed patient received Welcome Packet with first shipment. The patient will receive a drug information handout for each medication shipped and additional FDA Medication Guides as required.       DISEASE/MEDICATION-SPECIFIC INFORMATION        For patients on injectable medications: Patient currently has 1 doses left.  Next injection is scheduled for 02/27/2020.    SPECIALTY MEDICATION ADHERENCE     Medication Adherence    Patient reported X missed doses in the last month: 0  Specialty Medication: Humira cf 40 mg/0.4 ml  Patient is on additional specialty medications: No  Any gaps in refill history greater than 2 weeks in the last 3 months: no  Demonstrates understanding of importance of adherence: yes  Informant: patient  Reliability of informant: reliable  Confirmed plan for next specialty medication refill: delivery by pharmacy  Refills needed for supportive medications: not needed                Humira 40/0.4 mg/ml: 7 days of medicine on hand       SHIPPING     Shipping address confirmed in Epic.     Delivery Scheduled: Yes, Expected medication delivery date: 03/01/2020.     Medication will be delivered via Same Day Courier to the prescription address in Epic WAM.    Alexandra Peterson   [facility] Specialty Pharmacist

## 2020-02-26 NOTE — Unmapped (Signed)
Records from [facility]

## 2020-03-01 MED FILL — HUMIRA PEN CITRATE FREE 40 MG/0.4 ML: 28 days supply | Qty: 4 | Fill #1 | Status: AC

## 2020-03-01 MED FILL — HUMIRA PEN CITRATE FREE 40 MG/0.4 ML: SUBCUTANEOUS | 28 days supply | Qty: 4 | Fill #1

## 2020-03-24 NOTE — Unmapped (Signed)
[facility] Refill Coordination Note    Specialty Medication(s) to be Shipped:   Inflammatory Disorders: Humira    Other medication(s) to be shipped: n/a     Alexandra Peterson, DOB: 07-06-1961  Phone: [phone] (home)       All above HIPAA information was verified with patient.     Completed refill call assessment today to schedule patient's medication shipment from the [facility] [phone]).       Specialty medication(s) and dose(s) confirmed: Regimen is correct and unchanged.   Changes to medications: Alexandra Peterson reports no changes reported at this time.  Changes to insurance: No  Questions for the pharmacist: No    Confirmed patient received Welcome Packet with first shipment. The patient will receive a drug information handout for each medication shipped and additional FDA Medication Guides as required.       DISEASE/MEDICATION-SPECIFIC INFORMATION        For patients on injectable medications: Patient currently has 1 doses left.  Next injection is scheduled for 03/26/2020.    SPECIALTY MEDICATION ADHERENCE     Medication Adherence    Patient reported X missed doses in the last month: 0  Specialty Medication: Humira cf 40 mg/0.4 ml  Patient is on additional specialty medications: No  Any gaps in refill history greater than 2 weeks in the last 3 months: no  Demonstrates understanding of importance of adherence: yes  Informant: patient  Reliability of informant: reliable  Confirmed plan for next specialty medication refill: delivery by pharmacy  Refills needed for supportive medications: not needed                Humira 40/0.4 mg/ml: 7 days of medicine on hand       SHIPPING     Shipping address confirmed in Epic.     Delivery Scheduled: Yes, Expected medication delivery date: 03/26/2020.     Medication will be delivered via Same Day Courier to the prescription address in Epic WAM.    Alexandra Peterson   [facility] Specialty Pharmacist

## 2020-03-26 MED FILL — HUMIRA PEN CITRATE FREE 40 MG/0.4 ML: 28 days supply | Qty: 4 | Fill #2 | Status: AC

## 2020-03-26 MED FILL — HUMIRA PEN CITRATE FREE 40 MG/0.4 ML: SUBCUTANEOUS | 28 days supply | Qty: 4 | Fill #2

## 2020-04-16 DIAGNOSIS — L732 Hidradenitis suppurativa: Principal | ICD-10-CM

## 2020-04-19 DIAGNOSIS — M25511 Pain in right shoulder: Secondary | ICD-10-CM | POA: Insufficient documentation

## 2020-04-19 DIAGNOSIS — M25512 Pain in left shoulder: Secondary | ICD-10-CM | POA: Insufficient documentation

## 2020-04-19 MED ORDER — HUMIRA PEN CITRATE FREE 40 MG/0.4 ML
SUBCUTANEOUS | 2 refills | 28.00000 days | Status: CP
Start: 2020-04-19 — End: ?

## 2020-04-19 NOTE — Unmapped (Signed)
Patient needs follow up prior to more refills.

## 2020-04-21 NOTE — Unmapped (Signed)
Called and spoke to patient who states that she has stopped Humira as directed by her Rheumatologists and Dr Janyth Contes is aware. She has been off of Humira for 3 weeks now and reports that she feels better than she ever has, pain-wise. Nemesis does report that one small HS spot has come up since stopping the Humira but she says that she rather have that than to be in the pain she was previously in. I informed patient that I would follow up with her in 1 month to see how things are going and if she is going to continue the Humira or not. Patient also plans to follow up with Dr Janyth Contes in near future. Rescheduling call.

## 2020-04-26 NOTE — Unmapped (Signed)
[facility] notes scanned into chart for your review

## 2020-05-25 NOTE — Unmapped (Signed)
This patient has been disenrolled from the [facility] specialty pharmacy services due to medication discontinuation resulting from side effect intolerance - she reports she was having symptoms of drug-induced lupus, but is feeling better now. I'll tag Dr. Janyth Contes so he's aware.     [name]  [facility] Specialty Pharmacist

## 2020-09-06 ENCOUNTER — Ambulatory Visit: Admit: 2020-09-06 | Discharge: 2020-09-07 | Payer: PRIVATE HEALTH INSURANCE

## 2020-09-06 DIAGNOSIS — L732 Hidradenitis suppurativa: Principal | ICD-10-CM

## 2020-09-06 DIAGNOSIS — L821 Other seborrheic keratosis: Principal | ICD-10-CM

## 2020-09-06 DIAGNOSIS — L720 Epidermal cyst: Principal | ICD-10-CM

## 2020-09-06 DIAGNOSIS — L650 Telogen effluvium: Principal | ICD-10-CM

## 2020-09-06 MED ORDER — BIMATOPROST 0.03 % DROPS WITH APPLICATOR, EYELASH BASE
Freq: Every evening | OPHTHALMIC | 1 refills | 0.00000 days | Status: CP
Start: 2020-09-06 — End: 2021-09-06

## 2020-10-26 ENCOUNTER — Encounter: Admit: 2020-10-26 | Discharge: 2020-10-27 | Payer: PRIVATE HEALTH INSURANCE

## 2020-10-26 ENCOUNTER — Ambulatory Visit: Admit: 2020-10-26 | Discharge: 2020-10-27 | Payer: PRIVATE HEALTH INSURANCE

## 2020-10-26 DIAGNOSIS — Z1231 Encounter for screening mammogram for malignant neoplasm of breast: Principal | ICD-10-CM

## 2020-11-05 ENCOUNTER — Other Ambulatory Visit: Payer: Self-pay

## 2020-11-05 DIAGNOSIS — Z20822 Contact with and (suspected) exposure to covid-19: Secondary | ICD-10-CM

## 2020-11-05 DIAGNOSIS — R928 Other abnormal and inconclusive findings on diagnostic imaging of breast: Principal | ICD-10-CM

## 2020-11-09 LAB — NOVEL CORONAVIRUS, NAA: SARS-CoV-2, NAA: NOT DETECTED

## 2020-11-24 DIAGNOSIS — M19041 Primary osteoarthritis, right hand: Secondary | ICD-10-CM | POA: Insufficient documentation

## 2020-11-29 ENCOUNTER — Other Ambulatory Visit: Payer: Self-pay | Admitting: Family Medicine

## 2020-11-29 DIAGNOSIS — R928 Other abnormal and inconclusive findings on diagnostic imaging of breast: Secondary | ICD-10-CM

## 2020-12-10 ENCOUNTER — Ambulatory Visit: Payer: Self-pay

## 2020-12-10 ENCOUNTER — Other Ambulatory Visit: Payer: Self-pay

## 2020-12-10 ENCOUNTER — Ambulatory Visit
Admission: RE | Admit: 2020-12-10 | Discharge: 2020-12-10 | Disposition: A | Discharge status: Home / Self Care | Payer: 59 | Source: Ambulatory Visit | Attending: Family Medicine | Admitting: Family Medicine

## 2020-12-10 DIAGNOSIS — R928 Other abnormal and inconclusive findings on diagnostic imaging of breast: Secondary | ICD-10-CM

## 2022-02-17 DIAGNOSIS — R69 Illness, unspecified: Secondary | ICD-10-CM | POA: Diagnosis not present

## 2022-02-17 DIAGNOSIS — R079 Chest pain, unspecified: Secondary | ICD-10-CM | POA: Diagnosis not present

## 2022-02-17 DIAGNOSIS — R42 Dizziness and giddiness: Secondary | ICD-10-CM | POA: Diagnosis not present

## 2022-02-17 DIAGNOSIS — R002 Palpitations: Secondary | ICD-10-CM | POA: Diagnosis not present

## 2022-03-06 DIAGNOSIS — R079 Chest pain, unspecified: Secondary | ICD-10-CM | POA: Diagnosis not present

## 2022-03-06 DIAGNOSIS — R002 Palpitations: Secondary | ICD-10-CM | POA: Diagnosis not present

## 2022-03-14 DIAGNOSIS — H04123 Dry eye syndrome of bilateral lacrimal glands: Secondary | ICD-10-CM | POA: Diagnosis not present

## 2022-03-14 DIAGNOSIS — H2513 Age-related nuclear cataract, bilateral: Secondary | ICD-10-CM | POA: Diagnosis not present

## 2022-03-20 ENCOUNTER — Encounter: Payer: Self-pay | Admitting: Internal Medicine

## 2023-05-24 DIAGNOSIS — I4891 Unspecified atrial fibrillation: Secondary | ICD-10-CM | POA: Insufficient documentation

## 2023-06-11 ENCOUNTER — Other Ambulatory Visit: Payer: Self-pay

## 2023-06-11 ENCOUNTER — Emergency Department: Payer: 59

## 2023-06-11 ENCOUNTER — Emergency Department
Admission: EM | Admit: 2023-06-11 | Discharge: 2023-06-12 | Disposition: A | Discharge status: Home / Self Care | Payer: 59 | Attending: Emergency Medicine | Admitting: Emergency Medicine

## 2023-06-11 DIAGNOSIS — I10 Essential (primary) hypertension: Secondary | ICD-10-CM | POA: Diagnosis not present

## 2023-06-11 DIAGNOSIS — Z7901 Long term (current) use of anticoagulants: Secondary | ICD-10-CM | POA: Diagnosis not present

## 2023-06-11 DIAGNOSIS — R202 Paresthesia of skin: Secondary | ICD-10-CM | POA: Insufficient documentation

## 2023-06-11 DIAGNOSIS — R0789 Other chest pain: Secondary | ICD-10-CM | POA: Diagnosis present

## 2023-06-11 LAB — CBC
HCT: 39.7 % (ref 36.0–46.0)
Hemoglobin: 13.7 g/dL (ref 12.0–15.0)
MCH: 31 pg (ref 26.0–34.0)
MCHC: 34.5 g/dL (ref 30.0–36.0)
MCV: 89.8 fL (ref 80.0–100.0)
Platelets: 238 10*3/uL (ref 150–400)
RBC: 4.42 MIL/uL (ref 3.87–5.11)
RDW: 12 % (ref 11.5–15.5)
WBC: 8.9 10*3/uL (ref 4.0–10.5)
nRBC: 0 % (ref 0.0–0.2)

## 2023-06-11 LAB — BASIC METABOLIC PANEL
Anion gap: 8 (ref 5–15)
BUN: 16 mg/dL (ref 8–23)
CO2: 26 mmol/L (ref 22–32)
Calcium: 8.8 mg/dL — ABNORMAL LOW (ref 8.9–10.3)
Chloride: 102 mmol/L (ref 98–111)
Creatinine, Ser: 0.62 mg/dL (ref 0.44–1.00)
GFR, Estimated: >60 mL/min (ref >60)
Glucose, Bld: 110 mg/dL — ABNORMAL HIGH (ref 70–99)
Potassium: 3.8 mmol/L (ref 3.5–5.1)
Sodium: 136 mmol/L (ref 135–145)

## 2023-06-11 LAB — TROPONIN I (HIGH SENSITIVITY): Troponin I (High Sensitivity): 4 ng/L (ref <18)

## 2023-06-11 MED ORDER — IOHEXOL 350 MG/ML SOLN
75.0000 mL | Freq: Once | INTRAVENOUS | Status: AC | PRN
  Administered 2023-06-11: 75 mL via INTRAVENOUS

## 2023-06-11 NOTE — ED Provider Notes (Signed)
[facility] Provider Note    Event Date/Time   First MD Initiated Contact with Patient 06/11/23 2259     (approximate)   History   Chest Pain   HPI  Patricia [name] is a 62 y.o. female who presents to the ED for evaluation of Chest Pain   I reviewed PCP visit from 8/1.  Obese patient with history of GERD, HTN and A-fib who has been prescribed Eliquis, but as of this visit a couple weeks ago she was not taking it due to a tooth extraction.  Patient presents to the ED for evaluation of left-sided chest pain that began just in the past couple hours at home.  She was doing minimal activity, folding laundry, she developed left-sided anterior chest pain.  Some tingling of the left hand that she minimizes but no other associated symptoms.  No dyspnea, cough, syncope, emesis, falls or trauma.  She just started her Eliquis 48hrs ago. First dose the evening of 8/10.   Physical Exam   Triage Vital Signs: ED Triage Vitals  Encounter Vitals Group     BP 06/11/23 2257 134/77     Systolic BP Percentile --      Diastolic BP Percentile --      Pulse Rate 06/11/23 2257 69     Resp 06/11/23 2257 18     Temp 06/11/23 2257 98.3 F (36.8 C)     Temp Source 06/11/23 2257 Oral     SpO2 06/11/23 2257 97 %     Weight 06/11/23 2253 235 lb (106.6 kg)     Height 06/11/23 2253 5\' 4"  (1.626 m)     Head Circumference --      Peak Flow --      Pain Score 06/11/23 2253 3     Pain Loc --      Pain Education --      Exclude from Growth Chart --     Most recent vital signs: Vitals:   06/12/23 0200 06/12/23 0230  BP: (!) 108/57 103/77  Pulse: (!) 57 (!) 55  Resp: 18 19  Temp:    SpO2: 95% 94%    General: Awake, no distress. Well appearing and conversational CV:  Good peripheral perfusion.  Resp:  Normal effort.  Abd:  No distention.  MSK:  No deformity noted. Reproducible chest pain on palpation, no overlying skin changes Neuro:  No focal deficits  appreciated. Other:     ED Results / Procedures / Treatments   Labs (all labs ordered are listed, but only abnormal results are displayed) Labs Reviewed  BASIC METABOLIC PANEL - Abnormal; Notable for the following components:      Result Value   Glucose, Bld 110 (*)    Calcium 8.8 (*)    All other components within normal limits  CBC  TROPONIN I (HIGH SENSITIVITY)  TROPONIN I (HIGH SENSITIVITY)    EKG Sinus rhythm with a rate of 68 bpm.  Normal axis and intervals.  No clear signs of acute ischemia.  RADIOLOGY CXR interpreted by me without evidence of acute cardiopulmonary pathology.  Official radiology report(s): CT Angio Chest PE W and/or Wo Contrast  Result Date: 06/12/2023 CLINICAL DATA:  AFib, left chest pain, evaluate for PE EXAM: CT ANGIOGRAPHY CHEST WITH CONTRAST TECHNIQUE: Multidetector CT imaging of the chest was performed using the standard protocol during bolus administration of intravenous contrast. Multiplanar CT image reconstructions and MIPs were obtained to evaluate the vascular anatomy. RADIATION DOSE REDUCTION: This  exam was performed according to the departmental dose-optimization program which includes automated exposure control, adjustment of the mA and/or kV according to patient size and/or use of iterative reconstruction technique. CONTRAST:  75mL OMNIPAQUE IOHEXOL 350 MG/ML SOLN COMPARISON:  Chest radiographs dated 06/11/2023 FINDINGS: Cardiovascular: Satisfactory opacification the bilateral pulmonary arteries to the segmental level. No evidence of pulmonary embolism. Although not tailored for evaluation of the thoracic aorta, there is no evidence of thoracic aortic aneurysm or dissection. Mild atherosclerotic calcifications. The heart is top-normal in size.  No pericardial effusion. Mediastinum/Nodes: No suspicious mediastinal lymphadenopathy. Visualized thyroid is unremarkable. Lungs/Pleura: Ground-glass opacity/mosaic attenuation in the lungs bilaterally,  nonspecific. Mild linear atelectasis in the lingula, left lower lobe, and right lung base. No focal consolidation. No suspicious pulmonary nodules. No pleural effusion or pneumothorax. Upper Abdomen: Visualized upper abdomen is grossly unremarkable, noting mild vascular calcifications. Musculoskeletal: Mild degenerative changes of the visualized thoracolumbar spine. Review of the MIP images confirms the above findings. IMPRESSION: No evidence of pulmonary embolism. No acute cardiopulmonary disease. Aortic Atherosclerosis (ICD10-I70.0). Electronically Signed   By: Charline Bills M.D.   On: 06/12/2023 00:14   DG Chest 2 View  Result Date: 06/11/2023 CLINICAL DATA:  Chest pain. Acute onset left-sided chest pain at 2100 hours. EXAM: CHEST - 2 VIEW COMPARISON:  None Available. FINDINGS: The heart size and mediastinal contours are within normal limits. Both lungs are clear. The visualized skeletal structures are unremarkable. IMPRESSION: No active cardiopulmonary disease. Electronically Signed   By: Burman Nieves M.D.   On: 06/11/2023 23:10    PROCEDURES and INTERVENTIONS:  Procedures  Medications  iohexol (OMNIPAQUE) 350 MG/ML injection 75 mL (75 mLs Intravenous Contrast Given 06/11/23 2355)     IMPRESSION / MDM / ASSESSMENT AND PLAN / ED COURSE  I reviewed the triage vital signs and the nursing notes.  Differential diagnosis includes, but is not limited to, CXR interpreted by me without evidence of acute cardiopulmonary pathology.  {Patient presents with symptoms of an acute illness or injury that is potentially life-threatening.  Patient presents with atypical chest discomfort suitable for trial of outpatient management.  History of A-fib and just started Eliquis in the past 48 hours.  No PE on CTA chest.  Nonischemic EKG and she remains in a sinus rhythm with negative troponins, normal CBC and metabolic panel.  While I considered observation admission for this patient, she is suitable for  trial of outpatient management.  Clinical Course as of 06/12/23 0511  Tue Jun 12, 2023  0027 Reassessed, feeling well.  Discussed reassuring workup so far and plan for second troponin [DS]    Clinical Course User Index [DS] Delton Prairie, MD     FINAL CLINICAL IMPRESSION(S) / ED DIAGNOSES   Final diagnoses:  Other chest pain     Rx / DC Orders   ED Discharge Orders     None        Note:  This document was prepared using Dragon voice recognition software and may include unintentional dictation errors.   Delton Prairie, MD 06/12/23 [phone]

## 2023-06-11 NOTE — ED Triage Notes (Signed)
Pt reports acute onset L sided chest discomfort at 2100. Denies radiation to other areas. Denies associated h/a, dizziness, n/v, sob, or vision change. Pt reports was recently dx with afib and has been compliant with medications since. Pt ambulatory to triage. Alert and oriented. Breathing unlabored speaking in full sentences.

## 2023-06-12 LAB — TROPONIN I (HIGH SENSITIVITY): Troponin I (High Sensitivity): 3 ng/L (ref <18)

## 2023-06-12 NOTE — Discharge Instructions (Signed)
Keep taking your Eliquis  Use Tylenol for pain and fevers.  Up to 1000 mg per dose, up to 4 times per day.  Do not take more than 4000 mg of Tylenol/acetaminophen within 24 hours.Marland Kitchen

## 2023-08-30 ENCOUNTER — Other Ambulatory Visit: Payer: Self-pay | Admitting: Family Medicine

## 2023-08-30 DIAGNOSIS — Z1231 Encounter for screening mammogram for malignant neoplasm of breast: Secondary | ICD-10-CM

## 2023-10-09 ENCOUNTER — Ambulatory Visit
Admission: RE | Admit: 2023-10-09 | Discharge: 2023-10-09 | Disposition: A | Discharge status: Home / Self Care | Payer: 59 | Source: Ambulatory Visit | Attending: Family Medicine | Admitting: Family Medicine

## 2023-10-09 DIAGNOSIS — Z1231 Encounter for screening mammogram for malignant neoplasm of breast: Secondary | ICD-10-CM

## 2024-01-28 ENCOUNTER — Telehealth: Payer: Self-pay | Admitting: Cardiovascular Disease

## 2024-01-28 NOTE — Telephone Encounter (Signed)
 Error

## 2024-03-01 NOTE — Progress Notes (Unsigned)
 Cardiology Office Note  Date:  03/03/2024   ID:  Patricia [name], DOB [birth year], MRN [id number]  PCP:  Nestor Banter, MD   Chief Complaint  Patient presents with   New Patient (Initial Visit)    Ref by Dr. Norberto Bear for PAF; former cardiac history at [facility]. Patient wore a Holter monitor in July 2024 with Dr. Norberto Bear.  Patient c/o occasional chest discomfort with some jaw pain at times.  Patient will need a cardiac clearance for a right knee surgery; not scheduled yet but should be toward the end of May 2025; [facility].     HPI:  Patricia [name] is a 63 year old woman with past medical history of PAD Hypertension Morbid obesity Sleep apnea ? anxiety Who presents by referral from Dr. Norberto Bear for consultation of her paroxysmal atrial fibrillation  Seen by primary care January 28, 2024 Notes indicating on Eliquis, carvedilol  Normal sinus rhythm on EKG November 2022 Stress test May 2023 Normal sinus rhythm on EKG August 2024 heart monitor that showed "episodes of afib".  Monitor requested and reviewed  Needs right knee surgery Sleeps in a recliner for comfort , less snoring  Symptoms from her atrial fibrillation Has apple watch, picking up afib Carvedilol recently increased up to 6.25 twice daily Heart races, 6 episodes in 3/25 Last 02/10/24 Lasting few hours  At home 130/80  CT chest 8/124 Images pulled up, no coronary calcium, Minimal aorta atherosclerosis  EKG personally reviewed by myself on todays visit EKG Interpretation Date/Time:  Monday Mar 03 2024 11:06:28 EDT Ventricular Rate:  74 PR Interval:  150 QRS Duration:  74 QT Interval:  388 QTC Calculation: 430 R Axis:   7  Text Interpretation: Normal sinus rhythm Normal ECG When compared with ECG of 11-Jun-2023 22:54, No significant change was found Confirmed by Belva Boyden [phone]) on 03/03/2024 11:08:53 AM    PMH:   has a past medical history of ADD (attention deficit disorder), Allergic rhinitis,  Anxiety, Chronic sinusitis, Complication of anesthesia, Degenerative disc disease, Depression, Gastritis, GERD (gastroesophageal reflux disease), Hidradenitis suppurativa, MRSA infection, Hypertension, Laryngopharyngeal reflux (LPR), MRSA nasal colonization (06/15/2012), Obesity, PONV (postoperative nausea and vomiting), Rectal bleeding, Rosacea, and Sjogren's disease (HCC).  PSH:    Past Surgical History:  Procedure Laterality Date   ANTERIOR CRUCIATE LIGAMENT REPAIR     CESAREAN SECTION  Feb. 1995   COLONOSCOPY  01/18/2012   HYDRADENITIS EXCISION Left 02/04/2016   Procedure: EXCISION HIDRADENITIS AXILLA;  Surgeon: Alben Alma, MD;  Location: [facility] ORS;  Service: General;  Laterality: Left;   HYSTEROSCOPY  february 2014   MICROLARYNGOSCOPY     PILONIDAL CYST DRAINAGE     SPINAL FUSION  03/30/2009   L4 L5 S1 Lumbar   SPINAL FUSION  Feb. 1997   Lumbar fusion   TONSILLECTOMY     UPPER GASTROINTESTINAL ENDOSCOPY  01/26/2009   Gastritis, GERD   UPPER GI ENDOSCOPY  2010    Current Outpatient Medications  Medication Sig Dispense Refill   carvedilol (COREG) 6.25 MG tablet Take 6.25 mg by mouth 2 (two) times daily with a meal.     ELIQUIS 5 MG TABS tablet Take 5 mg by mouth 2 (two) times daily.     fluticasone  (FLONASE  SENSIMIST) 27.5 MCG/SPRAY nasal spray Place 1 spray into the nose daily.     fluticasone  (FLONASE ) 50 MCG/ACT nasal spray Place 2 sprays into both nostrils daily.      omeprazole (PRILOSEC) 40 MG capsule Take 40 mg by mouth  daily.     traMADol (ULTRAM) 50 MG tablet Take 50 mg by mouth every 4 (four) hours as needed.     meloxicam  (MOBIC ) 15 MG tablet Take 1 tablet (15 mg total) by mouth daily. (Patient not taking: Reported on 03/03/2024) 30 tablet 1   No current facility-administered medications for this visit.   Allergies:   Wound dressing adhesive, Adalimumab, Amphetamine-dextroamphet er, Bupropion  hcl, Celecoxib, Sertraline hcl, Sulfamethoxazole -trimethoprim , and Tape   Social  History:  The patient  reports that she has never smoked. She has never used smokeless tobacco. She reports current alcohol use. She reports that she does not use drugs.   Family History:   family history includes ADD / ADHD in her son; Aneurysm in her mother; Atrial fibrillation in her brother; Breast cancer (age of onset: 90) in her maternal aunt; Cancer in her father and paternal grandfather; Colon cancer in her maternal uncle; Colon cancer (age of onset: 22) in her maternal grandmother; Depression in her brother; Diabetes in her paternal grandmother; Esophageal cancer (age of onset: 26) in her father; Hyperlipidemia in her brother and mother; Hypertension in her brother, mother, and sister; Kidney disease in her brother; Obesity in her sister; Stroke in her brother and mother; Thyroid  disease in her sister.   Review of Systems: Review of Systems  Constitutional: Negative.   HENT: Negative.    Respiratory: Negative.    Cardiovascular: Negative.   Gastrointestinal: Negative.   Musculoskeletal: Negative.   Neurological: Negative.   Psychiatric/Behavioral: Negative.    All other systems reviewed and are negative.  PHYSICAL EXAM: VS:  BP 120/80 (BP Location: Right Arm, Patient Position: Sitting, Cuff Size: Large)   Pulse 74   Ht 5\' 4"  (1.626 m)   Wt 230 lb (104.3 kg)   LMP 11/29/2011   SpO2 97%   BMI 39.48 kg/m  , BMI Body mass index is 39.48 kg/m. GEN: Well nourished, well developed, in no acute distress HEENT: normal Neck: no JVD, carotid bruits, or masses Cardiac: RRR; no murmurs, rubs, or gallops,no edema  Respiratory:  clear to auscultation bilaterally, normal work of breathing GI: soft, nontender, nondistended, + BS MS: no deformity or atrophy Skin: warm and dry, no rash Neuro:  Strength and sensation are intact Psych: euthymic mood, full affect  Recent Labs: 06/11/2023: BUN 16; Creatinine, Ser 0.62; Hemoglobin 13.7; Platelets 238; Potassium 3.8; Sodium 136   Lipid  Panel Lab Results  Component Value Date   CHOL 160 05/22/2018   HDL 48 (L) 05/22/2018   LDLCALC 93 05/22/2018   TRIG 98 05/22/2018     Wt Readings from Last 3 Encounters:  03/03/24 230 lb (104.3 kg)  06/11/23 235 lb (106.6 kg)  12/31/18 254 lb (115.2 kg)    ASSESSMENT AND PLAN:  Problem List Items Addressed This Visit       Cardiology Problems   Essential hypertension (Chronic)   Relevant Medications   ELIQUIS 5 MG TABS tablet   carvedilol (COREG) 6.25 MG tablet   Other Relevant Orders   EKG 12-Lead (Completed)   Atherosclerosis of native artery of extremity with intermittent claudication (HCC)   Relevant Medications   ELIQUIS 5 MG TABS tablet   carvedilol (COREG) 6.25 MG tablet   Other Relevant Orders   EKG 12-Lead (Completed)     Other   Morbid obesity (HCC) (Chronic)   Other Visit Diagnoses       PAF (paroxysmal atrial fibrillation) (HCC)    -  Primary   Relevant Medications  ELIQUIS 5 MG TABS tablet   carvedilol (COREG) 6.25 MG tablet   Other Relevant Orders   EKG 12-Lead (Completed)     Encounter for anticoagulation discussion and counseling          PAF: Rare episodes, coreg recently increased up to 6.25 BID Could consider higher dose, 12.5 twice daily Consider antiarrhythmics such as flecainide or propafenone for frequent episodes Possible sleep apnea, snoring better sleeping in recliner  Aorta atherosclerosis Minimal on CT scan 8/24 Prefers not to be on a statin, Lifestyle modification recommended  Obese We have encouraged continued exercise, careful diet management in an effort to lose weight.  Patient seen in consultation for Dr. Norberto Bear and will be referred back to his office for ongoing care of the issues detailed above  Signed, Juanda Noon, M.D., Ph.D. [facility] [place], [place] [phone]

## 2024-03-03 ENCOUNTER — Ambulatory Visit: Payer: 59 | Attending: Cardiovascular Disease | Admitting: Cardiovascular Disease

## 2024-03-03 VITALS — BP 120/80 | HR 74 | Ht 64.0 in | Wt 230.0 lb

## 2024-03-03 DIAGNOSIS — I480 Paroxysmal atrial fibrillation: Secondary | ICD-10-CM | POA: Diagnosis not present

## 2024-03-03 DIAGNOSIS — I10 Essential (primary) hypertension: Secondary | ICD-10-CM | POA: Diagnosis not present

## 2024-03-03 DIAGNOSIS — I70219 Atherosclerosis of native arteries of extremities with intermittent claudication, unspecified extremity: Secondary | ICD-10-CM

## 2024-03-03 DIAGNOSIS — Z7189 Other specified counseling: Secondary | ICD-10-CM

## 2024-03-03 NOTE — Patient Instructions (Addendum)
 Medication Instructions:  No changes  Hold Eliquis 2 days before knee surgery.   If you need a refill on your cardiac medications before your next appointment, please call your pharmacy.   Lab work: No new labs needed  Testing/Procedures: No new testing needed  Follow-Up: At [facility], you and your health needs are our priority.  As part of our continuing mission to provide you with exceptional heart care, we have created designated Provider Care Teams.  These Care Teams include your primary Cardiologist (physician) and Advanced Practice Providers (APPs -  Physician Assistants and Nurse Practitioners) who all work together to provide you with the care you need, when you need it.  You will need a follow up appointment in 12 months  Providers on your designated Care Team:   Laneta Pintos, NP Varney Gentleman, PA-C Cadence Gennaro Khat, [place]  COVID-19 Vaccine Information can be found at: [email or web address] For questions related to vaccine distribution or appointments, please email [email or web address] or call [phone].

## 2024-03-04 ENCOUNTER — Telehealth: Payer: Self-pay | Admitting: Cardiovascular Disease

## 2024-03-04 ENCOUNTER — Encounter: Payer: Self-pay | Admitting: Cardiovascular Disease

## 2024-03-04 NOTE — Telephone Encounter (Signed)
 Responded via mychart- advising that message was sent to preop team to review.   Thanks!

## 2024-03-04 NOTE — Telephone Encounter (Signed)
 Pt would like a c/b in regards to MyChart message from 5/6. Please advise

## 2024-03-05 ENCOUNTER — Telehealth: Payer: Self-pay

## 2024-03-05 NOTE — Telephone Encounter (Signed)
 Left message with Adine Ahmadi at [facility] and sports medicine to confirm proc date request stats 02/07/24.     Pre-operative Risk Assessment    Patient Name: Patricia [name]  DOB: April 21, 1961 MRN: [id number]   Date of last office visit: 03/03/24 Belva Boyden, MD Date of next office visit: NONE   Request for Surgical Clearance    Procedure:   RIGHT KNEE ARTHROSCOPY  Date of Surgery:  Clearance TBD                                Surgeon:  Alphonzo Ask, MD Surgeon's Group or Practice Name:  [facility] Phone number:  [phone] Fax number:  [phone] ATTN: DONNA MCCAIN   Type of Clearance Requested:   - Medical  - Pharmacy:  Hold Apixaban (Eliquis)     Type of Anesthesia:  Spinal   Additional requests/questions:    SignedCollin Deal   03/05/2024, 5:20 PM

## 2024-03-06 NOTE — Telephone Encounter (Signed)
 I s/w the surgery scheduler Abe Abed at Dr. Ana Balling office. We were wanting to confirm a date for the surgery. The original request came in 01/28/24 where CMA in our [place] office started what looks like to have been a clearance request for preop and Dr. Gollan; however she then noted error and never completed entering the clearance request. Abe Abed, understand this is why they never received the clearance back the then when originally sent. I apologized for the error on our end.   I assured Abe Abed that I will reach out to our preop APP to review if Dr. Gollan who just saw the pt 03/03/24 will be able to provide clearance. Abe Abed, stated they cannot schedule surgery until the pt has been cleared.

## 2024-03-06 NOTE — Telephone Encounter (Signed)
 Dr. Gollan,  You saw this patient on 5/5. Per office protocol, will you please comment on medical clearance for right knee arthroscopy?  Please route your response to P [facility] Preop. I will communicate with requesting office once you have given recommendations.   Thank you!  Morey Ar, NP

## 2024-03-06 NOTE — Telephone Encounter (Signed)
 Caller Abe Abed) returned Pre-op call.

## 2024-03-06 NOTE — Telephone Encounter (Signed)
 Please advise holding Eliquis prior to right knee arthroscopy.   Thank you!  DW

## 2024-03-06 NOTE — Telephone Encounter (Signed)
 Patient with diagnosis of A Fib on Eliquis for anticoagulation.    Procedure: RIGHT KNEE ARTHROSCOPY  Date of procedure: TBD   CHA2DS2-VASc Score = 3  This indicates a 3.2% annual risk of stroke. The patient's score is based upon: CHF History: 0 HTN History: 1 Diabetes History: 0 Stroke History: 0 Vascular Disease History: 1 Age Score: 0 Gender Score: 1   CrCl 109 ml/min Platelet count 245K  Per office protocol, patient can hold Eliquis for 3 days prior to procedure.    **This guidance is not considered finalized until pre-operative APP has relayed final recommendations.**

## 2024-03-07 NOTE — Telephone Encounter (Signed)
 See notes from pt says Dr. Gollan cleared you and told her to hold her eliquis x 2 days

## 2024-03-07 NOTE — Telephone Encounter (Signed)
   Name: Patricia [name]  DOB: Apr 08, 1961  MRN: [id number]   Primary Cardiologist: None  Chart reviewed as part of pre-operative protocol coverage. Patricia [name] was last seen on 03/03/2024 by Dr. Gollan.  Per Dr. Gollan "Acceptable risk for orthopedic surgery. No further cardiac testing needed."  Therefore, based on ACC/AHA guidelines, the patient would be an acceptable risk for the planned procedure without further cardiovascular testing.   Per Pharm D, patient may hold Eliquis for 3 days prior to procedure.    I will route this recommendation to the requesting party via Epic fax function and remove from pre-op pool. Please call with questions.  Morey Ar, NP 03/07/2024, 2:26 PM

## 2024-03-11 ENCOUNTER — Encounter: Payer: Self-pay | Admitting: Cardiovascular Disease

## 2024-04-09 ENCOUNTER — Encounter: Payer: Self-pay | Admitting: Cardiovascular Disease

## 2024-04-09 DIAGNOSIS — I4891 Unspecified atrial fibrillation: Secondary | ICD-10-CM

## 2024-04-15 NOTE — Telephone Encounter (Signed)
 Called and spoke with patient. Notified her of the following from Dr. Gollan.  We can refer to EP They can discuss various options with her including changing or adding additional medication including antiarrhythmic medication versus ablation -We need a baseline echocardiogram given there is not one on record, we need to rule out structural heart disease -Also can she detail whether she has sleep apnea or been tested for sleep apnea Thx TGollan  Patient verbalizes understanding. Patient states that she has never been tested for sleep apnea. Ordered placed for EP consult and Echocardiogram.

## 2024-04-23 ENCOUNTER — Other Ambulatory Visit: Payer: Self-pay | Admitting: Cardiovascular Disease

## 2024-04-24 NOTE — Telephone Encounter (Signed)
 Patient is following up. She would like to know if Dr. Gollan if unavailable, would another provider be able to advise on his behalf because she's almost out of medication and hasn't received any feedback.

## 2024-04-28 ENCOUNTER — Ambulatory Visit: Attending: Cardiovascular Disease

## 2024-04-28 ENCOUNTER — Other Ambulatory Visit: Payer: Self-pay | Admitting: Cardiovascular Disease

## 2024-04-28 DIAGNOSIS — I4891 Unspecified atrial fibrillation: Secondary | ICD-10-CM

## 2024-04-28 MED ORDER — CARVEDILOL 6.25 MG PO TABS: 6.2500 mg | ORAL_TABLET | Freq: Every day | ORAL | 3 refills | Status: AC | PRN

## 2024-04-28 MED ORDER — CARVEDILOL 12.5 MG PO TABS: 12.5000 mg | ORAL_TABLET | Freq: Two times a day (BID) | ORAL | 3 refills | Status: AC

## 2024-04-28 NOTE — Telephone Encounter (Signed)
 Called and spoke with patient regarding the following from Dr. Gollan.  We can send in the higher dose carvedilol 12.5 twice daily For breakthrough atrial fibrillation would recommend she take extra carvedilol 6.25 mg as needed Over the phone, perhaps have her complete STOP BANG questionnaire Would recommend sleep study if symptoms are there, score is high, watch PAT system if she qualifies Thx TGollan  Patient scored 2 on Stop Bang. Per Dr. Gollan will not order watch PAT at this time. Patient verbalizes understanding.

## 2024-04-29 LAB — ECHOCARDIOGRAM COMPLETE
AR max vel: 2.53 cm2
AV Area VTI: 2.63 cm2
AV Area mean vel: 2.46 cm2
AV Mean grad: 3 mmHg
AV Peak grad: 4.6 mmHg
Ao pk vel: 1.07 m/s
Area-P 1/2: 3.21 cm2
S' Lateral: 3.7 cm

## 2024-05-04 ENCOUNTER — Ambulatory Visit: Payer: Self-pay | Admitting: Cardiovascular Disease

## 2024-05-21 ENCOUNTER — Telehealth: Payer: Self-pay

## 2024-05-21 ENCOUNTER — Encounter: Payer: Self-pay | Admitting: Cardiology

## 2024-05-21 ENCOUNTER — Ambulatory Visit: Attending: Cardiology | Admitting: Cardiology

## 2024-05-21 VITALS — BP 115/57 | HR 68 | Ht 64.0 in | Wt 225.8 lb

## 2024-05-21 DIAGNOSIS — I10 Essential (primary) hypertension: Secondary | ICD-10-CM

## 2024-05-21 DIAGNOSIS — I480 Paroxysmal atrial fibrillation: Secondary | ICD-10-CM | POA: Diagnosis not present

## 2024-05-21 NOTE — Patient Instructions (Signed)
 Medication Instructions:  Your physician recommends that you continue on your current medications as directed. Please refer to the Current Medication list given to you today.  *If you need a refill on your cardiac medications before your next appointment, please call your pharmacy*  Testing/Procedures: Ablation Your physician has recommended that you have an ablation. Catheter ablation is a medical procedure used to treat some cardiac arrhythmias (irregular heartbeats). During catheter ablation, a long, thin, flexible tube is put into a blood vessel in your groin (upper thigh), or neck. This tube is called an ablation catheter. It is then guided to your heart through the blood vessel. Radio frequency waves destroy small areas of heart tissue where abnormal heartbeats may cause an arrhythmia to start.  Please call us  if you would like to schedule.   Follow-Up: At [facility], you and your health needs are our priority.  As part of our continuing mission to provide you with exceptional heart care, our providers are all part of one team.  This team includes your primary Cardiologist (physician) and Advanced Practice Providers or APPs (Physician Assistants and Nurse Practitioners) who all work together to provide you with the care you need, when you need it.

## 2024-05-21 NOTE — Progress Notes (Signed)
 Electrophysiology Office Note:    Date:  05/21/2024   ID:  Patricia [name], DOB [birth year], MRN [id number]  [facility] Cardiologist:  None  [facility] Electrophysiologist:  OLE ONEIDA HOLTS, MD   Referring MD: Perla Evalene PARAS, MD   Chief Complaint: Atrial fibrillation  History of Present Illness:    Patricia [name] is a 63 year old woman who I am seeing today for an evaluation of atrial fibrillation at the request of Dr. Gollan.  The patient has a history of peripheral vascular disease, hypertension, obesity, sleep apnea, anxiety.  She was last seen by Dr. Perla Mar 03, 2024.  At that appointment she reported episodes of symptomatic atrial fibrillation despite up titration of carvedilol .  She is doing well.  She feels fatigued and short of breath when in atrial fibrillation.  Her episodes of atrial fibrillation have become much more frequent recently.  She uses an Scientist, physiological to monitor for symptoms/rhythm.  She tolerates the Eliquis. She reports heavy snoring at night in the past.  This has improved since sleeping in a recliner.  Her atrial fibrillation episodes occur at night mostly.    Their past medical, social and family history was reviewed.   ROS:   Please see the history of present illness.    All other systems reviewed and are negative.  EKGs/Labs/Other Studies Reviewed:    The following studies were reviewed today:  April 29, 2024 echo EF 60-65 RV normal Mild MR  Mar 03, 2024 EKG shows sinus rhythm.  Normal intervals.  Referral notes from February 2025 reviewed.  ZIO monitor ([employer]) demonstrates 8% burden of atrial fibrillation.  These episodes were symptom triggered.       Physical Exam:    VS:  BP (!) 115/57 (BP Location: Left Arm, Patient Position: Sitting, Cuff Size: Normal)   Pulse 68   Ht 5' 4 (1.626 m)   Wt 225 lb 12.8 oz (102.4 kg)   LMP 11/29/2011   SpO2 98%   BMI 38.76 kg/m     Wt Readings from Last 3 Encounters:  05/21/24 225 lb 12.8 oz  (102.4 kg)  03/03/24 230 lb (104.3 kg)  06/11/23 235 lb (106.6 kg)     GEN: no distress CARD: RRR, No MRG RESP: No IWOB. CTAB.        ASSESSMENT AND PLAN:    1. PAF (paroxysmal atrial fibrillation) (HCC)   2. Essential hypertension     #Atrial fibrillation Paroxysmal.  Symptomatic. On Eliquis for stroke prophylaxis.  Discussed treatment options today for AF including antiarrhythmic drug therapy (flecainide, Multaq, Tikosyn, amiodarone) and ablation. Discussed risks, recovery and likelihood of success with each treatment strategy. Risk, benefits, and alternatives to EP study and ablation for afib were discussed. These risks include but are not limited to stroke, bleeding, vascular damage, tamponade, perforation, damage to the esophagus, lungs, phrenic nerve and other structures, pulmonary vein stenosis, worsening renal function, coronary vasospasm and death.  Discussed potential need for repeat ablation procedures and antiarrhythmic drugs after an initial ablation.  I think about her options before letting us  know how she wants to proceed.  If she elects to proceed, Carto, ICE, anesthesia are requested for the procedure.  We will not obtain CT PV protocol prior to the procedure.  #Hypertension At goal today.  Recommend checking blood pressures 1-2 times per week at home and recording the values.  Recommend bringing these recordings to the primary care physician.  #Suspected sleep apnea Heavy snoring burden.  Nocturnal atrial fibrillation  episodes.  Recommend home sleep study.    Signed, Ole DASEN. Cindie, MD, [facility], [facility] 05/21/2024 11:43 AM    Electrophysiology [facility]

## 2024-05-21 NOTE — Telephone Encounter (Signed)
 Patient agreement reviewed and signed on 05/21/2024.  WatchPAT issued to patient on 05/21/2024 by Vivianna Piccini Chauvigne, RN. Patient aware to not open the WatchPAT box until contacted with the activation PIN. Patient profile initialized in CloudPAT on 05/21/2024 by Sadiya Durand Chauvigne, RN. Device serial number: [id number]  Please list Reason for Call as Advice Only and type WatchPAT issued to patient in the comment box.

## 2024-05-23 ENCOUNTER — Telehealth: Payer: Self-pay

## 2024-05-23 NOTE — Telephone Encounter (Signed)
**Note De-Identified Lauralei Clouse Obfuscation** See phone note (Itamar-HST PA) from 7/25 in chart for updates.

## 2024-05-23 NOTE — Telephone Encounter (Signed)
**Note De-Identified Kennie Karapetian Obfuscation** Ordering provider: Dr Cindie Associated diagnoses: Snoring-R06.83 and Nocturnal atrial fibrillation episodes-I48.91  WatchPAT PA obtained on 05/23/2024 by Vergie Zahm, Avelina HERO, LPN. Authorization: Per the [employer] website:  Patient notified of PIN (1234) on 05/23/2024 Doniel Maiello Notification Method: phone.  Phone note routed to covering staff for follow-up.

## 2024-05-29 ENCOUNTER — Encounter (INDEPENDENT_AMBULATORY_CARE_PROVIDER_SITE_OTHER): Admitting: Cardiology

## 2024-05-29 DIAGNOSIS — I480 Paroxysmal atrial fibrillation: Secondary | ICD-10-CM

## 2024-05-29 DIAGNOSIS — R0683 Snoring: Secondary | ICD-10-CM

## 2024-06-03 ENCOUNTER — Ambulatory Visit: Attending: Cardiology

## 2024-06-03 DIAGNOSIS — I480 Paroxysmal atrial fibrillation: Secondary | ICD-10-CM

## 2024-06-03 DIAGNOSIS — I10 Essential (primary) hypertension: Secondary | ICD-10-CM

## 2024-06-03 NOTE — Procedures (Signed)
   SLEEP STUDY REPORT Patient Information Study Date: 05/29/2024 Patient Name: Patricia [name] Patient ID: [id number] Birth Date: 1961/09/17 Age: 63 Gender: Female BMI: 39.1 (W=229 lb, H=5' 4'') Referring Physician: Ole Holts, MD  TEST DESCRIPTION: Home sleep apnea testing was completed using the WatchPat, a Type 1 device, utilizing  peripheral arterial tonometry (PAT), chest movement, actigraphy, pulse oximetry, pulse rate, body position and snore.  AHI was calculated with apnea and hypopnea using valid sleep time as the denominator. RDI includes apneas,  hypopneas, and RERAs. The data acquired and the scoring of sleep and all associated events were performed in  accordance with the recommended standards and specifications as outlined in the AASM Manual for the Scoring of  Sleep and Associated Events 2.2.0 (2015).  FINDINGS: 1. No evidence of Obstructive Sleep Apnea with AHI 4.7/hr.  2. No Central Sleep Apnea. 3. Oxygen desaturations as low as 86%. 4. Mild snoring was present. O2 sats were < 88% for 0.4 minutes. 5. Total sleep time was 6 hrs and 47 min. 6. 37.7% of total sleep time was spent in REM sleep.  7. Normal sleep onset latency at 14 min.  8. Shortened REM sleep onset latency at 50 min.  9. Total awakenings were 3.   DIAGNOSIS:  Normal study with no significant sleep disordered breathing.  RECOMMENDATIONS: 1. Normal study with no significant sleep disordered breathing. 2. Healthy sleep recommendations include: adequate nightly sleep (normal 7-9 hrs/night), avoidance of caffeine after  noon and alcohol near bedtime, and maintaining a sleep environment that is cool, dark and quiet. 3. Weight loss for overweight patients is recommended.  4. Snoring recommendations include: weight loss where appropriate, side sleeping, and avoidance of alcohol before  bed. 5. Operation of motor vehicle or dangerous equipment must be avoided when feeling drowsy, excessively sleepy, or   mentally fatigued.  6. An ENT consultation which may be useful for specific causes of and possible treatment of bothersome snoring .  7. Weight loss may be of benefit in reducing the severity of snoring.   Signature: Wilbert Bihari, MD; [facility]; Diplomat, American Board of Sleep  Medicine Electronically Signed: 06/03/2024 11:04:05 AM

## 2024-06-05 ENCOUNTER — Telehealth: Payer: Self-pay | Admitting: *Deleted

## 2024-06-05 NOTE — Telephone Encounter (Signed)
 The patient has been notified of the result and verbalized understanding.  All questions (if any) were answered. [name], CMA 06/05/2024 4:14 PM     Pt is agreeable to normal results.

## 2024-06-05 NOTE — Telephone Encounter (Signed)
-----   Message from Wilbert Bihari sent at 06/03/2024 11:05 AM EDT ----- Please let patient know that sleep study showed no significant sleep apnea.

## 2024-06-16 ENCOUNTER — Encounter: Payer: Self-pay | Admitting: Cardiology

## 2024-06-16 DIAGNOSIS — I480 Paroxysmal atrial fibrillation: Secondary | ICD-10-CM

## 2024-07-15 ENCOUNTER — Telehealth: Payer: Self-pay | Admitting: Cardiovascular Disease

## 2024-07-15 NOTE — Telephone Encounter (Signed)
 Called and spoke with patient. Notified her that the instructions for labs is to have it drawn within 30 days of procedure. Patient verbalizes understanding.

## 2024-07-15 NOTE — Telephone Encounter (Signed)
 Pt is following up on whether lab orders ever got sent to [facility] re her MyChart messages.

## 2024-07-31 ENCOUNTER — Telehealth (HOSPITAL_COMMUNITY): Payer: Self-pay

## 2024-07-31 ENCOUNTER — Encounter (HOSPITAL_COMMUNITY): Payer: Self-pay

## 2024-07-31 NOTE — Telephone Encounter (Signed)
 Spoke with patient to complete pre-procedure call.     Health status review:  Any new medical conditions, recent signs of acute illness or been started on antibiotics? No Any recent hospitalizations or surgeries? No Any new medications started since pre-op visit? No  Follow all medication instructions prior to procedure or the procedure may be rescheduled:    Continue taking Eliquis (Apixaban) twice daily without missing any doses before procedure. Essential chronic medications:  No medication should be continued, unless told otherwise. On the morning of your procedure DO NOT take any medication., including Eliquis (Apixaban).  Nothing to eat or drink after midnight prior to your procedure.  Pre-procedure testing scheduled: lab work on October 3.  Confirmed patient is scheduled for Atrial Fibrillation Ablation on Thursday, October 23 with Dr. Ole Holts. Instructed patient to arrive at the Main Entrance A at [facility]: [address] [place], [place] [address] and check in at Admitting at 5:30 AM.  Advised of plan to go home the same day and will only stay overnight if medically necessary. You MUST have a responsible adult to drive you home and MUST be with you the first 24 hours after you arrive home or your procedure could be cancelled.  Informed patient a nurse will call a day before the procedure to confirm arrival time and ensure instructions are followed.  Patient verbalized understanding to information provided and is agreeable to proceed with procedure.   Advised patient to contact RN Navigator at [phone], to inform of any new medications started after call or concerns prior to procedure.

## 2024-08-01 ENCOUNTER — Ambulatory Visit (HOSPITAL_COMMUNITY)
Admission: RE | Admit: 2024-08-01 | Discharge: 2024-08-01 | Disposition: A | Discharge status: Home / Self Care | Source: Ambulatory Visit | Attending: Physician Assistant | Admitting: Physician Assistant

## 2024-08-01 VITALS — BP 130/80 | HR 76 | Ht 64.0 in | Wt 232.2 lb

## 2024-08-01 DIAGNOSIS — I480 Paroxysmal atrial fibrillation: Secondary | ICD-10-CM

## 2024-08-01 DIAGNOSIS — D6869 Other thrombophilia: Secondary | ICD-10-CM

## 2024-08-01 DIAGNOSIS — I4891 Unspecified atrial fibrillation: Secondary | ICD-10-CM

## 2024-08-01 LAB — BASIC METABOLIC PANEL WITH GFR
BUN/Creatinine Ratio: 25 (ref 12–28)
BUN: 15 mg/dL (ref 8–27)
CO2: 24 mmol/L (ref 20–29)
Calcium: 9.4 mg/dL (ref 8.7–10.3)
Chloride: 104 mmol/L (ref 96–106)
Creatinine, Ser: 0.61 mg/dL (ref 0.57–1.00)
Glucose: 106 mg/dL — ABNORMAL HIGH (ref 70–99)
Potassium: 4.6 mmol/L (ref 3.5–5.2)
Sodium: 142 mmol/L (ref 134–144)
eGFR: 100 mL/min/1.73 (ref >59)

## 2024-08-01 LAB — CBC
Hematocrit: 44.7 % (ref 34.0–46.6)
Hemoglobin: 14.9 g/dL (ref 11.1–15.9)
MCH: 31.4 pg (ref 26.6–33.0)
MCHC: 33.3 g/dL (ref 31.5–35.7)
MCV: 94 fL (ref 79–97)
Platelets: 263 x10E3/uL (ref 150–450)
RBC: 4.75 x10E6/uL (ref 3.77–5.28)
RDW: 12.1 % (ref 11.7–15.4)
WBC: 7.8 x10E3/uL (ref 3.4–10.8)

## 2024-08-01 MED ORDER — FLECAINIDE ACETATE 50 MG PO TABS: 50.0000 mg | ORAL_TABLET | Freq: Two times a day (BID) | ORAL | 3 refills | Status: DC

## 2024-08-01 NOTE — Patient Instructions (Signed)
 Start Flecainide 50mg  twice a day

## 2024-08-01 NOTE — Progress Notes (Signed)
 Primary Care Physician: Diedra Lame, MD Primary Cardiologist: None Electrophysiologist: OLE ONEIDA HOLTS, MD  Referring Physician: Dr HOLTS Romero JULIANNA Patricia [name] is a 63 y.o. female with a history of HTN, Sjogren's, atrial fibrillation who presents for follow up in the [facility].  The patient has had paroxysms of afib first detected on her Apple Watch. She as started on Eliquis for stroke prevention and has been maintained on carvedilol . She was seen by Dr HOLTS and is scheduled for afib ablation on 08/21/24.    Patient presents today for follow up for atrial fibrillation. She reports that she has had much more frequent episodes of afib, now occurring every 2-3 days. 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 No bleeding issues on anticoagulation.   Today, she denies symptoms of chest pain, shortness of breath, orthopnea, PND, lower extremity edema, dizziness, presyncope, syncope, snoring, daytime somnolence, bleeding, or neurologic sequela. The patient is tolerating medications without difficulties and is otherwise without complaint today.    Atrial Fibrillation Risk Factors:  she does not have symptoms or diagnosis of sleep apnea. Negative sleep study 05/29/24. she does not have a history of rheumatic fever. she does have a history of alcohol use. The patient does have a history of early familial atrial fibrillation or other arrhythmias.  Atrial Fibrillation Management history:  Previous antiarrhythmic drugs: none Previous cardioversions: none Previous ablations: none Anticoagulation history: Eliquis  ROS- All systems are reviewed and negative except as per the HPI above.  Past Medical History:  Diagnosis Date   ADD (attention deficit disorder)    Allergic rhinitis    Anxiety    Chronic sinusitis    Complication of anesthesia    Degenerative disc disease    Depression    Gastritis    GERD  (gastroesophageal reflux disease)    Hidradenitis suppurativa    Hx MRSA infection    PT STATES PCP TREATED HER WITH MUPIRICON FOR WHAT HE THOUGHT WAS MRSA > 1 YEAR AGO    Hypertension    H/O IN 2015-LOST 80 IBS AND WENT OFF MEDS  THEN DUE TO CONTROL   Laryngopharyngeal reflux (LPR)    MRSA nasal colonization 06/15/2012   Obesity    PONV (postoperative nausea and vomiting)    AFTER BACK SURGERIES   Rectal bleeding    Followed by GI    Rosacea    Sjogren's disease     Current Outpatient Medications  Medication Sig Dispense Refill   carvedilol  (COREG ) 12.5 MG tablet Take 1 tablet (12.5 mg total) by mouth 2 (two) times daily with a meal. 180 tablet 3   carvedilol  (COREG ) 6.25 MG tablet Take 1 tablet (6.25 mg total) by mouth daily as needed. 90 tablet 3   dexlansoprazole  (DEXILANT ) 60 MG capsule Take 1 capsule by mouth daily.     ELIQUIS 5 MG TABS tablet Take 5 mg by mouth 2 (two) times daily.     fluticasone  (FLONASE  SENSIMIST) 27.5 MCG/SPRAY nasal spray Place 1 spray into the nose daily.     Vitamin D , Ergocalciferol , (DRISDOL ) 1.25 MG (50000 UNIT) CAPS capsule Take 50,000 Units by mouth once a week.     No current facility-administered medications for this encounter.    Physical Exam: BP 130/80   Pulse 76   Ht 5' 4 (1.626 m)   Wt 105.3 kg   LMP 11/29/2011   BMI 39.86 kg/m   GEN: Well nourished,  well developed in no acute distress CARDIAC: Regular rate and rhythm, no murmurs, rubs, gallops RESPIRATORY:  Clear to auscultation without rales, wheezing or rhonchi  ABDOMEN: Soft, non-tender, non-distended EXTREMITIES:  No edema; No deformity   Wt Readings from Last 3 Encounters:  08/01/24 105.3 kg  05/21/24 102.4 kg  03/03/24 104.3 kg     EKG today demonstrates  SR Vent. rate 76 BPM PR interval 152 ms QRS duration 74 ms QT/QTcB 366/411 ms   Echo 04/28/24 demonstrated   1. Left ventricular ejection fraction, by estimation, is 60 to 65%. The  left ventricle has  normal function. The left ventricle has no regional  wall motion abnormalities. Left ventricular diastolic parameters were  normal. The average left ventricular global longitudinal strain is -18.0 %. The global longitudinal strain is normal.   2. Right ventricular systolic function is normal. The right ventricular  size is normal.   3. The mitral valve is normal in structure. Mild mitral valve  regurgitation.   4. The aortic valve is tricuspid. Aortic valve regurgitation is not  visualized.   5. The inferior vena cava is normal in size with greater than 50%  respiratory variability, suggesting right atrial pressure of 3 mmHg.    CHA2DS2-VASc Score = 3  The patient's score is based upon: CHF History: 0 HTN History: 1 Diabetes History: 0 Stroke History: 0 Vascular Disease History: 1 Age Score: 0 Gender Score: 1       ASSESSMENT AND PLAN: Paroxysmal Atrial Fibrillation (ICD10:  I48.0) The patient's CHA2DS2-VASc score is 3, indicating a 3.2% annual risk of stroke.   Patient in SR today but having much more frequent afib.  She is scheduled for afib ablation on 08/21/24.  After discussing the risks and benefits, will plan to start flecainide 50 mg BID as a bridge to ablation. Can increase to 100 mg BID if she still has frequent episodes.  Continue Eliquis 5 mg BID Continue carvedilol  12.5 mg BID  Secondary Hypercoagulable State (ICD10:  D68.69) The patient is at significant risk for stroke/thromboembolism based upon her CHA2DS2-VASc Score of 3.  Continue Apixaban (Eliquis). No bleeding issues.  HTN Stable on current regimen   Follow up in the AF clinic in 5-7 days for ECG.    [facility] [address] [place], [place] [address] [phone]

## 2024-08-07 ENCOUNTER — Ambulatory Visit (HOSPITAL_COMMUNITY)
Admission: RE | Admit: 2024-08-07 | Discharge: 2024-08-07 | Disposition: A | Discharge status: Home / Self Care | Source: Ambulatory Visit | Attending: Physician Assistant | Admitting: Physician Assistant

## 2024-08-07 VITALS — HR 64

## 2024-08-07 DIAGNOSIS — I4891 Unspecified atrial fibrillation: Secondary | ICD-10-CM

## 2024-08-07 NOTE — Progress Notes (Signed)
 Patient returns for ECG after starting flecainide. ECG shows:  SR Vent. rate 64 BPM PR interval 168 ms QRS duration 78 ms QT/QTcB 386/398 ms   She states she has not had any afib since starting flecainide. Follow up for afib ablation as scheduled.

## 2024-08-11 ENCOUNTER — Encounter: Payer: Self-pay | Admitting: Cardiology

## 2024-08-13 ENCOUNTER — Encounter: Payer: Self-pay | Admitting: Cardiology

## 2024-08-18 ENCOUNTER — Other Ambulatory Visit (HOSPITAL_COMMUNITY): Payer: Self-pay | Admitting: *Deleted

## 2024-08-18 ENCOUNTER — Encounter: Payer: Self-pay | Admitting: Cardiology

## 2024-08-18 MED ORDER — FLECAINIDE ACETATE 100 MG PO TABS: 100.0000 mg | ORAL_TABLET | Freq: Two times a day (BID) | ORAL | 1 refills | Status: DC

## 2024-08-19 NOTE — Anesthesia Preprocedure Evaluation (Signed)
 Anesthesia Evaluation  Patient identified by MRN, date of birth, ID band Patient awake    Reviewed: Allergy & Precautions, NPO status , Patient's Chart, lab work & pertinent test results, reviewed documented beta blocker date and time   History of Anesthesia Complications (+) PONV and history of anesthetic complications (w/ back surgeries)  Airway Mallampati: III  TM Distance: >3 FB Neck ROM: Full    Dental  (+) Dental Advisory Given, Teeth Intact   Pulmonary neg pulmonary ROS   breath sounds clear to auscultation       Cardiovascular hypertension, Pt. on medications and Pt. on home beta blockers + Peripheral Vascular Disease  + dysrhythmias (eliquis) Atrial Fibrillation  Rhythm:Regular     Neuro/Psych  PSYCHIATRIC DISORDERS Anxiety Depression    negative neurological ROS     GI/Hepatic Neg liver ROS,GERD  ,,  Endo/Other    Class 3 obesity (BMI 40)  Renal/GU negative Renal ROS     Musculoskeletal  (+) Arthritis , Osteoarthritis,  Hx multiple back surgeries    Abdominal   Peds  Hematology  (+) Blood dyscrasia Lab Results      Component                Value               Date                      WBC                      7.8                 07/31/2024                HGB                      14.9                07/31/2024                HCT                      44.7                07/31/2024                MCV                      94                  07/31/2024                PLT                      263                 07/31/2024            eliquis   Anesthesia Other Findings Sjogrens   Reproductive/Obstetrics                              Anesthesia Physical Anesthesia Plan  ASA: 3  Anesthesia Plan: General   Post-op Pain Management: Minimal or no pain anticipated   Induction: Intravenous  PONV Risk Score and Plan: 4 or greater and Ondansetron , Dexamethasone, Treatment may vary  due  to age or medical condition, Propofol  infusion and TIVA  Airway Management Planned: Oral ETT  Additional Equipment: None  Intra-op Plan:   Post-operative Plan: Extubation in OR  Informed Consent: I have reviewed the patients History and Physical, chart, labs and discussed the procedure including the risks, benefits and alternatives for the proposed anesthesia with the patient or authorized representative who has indicated his/her understanding and acceptance.     Dental advisory given  Plan Discussed with: CRNA  Anesthesia Plan Comments: (Last airway note: Ventilation: Mask ventilation without difficulty Laryngoscope Size: Mac and 3 Grade View: Grade II Tube type: Oral Tube size: 7.0 mm Number of attempts: 1 )         Anesthesia Quick Evaluation

## 2024-08-20 NOTE — Pre-Procedure Instructions (Signed)
 Instructed patient on the following items: Arrival time 0515 Nothing to eat or drink after midnight No meds AM of procedure Responsible person to drive you home and stay with you for 24 hrs  Have you missed any doses of anti-coagulant Eliquis- takes twice a day,  Missed dose on 10/14.  Don't take dose morning of procedure.  Will notify Dr Cindie,  will do EKG morning of procedure.

## 2024-08-21 ENCOUNTER — Other Ambulatory Visit: Payer: Self-pay

## 2024-08-21 ENCOUNTER — Ambulatory Visit (HOSPITAL_BASED_OUTPATIENT_CLINIC_OR_DEPARTMENT_OTHER): Payer: Self-pay | Admitting: Anesthesiology

## 2024-08-21 ENCOUNTER — Ambulatory Visit (HOSPITAL_COMMUNITY): Payer: Self-pay | Admitting: Anesthesiology

## 2024-08-21 ENCOUNTER — Other Ambulatory Visit (HOSPITAL_COMMUNITY): Payer: Self-pay

## 2024-08-21 ENCOUNTER — Ambulatory Visit (HOSPITAL_COMMUNITY)
Admission: RE | Admit: 2024-08-21 | Discharge: 2024-08-21 | Disposition: A | Attending: Cardiology | Admitting: Cardiology

## 2024-08-21 ENCOUNTER — Ambulatory Visit (HOSPITAL_COMMUNITY)
Admission: RE | Disposition: A | Discharge status: Home / Self Care | Payer: Self-pay | Source: Home / Self Care | Attending: Cardiology

## 2024-08-21 DIAGNOSIS — E66813 Obesity, class 3: Secondary | ICD-10-CM | POA: Diagnosis not present

## 2024-08-21 DIAGNOSIS — R0683 Snoring: Secondary | ICD-10-CM | POA: Diagnosis not present

## 2024-08-21 DIAGNOSIS — I4891 Unspecified atrial fibrillation: Secondary | ICD-10-CM

## 2024-08-21 DIAGNOSIS — Z6839 Body mass index (BMI) 39.0-39.9, adult: Secondary | ICD-10-CM

## 2024-08-21 DIAGNOSIS — I480 Paroxysmal atrial fibrillation: Secondary | ICD-10-CM

## 2024-08-21 DIAGNOSIS — I739 Peripheral vascular disease, unspecified: Secondary | ICD-10-CM | POA: Diagnosis not present

## 2024-08-21 DIAGNOSIS — I10 Essential (primary) hypertension: Secondary | ICD-10-CM | POA: Diagnosis not present

## 2024-08-21 DIAGNOSIS — Z6841 Body Mass Index (BMI) 40.0 and over, adult: Secondary | ICD-10-CM | POA: Insufficient documentation

## 2024-08-21 DIAGNOSIS — Z7901 Long term (current) use of anticoagulants: Secondary | ICD-10-CM | POA: Diagnosis not present

## 2024-08-21 HISTORY — PX: ATRIAL FIBRILLATION ABLATION: EP1191

## 2024-08-21 LAB — POCT ACTIVATED CLOTTING TIME: Activated Clotting Time: 366 s

## 2024-08-21 MED ORDER — PANTOPRAZOLE SODIUM 40 MG PO TBEC
40.0000 mg | DELAYED_RELEASE_TABLET | Freq: Every day | ORAL | 0 refills | Status: AC
  Filled 2024-08-21 (×2): qty 45, 45d supply, fill #0

## 2024-08-21 MED ORDER — PHENYLEPHRINE HCL-NACL 20-0.9 MG/250ML-% IV SOLN
INTRAVENOUS | Status: DC | PRN
  Administered 2024-08-21: 20 ug/min via INTRAVENOUS

## 2024-08-21 MED ORDER — SODIUM CHLORIDE 0.9 % IV SOLN: 250.0000 mL | INTRAVENOUS | Status: DC | PRN

## 2024-08-21 MED ORDER — ACETAMINOPHEN 325 MG PO TABS
650.0000 mg | ORAL_TABLET | ORAL | Status: DC | PRN
  Administered 2024-08-21: 650 mg via ORAL

## 2024-08-21 MED ORDER — FENTANYL CITRATE (PF) 250 MCG/5ML IJ SOLN
INTRAMUSCULAR | Status: DC | PRN
  Administered 2024-08-21 (×2): 50 ug via INTRAVENOUS

## 2024-08-21 MED ORDER — FENTANYL CITRATE (PF) 100 MCG/2ML IJ SOLN
INTRAMUSCULAR | Status: AC
  Filled 2024-08-21: qty 2

## 2024-08-21 MED ORDER — PROPOFOL 10 MG/ML IV BOLUS
INTRAVENOUS | Status: DC | PRN
  Administered 2024-08-21: 110 mg via INTRAVENOUS

## 2024-08-21 MED ORDER — EPHEDRINE SULFATE-NACL 50-0.9 MG/10ML-% IV SOSY
PREFILLED_SYRINGE | INTRAVENOUS | Status: DC | PRN
  Administered 2024-08-21: 15 mg via INTRAVENOUS

## 2024-08-21 MED ORDER — HEPARIN SODIUM (PORCINE) 1000 UNIT/ML IJ SOLN
INTRAMUSCULAR | Status: DC | PRN
Start: 2024-08-21 — End: 2024-08-21
  Administered 2024-08-21: 16000 [IU] via INTRAVENOUS

## 2024-08-21 MED ORDER — ONDANSETRON HCL 4 MG/2ML IJ SOLN: 4.0000 mg | Freq: Four times a day (QID) | INTRAMUSCULAR | Status: DC | PRN

## 2024-08-21 MED ORDER — HEPARIN (PORCINE) IN NACL 1000-0.9 UT/500ML-% IV SOLN
INTRAVENOUS | Status: DC | PRN
  Administered 2024-08-21 (×3): 500 mL

## 2024-08-21 MED ORDER — LIDOCAINE 2% (20 MG/ML) 5 ML SYRINGE
INTRAMUSCULAR | Status: DC | PRN
  Administered 2024-08-21: 60 mg via INTRAVENOUS

## 2024-08-21 MED ORDER — PROTAMINE SULFATE 10 MG/ML IV SOLN
INTRAVENOUS | Status: DC | PRN
  Administered 2024-08-21: 35 mg via INTRAVENOUS

## 2024-08-21 MED ORDER — ATROPINE SULFATE 1 MG/10ML IJ SOSY
PREFILLED_SYRINGE | INTRAMUSCULAR | Status: AC
  Filled 2024-08-21: qty 10

## 2024-08-21 MED ORDER — COLCHICINE 0.6 MG PO TABS
0.6000 mg | ORAL_TABLET | Freq: Two times a day (BID) | ORAL | Status: DC
  Administered 2024-08-21: 0.6 mg via ORAL
  Filled 2024-08-21 (×2): qty 1

## 2024-08-21 MED ORDER — ACETAMINOPHEN 500 MG PO TABS
1000.0000 mg | ORAL_TABLET | Freq: Once | ORAL | Status: AC
  Administered 2024-08-21: 1000 mg via ORAL
  Filled 2024-08-21: qty 2

## 2024-08-21 MED ORDER — SODIUM CHLORIDE 0.9% FLUSH: 3.0000 mL | Freq: Two times a day (BID) | INTRAVENOUS | Status: DC

## 2024-08-21 MED ORDER — SODIUM CHLORIDE 0.9% FLUSH
3.0000 mL | INTRAVENOUS | Status: DC | PRN
Start: 2024-08-21 — End: 2024-08-21

## 2024-08-21 MED ORDER — PHENYLEPHRINE 80 MCG/ML (10ML) SYRINGE FOR IV PUSH (FOR BLOOD PRESSURE SUPPORT)
PREFILLED_SYRINGE | INTRAVENOUS | Status: DC | PRN
  Administered 2024-08-21: 80 ug via INTRAVENOUS

## 2024-08-21 MED ORDER — DEXAMETHASONE SOD PHOSPHATE PF 10 MG/ML IJ SOLN
INTRAMUSCULAR | Status: DC | PRN
  Administered 2024-08-21: 10 mg via INTRAVENOUS

## 2024-08-21 MED ORDER — COLCHICINE 0.6 MG PO TABS
0.6000 mg | ORAL_TABLET | Freq: Two times a day (BID) | ORAL | 0 refills | Status: DC
  Filled 2024-08-21: qty 10, 5d supply, fill #0

## 2024-08-21 MED ORDER — PROPOFOL 500 MG/50ML IV EMUL
INTRAVENOUS | Status: DC | PRN
  Administered 2024-08-21: 125 ug/kg/min via INTRAVENOUS

## 2024-08-21 MED ORDER — MIDAZOLAM HCL (PF) 2 MG/2ML IJ SOLN
INTRAMUSCULAR | Status: DC | PRN
Start: 2024-08-21 — End: 2024-08-21
  Administered 2024-08-21: 2 mg via INTRAVENOUS

## 2024-08-21 MED ORDER — LACTATED RINGERS IV SOLN: INTRAVENOUS | Status: DC | PRN

## 2024-08-21 MED ORDER — ROCURONIUM BROMIDE 10 MG/ML (PF) SYRINGE
PREFILLED_SYRINGE | INTRAVENOUS | Status: DC | PRN
Start: 2024-08-21 — End: 2024-08-21
  Administered 2024-08-21: 80 mg via INTRAVENOUS

## 2024-08-21 MED ORDER — PANTOPRAZOLE SODIUM 40 MG PO TBEC
40.0000 mg | DELAYED_RELEASE_TABLET | Freq: Every day | ORAL | Status: DC
  Administered 2024-08-21: 40 mg via ORAL
  Filled 2024-08-21 (×2): qty 1

## 2024-08-21 MED ORDER — ONDANSETRON HCL 4 MG/2ML IJ SOLN
INTRAMUSCULAR | Status: DC | PRN
  Administered 2024-08-21: 4 mg via INTRAVENOUS

## 2024-08-21 MED ORDER — ATROPINE SULFATE 1 MG/10ML IJ SOSY
PREFILLED_SYRINGE | INTRAMUSCULAR | Status: DC | PRN
  Administered 2024-08-21: 1 mg via INTRAVENOUS

## 2024-08-21 MED ORDER — SUGAMMADEX SODIUM 200 MG/2ML IV SOLN
INTRAVENOUS | Status: DC | PRN
  Administered 2024-08-21: 200 mg via INTRAVENOUS

## 2024-08-21 MED ORDER — ACETAMINOPHEN 325 MG PO TABS
ORAL_TABLET | ORAL | Status: DC
Start: 2024-08-21 — End: 2024-08-21
  Filled 2024-08-21: qty 2

## 2024-08-21 MED ORDER — SODIUM CHLORIDE 0.9 % IV SOLN: INTRAVENOUS | Status: DC

## 2024-08-21 MED ORDER — MIDAZOLAM HCL 2 MG/2ML IJ SOLN
INTRAMUSCULAR | Status: AC
  Filled 2024-08-21: qty 2

## 2024-08-21 MED ORDER — APIXABAN 5 MG PO TABS
5.0000 mg | ORAL_TABLET | Freq: Two times a day (BID) | ORAL | Status: DC
  Administered 2024-08-21: 5 mg via ORAL
  Filled 2024-08-21: qty 1

## 2024-08-21 NOTE — Anesthesia Procedure Notes (Signed)
 Procedure Name: Intubation Date/Time: 08/21/2024 8:04 AM  Performed by: Jerl Donald LABOR, CRNAPre-anesthesia Checklist: Patient identified, Emergency Drugs available, Suction available and Patient being monitored Patient Re-evaluated:Patient Re-evaluated prior to induction Oxygen Delivery Method: Circle System Utilized Preoxygenation: Pre-oxygenation with 100% oxygen Induction Type: IV induction Ventilation: Mask ventilation without difficulty Laryngoscope Size: Mac and 3 Grade View: Grade II Tube type: Oral Tube size: 7.0 mm Number of attempts: 1 Airway Equipment and Method: Stylet Placement Confirmation: ETT inserted through vocal cords under direct vision, positive ETCO2 and breath sounds checked- equal and bilateral Secured at: 22 cm Tube secured with: Tape Dental Injury: Teeth and Oropharynx as per pre-operative assessment

## 2024-08-21 NOTE — Anesthesia Postprocedure Evaluation (Signed)
 Anesthesia Post Note  Patient: Patricia [name]  Procedure(s) Performed: ATRIAL FIBRILLATION ABLATION     Patient location during evaluation: PACU Anesthesia Type: General Level of consciousness: awake and alert, oriented and patient cooperative Pain management: pain level controlled Vital Signs Assessment: post-procedure vital signs reviewed and stable Respiratory status: spontaneous breathing, nonlabored ventilation and respiratory function stable Cardiovascular status: blood pressure returned to baseline and stable Postop Assessment: no apparent nausea or vomiting Anesthetic complications: no   No notable events documented.  Last Vitals:  Vitals:   08/21/24 1015 08/21/24 1030  BP: 121/64 119/61  Pulse: 67 65  Resp: 17 14  Temp:    SpO2: (!) 88% (!) 89%    Last Pain:  Vitals:   08/21/24 0949  TempSrc:   PainSc: 0-No pain                 Almarie CHRISTELLA Marchi

## 2024-08-21 NOTE — Discharge Instructions (Addendum)
 Femoral Site Care This sheet gives you information about how to care for yourself after your procedure. Your health care provider may also give you more specific instructions. If you have problems or questions, contact your health care provider. What can I expect after the procedure?  After the procedure, it is common to have: Bruising that usually fades within 1-2 weeks. Tenderness at the site. Follow these instructions at home: Wound care Follow instructions from your health care provider about how to take care of your insertion site. Make sure you: Wash your hands with soap and water before you change your bandage (dressing). If soap and water are not available, use hand sanitizer. Remove your dressing as told by your health care provider. In 24 hours Do not take baths, swim, or use a hot tub until your health care provider approves. You may shower 24-48 hours after the procedure or as told by your health care provider. Gently wash the site with plain soap and water. Pat the area dry with a clean towel. Do not rub the site. This may cause bleeding. Do not apply powder or lotion to the site. Keep the site clean and dry. Check your femoral site every day for signs of infection. Check for: Redness, swelling, or pain. Fluid or blood. Warmth. Pus or a bad smell. Activity For the first 2-3 days after your procedure, or as long as directed: Avoid climbing stairs as much as possible. Do not squat. Do not lift anything that is heavier than 10 lb (4.5 kg), or the limit that you are told, until your health care provider says that it is safe. For 5 days Rest as directed. Avoid sitting for a long time without moving. Get up to take short walks every 1-2 hours. Do not drive for 24 hours if you were given a medicine to help you relax (sedative). General instructions Take over-the-counter and prescription medicines only as told by your health care provider. Keep all follow-up visits as told by  your health care provider. This is important. Contact a health care provider if you have: A fever or chills. You have redness, swelling, or pain around your insertion site. Get help right away if: The catheter insertion area swells very fast. You pass out. You suddenly start to sweat or your skin gets clammy. The catheter insertion area is bleeding, and the bleeding does not stop when you hold steady pressure on the area. The area near or just beyond the catheter insertion site becomes pale, cool, tingly, or numb. These symptoms may represent a serious problem that is an emergency. Do not wait to see if the symptoms will go away. Get medical help right away. Call your local emergency services (911 in the [place] Do not drive yourself to the hospital. Summary After the procedure, it is common to have bruising that usually fades within 1-2 weeks. Check your femoral site every day for signs of infection. Do not lift anything that is heavier than 10 lb (4.5 kg), or the limit that you are told, until your health care provider says that it is safe. This information is not intended to replace advice given to you by your health care provider. Make sure you discuss any questions you have with your health care provider. Document Revised: 10/29/2017 Document Reviewed: 10/29/2017 Elsevier Patient Education  2020 [employer]  Cardiac Ablation, Care After  This sheet gives you information about how to care for yourself after your procedure. Your health care provider may also give you  more specific instructions. If you have problems or questions, contact your health care provider. What can I expect after the procedure? After the procedure, it is common to have: Bruising around your puncture site. Tenderness around your puncture site. Skipped heartbeats. If you had an atrial fibrillation ablation, you may have atrial fibrillation during the first several months after your procedure.  Tiredness  (fatigue).  Follow these instructions at home: Puncture site care  Follow instructions from your health care provider about how to take care of your puncture site. Make sure you: If present, leave stitches (sutures), skin glue, or adhesive strips in place. These skin closures may need to stay in place for up to 2 weeks. If adhesive strip edges start to loosen and curl up, you may trim the loose edges. Do not remove adhesive strips completely unless your health care provider tells you to do that. If a large square bandage is present, this may be removed 24 hours after surgery.  Check your puncture site every day for signs of infection. Check for: Redness, swelling, or pain. Fluid or blood. If your puncture site starts to bleed, lie down on your back, apply firm pressure to the area, and contact your health care provider. Warmth. Pus or a bad smell. A pea or marble sized lump/knot at the site is normal and can take up to three months to resolve.  Driving Do not drive for at least 4 days after your procedure or however long your health care provider recommends. (Do not resume driving if you have previously been instructed not to drive for other health reasons.) Do not drive or use heavy machinery while taking prescription pain medicine. Activity Avoid activities that take a lot of effort for at least 7 days after your procedure. Do not lift anything that is heavier than 5 lb (4.5 kg) for one week.  No sexual activity for 1 week.  Return to your normal activities as told by your health care provider. Ask your health care provider what activities are safe for you. General instructions Take over-the-counter and prescription medicines only as told by your health care provider. Do not use any products that contain nicotine or tobacco, such as cigarettes and e-cigarettes. If you need help quitting, ask your health care provider. You may shower after 24 hours, but Do not take baths, swim, or use a hot  tub for 1 week.  Do not drink alcohol for 24 hours after your procedure. Keep all follow-up visits as told by your health care provider. This is important. Contact a health care provider if: You have redness, mild swelling, or pain around your puncture site. You have fluid or blood coming from your puncture site that stops after applying firm pressure to the area. Your puncture site feels warm to the touch. You have pus or a bad smell coming from your puncture site. You have a fever. You have chest pain or discomfort that spreads to your neck, jaw, or arm. You have chest pain that is worse with lying on your back or taking a deep breath. You are sweating a lot. You feel nauseous. You have a fast or irregular heartbeat. You have shortness of breath. You are dizzy or light-headed and feel the need to lie down. You have pain or numbness in the arm or leg closest to your puncture site. Get help right away if: Your puncture site suddenly swells. Your puncture site is bleeding and the bleeding does not stop after applying firm pressure to  the area. These symptoms may represent a serious problem that is an emergency. Do not wait to see if the symptoms will go away. Get medical help right away. Call your local emergency services (911 in the [place] Do not drive yourself to the hospital. Summary After the procedure, it is normal to have bruising and tenderness at the puncture site in your groin, neck, or forearm. Check your puncture site every day for signs of infection. Get help right away if your puncture site is bleeding and the bleeding does not stop after applying firm pressure to the area. This is a medical emergency. This information is not intended to replace advice given to you by your health care provider. Make sure you discuss any questions you have with your health care provider.

## 2024-08-21 NOTE — H&P (Signed)
 Electrophysiology Office Note:     Date:  08/21/2024    ID:  Patricia [name], DOB [birth year], MRN [id number]   [facility] Cardiologist:  None  [facility] Electrophysiologist:  OLE ONEIDA HOLTS, MD    Referring MD: Perla Evalene PARAS, MD    Chief Complaint: Atrial fibrillation   History of Present Illness:     [name] is a 62 year old woman who I am seeing today for an evaluation of atrial fibrillation at the request of Dr. Gollan.  The patient has a history of peripheral vascular disease, hypertension, obesity, sleep apnea, anxiety.  She was last seen by Dr. Perla Mar 03, 2024.  At that appointment she reported episodes of symptomatic atrial fibrillation despite up titration of carvedilol .   She is doing well.  She feels fatigued and short of breath when in atrial fibrillation.  Her episodes of atrial fibrillation have become much more frequent recently.  She uses an Scientist, physiological to monitor for symptoms/rhythm.  She tolerates the Eliquis. She reports heavy snoring at night in the past.  This has improved since sleeping in a recliner.  Her atrial fibrillation episodes occur at night mostly.  Presents for AF ablation today. Procedure reviewed.  Objective Their past medical, social and family history was reviewed.     ROS:   Please see the history of present illness.    All other systems reviewed and are negative.   EKGs/Labs/Other Studies Reviewed:     The following studies were reviewed today:   April 29, 2024 echo EF 60-65 RV normal Mild MR   Mar 03, 2024 EKG shows sinus rhythm.  Normal intervals.   Referral notes from February 2025 reviewed.  ZIO monitor ([employer]) demonstrates 8% burden of atrial fibrillation.  These episodes were symptom triggered.          Physical Exam:     VS:  BP 125/54 (BP Location: Left Arm, Patient Position: Sitting, Cuff Size: Normal)   Pulse 69   Ht 5' 4 (1.626 m)   Wt 225 lb 12.8 oz (102.4 kg)   LMP 11/29/2011   SpO2 98%   BMI  38.76 kg/m         Wt Readings from Last 3 Encounters:  05/21/24 225 lb 12.8 oz (102.4 kg)  03/03/24 230 lb (104.3 kg)  06/11/23 235 lb (106.6 kg)      GEN: no distress CARD: RRR, No MRG RESP: No IWOB. CTAB.         Assessment ASSESSMENT AND PLAN:     1. PAF (paroxysmal atrial fibrillation) (HCC)   2. Essential hypertension       #Atrial fibrillation Paroxysmal.  Symptomatic. On Eliquis for stroke prophylaxis.   Discussed treatment options today for AF including antiarrhythmic drug therapy (flecainide, Multaq, Tikosyn, amiodarone) and ablation. Discussed risks, recovery and likelihood of success with each treatment strategy. Risk, benefits, and alternatives to EP study and ablation for afib were discussed. These risks include but are not limited to stroke, bleeding, vascular damage, tamponade, perforation, damage to the esophagus, lungs, phrenic nerve and other structures, pulmonary vein stenosis, worsening renal function, coronary vasospasm and death.  Discussed potential need for repeat ablation procedures and antiarrhythmic drugs after an initial ablation.  I think about her options before letting us  know how she wants to proceed.  If she elects to proceed, Carto, ICE, anesthesia are requested for the procedure.  We will not obtain CT PV protocol prior to the procedure.   #Hypertension At goal  today.  Recommend checking blood pressures 1-2 times per week at home and recording the values.  Recommend bringing these recordings to the primary care physician.   #Suspected sleep apnea Heavy snoring burden.  Nocturnal atrial fibrillation episodes.  Recommend home sleep study.   Presents for AF ablation. Procedure reviewed.     Signed, Ole DASEN. Cindie, MD, [facility], [facility] 08/21/2024 Electrophysiology [facility]

## 2024-08-21 NOTE — Progress Notes (Signed)
 PT ambulated to the bathroom, during ambulation pt developed a hematoma, pt was returned to the bed and pressure was held for 15 minutes by this nurse, and 10 minutes by the charge nurse. Area returned to soft and boggy. Dr Cindie notified new orders of bed rest given. Once 2nd set of bed rest completed pt was ambulated in the hallway no s/s of complications present. Discharge instructions reviewed with patient and Geraldine at bedside. Denies questions or concerns. PT tolerated PO intake and voided without difficulty. PT was escorted fromt he unit via wheel chair to personal vehicle.

## 2024-08-21 NOTE — Transfer of Care (Signed)
 Immediate Anesthesia Transfer of Care Note  Patient: Patricia [name]  Procedure(s) Performed: ATRIAL FIBRILLATION ABLATION  Patient Location: PACU  Anesthesia Type:General  Level of Consciousness: awake, alert , and oriented  Airway & Oxygen Therapy: Patient connected to face mask oxygen  Post-op Assessment: Report given to RN and Post -op Vital signs reviewed and stable  Post vital signs: Reviewed and stable  Last Vitals:  Vitals Value Taken Time  BP 120/62 08/21/24 09:15  Temp    Pulse 73 08/21/24 09:16  Resp 18 08/21/24 09:15  SpO2 93 % 08/21/24 09:16  Vitals shown include unfiled device data.  Last Pain:  Vitals:   08/21/24 0554  TempSrc:   PainSc: 0-No pain         Complications: No notable events documented.

## 2024-08-22 ENCOUNTER — Encounter (HOSPITAL_COMMUNITY): Payer: Self-pay | Admitting: Cardiology

## 2024-08-22 ENCOUNTER — Telehealth (HOSPITAL_COMMUNITY): Payer: Self-pay

## 2024-08-22 MED FILL — Fentanyl Citrate Preservative Free (PF) Inj 100 MCG/2ML: INTRAMUSCULAR | Qty: 2 | Status: AC

## 2024-08-22 MED FILL — Fentanyl Citrate Preservative Free (PF) Inj 100 MCG/2ML: INTRAMUSCULAR | Qty: 2 | Status: CN

## 2024-08-22 NOTE — Telephone Encounter (Signed)
 Spoke with patient to complete post procedure follow up call.  Patient reports no complications with groin sites.   Instructions reviewed with patient:  Remove large bandage at puncture site after 24 hours. It is normal to have bruising, tenderness, mild swelling, and a pea or marble sized lump/knot at the groin site which can take up to three months to resolve.  Get help right away if you notice sudden swelling at the puncture site.  Check your puncture site every day for signs of infection: fever, redness, swelling, pus drainage, warmth, foul odor or excessive pain. If this occurs, please call [phone], to speak with the RN Navigator. Get help right away if your puncture site is bleeding and the bleeding does not stop after applying firm pressure to the area.  You may continue to have skipped beats/ atrial fibrillation during the first several months after your procedure.  It is very important not to miss any doses of your blood thinner Eliquis.    You will follow up with the APP 4 weeks after your procedure and follow up with the APP 3 months after your procedure.  Activity restrictions reviewed.  Patient verbalized understanding to all instructions provided.

## 2024-09-17 NOTE — Progress Notes (Unsigned)
 Electrophysiology Clinic Note    Date:  09/18/2024  Patient ID:  Patricia [name], Patricia [name] [birth year], MRN [id number] PCP:  Diedra Lame, MD  Cardiologist:  None  Electrophysiologist:  OLE ONEIDA HOLTS, MD  Electrophysiology APP:  Mandisa Persinger, NP      Discussed the use of AI scribe software for clinical note transcription with the patient, who gave verbal consent to proceed.   Patient Profile    Chief Complaint: AF ablation follow-up  History of Present Illness: Patricia [name] is a 63 y.o. female with PMH notable for parox afib, PVD, HTN, OSA, anxiety; seen today for OLE ONEIDA HOLTS, MD for routine electrophysiology follow-up s/p AF Ablation.  She is s/p AF ablation w isolation of pulm veins on 10/23 by Dr. Holts.   On follow-up today, she has not had any atrial fibrillation episodes since her ablation.  She uses Apple watch but historically could feel her A-fib prior to her Apple Watch notifications.  She continues to take flecainide and Coreg  twice daily without missing doses.  She has no concerns related to her groin access site.  She denies chest pain, chest pressure, palpitations.  No bleeding concerns on Eliquis .    Arrhythmia/Device History Flecainide     ROS:  Please see the history of present illness. All other systems are reviewed and otherwise negative.    Physical Exam    VS:  BP 118/68 (BP Location: Left Arm, Patient Position: Sitting, Cuff Size: Normal)   Pulse 65   Ht 5' 4 (1.626 m)   Wt 232 lb 9.6 oz (105.5 kg)   LMP 11/29/2011   SpO2 97%   BMI 39.93 kg/m  BMI: Body mass index is 39.93 kg/m.      Wt Readings from Last 3 Encounters:  09/18/24 232 lb 9.6 oz (105.5 kg)  08/21/24 230 lb (104.3 kg)  08/01/24 232 lb 3.2 oz (105.3 kg)     GEN- The patient is well appearing, alert and oriented x 3 today.   Lungs- Clear to ausculation bilaterally, normal work of breathing.  Heart- Regular rate and rhythm, no murmurs, rubs or  gallops Extremities- No peripheral edema, warm, dry. Bialteral groins c/d/I without bruising, tenderness, swelling   Studies Reviewed   Previous EP, cardiology notes.    EKG is ordered. Personal review of EKG from today shows:    EKG Interpretation Date/Time:  Thursday September 18 2024 10:42:43 EST Ventricular Rate:  65 PR Interval:  186 QRS Duration:  84 QT Interval:  412 QTC Calculation: 428 R Axis:   4  Text Interpretation: Normal sinus rhythm Normal ECG Confirmed by Azariah Bonura [phone]) on 09/18/2024 10:54:00 AM     1023/2025 EKG - SR at 71. PR 172, QRS 86, QTC 449  TTE, 04/28/2024  1. Left ventricular ejection fraction, by estimation, is 60 to 65%. The left ventricle has normal function. The left ventricle has no regional wall motion abnormalities. Left ventricular diastolic parameters were normal. The average left ventricular global longitudinal strain is -18.0 %. The global longitudinal strain is normal.   2. Right ventricular systolic function is normal. The right ventricular size is normal.   3. The mitral valve is normal in structure. Mild mitral valve regurgitation.   4. The aortic valve is tricuspid. Aortic valve regurgitation is not visualized.   5. The inferior vena cava is normal in size with greater than 50% respiratory variability, suggesting right atrial pressure of 3 mmHg.    Assessment and  Plan     #) parox Afib #) flecainide monitoring S/p AF ablation 07/2024 by Dr. Cindie She is maintaining sinus rhythm since procedure Will stop Flecainide Continue 12.5 mg coreg  BID   #) Hypercoag d/t parox afib CHA2DS2-VASc Score = at least 3 [CHF History: 0, HTN History: 1, Diabetes History: 0, Stroke History: 0, Vascular Disease History: 1, Age Score: 0, Gender Score: 1].  Therefore, the patient's annual risk of stroke is 3.2 %.    Stroke ppx - 5mg  eliquis BID, appropriately dosed No bleeding concerns  #) HTN Well-controlled today in office.  Recommend she  continue to check blood pressure 2-3 times per week at home and bring BP log to follow-up appointments      Current medicines are reviewed at length with the patient today.   The patient has concerns regarding her medicines.  The following changes were made today:   STOP flecainide  Labs/ tests ordered today include:  Orders Placed This Encounter  Procedures   EKG 12-Lead     Disposition: Follow up with EP Team in 2 months    Signed, Adyen Bifulco, NP  09/18/24  1:27 PM  Electrophysiology [facility]

## 2024-09-18 ENCOUNTER — Encounter: Payer: Self-pay | Admitting: Cardiology

## 2024-09-18 ENCOUNTER — Ambulatory Visit: Attending: Cardiology | Admitting: Cardiology

## 2024-09-18 VITALS — BP 118/68 | HR 65 | Ht 64.0 in | Wt 232.6 lb

## 2024-09-18 DIAGNOSIS — I10 Essential (primary) hypertension: Secondary | ICD-10-CM

## 2024-09-18 DIAGNOSIS — D6869 Other thrombophilia: Secondary | ICD-10-CM

## 2024-09-18 DIAGNOSIS — Z5181 Encounter for therapeutic drug level monitoring: Secondary | ICD-10-CM

## 2024-09-18 DIAGNOSIS — Z79899 Other long term (current) drug therapy: Secondary | ICD-10-CM

## 2024-09-18 DIAGNOSIS — I480 Paroxysmal atrial fibrillation: Secondary | ICD-10-CM | POA: Diagnosis not present

## 2024-09-18 NOTE — Addendum Note (Signed)
 Addended by: CRISTOPHER OLIVIA PARAS on: 09/18/2024 02:03 PM   Modules accepted: Orders

## 2024-09-18 NOTE — Patient Instructions (Signed)
 Medication Instructions:  Your physician recommends the following medication changes.  STOP TAKING: Colchicine  Flecainide     Lab Work: No labs ordered today  If you have labs (blood work) drawn today and your tests are completely normal, you will receive your results only by: MyChart Message (if you have MyChart) OR A paper copy in the mail If you have any lab test that is abnormal or we need to change your treatment, we will call you to review the results.  Testing/Procedures: No test ordered today   Follow-Up: At [facility], you and your health needs are our priority.  As part of our continuing mission to provide you with exceptional heart care, our providers are all part of one team.  This team includes your primary Cardiologist (physician) and Advanced Practice Providers or APPs (Physician Assistants and Nurse Practitioners) who all work together to provide you with the care you need, when you need it.  Your next appointment:   November 19, 2024 at 1:55 pm.  Provider:   Suzann Riddle, NP    We recommend signing up for the patient portal called MyChart.  Sign up information is provided on this After Visit Summary.  MyChart is used to connect with patients for Virtual Visits (Telemedicine).  Patients are able to view lab/test results, encounter notes, upcoming appointments, etc.  Non-urgent messages can be sent to your provider as well.   To learn more about what you can do with MyChart, go to [email or web address].   Other Instructions Monitor blood pressure and bring the log to your January appointment.

## 2024-09-30 ENCOUNTER — Other Ambulatory Visit (HOSPITAL_COMMUNITY): Payer: Self-pay

## 2024-10-01 ENCOUNTER — Telehealth: Payer: Self-pay | Admitting: Cardiology

## 2024-10-01 NOTE — Telephone Encounter (Signed)
   Pre-operative Risk Assessment    Patient Name: Patricia [name]  DOB: 06-22-1961 MRN: [id number]  Date of last office visit: 09/18/24 Date of next office visit: 11/19/24  Request for Surgical Clearance    Procedure:  CLEANING AND FURTHER PROCEDURES, WILL SEND A SEPARATE REQUEST IF EXTRACTIONS ARE NEEDED \ Date of Surgery:  Clearance TBD                               Surgeon:  DR DEBBY LING Surgeon's Group or Practice Name:  [facility] Phone number:  [phone] Fax number:  [phone] uested:   - Medical    Type of Anesthesia:  Not Indicated   Additional requests/questions:    SignedSamule LITTIE Bristol   10/01/2024, 12:50 PM

## 2024-10-01 NOTE — Telephone Encounter (Signed)
   Patient Name: DEREKA LUERAS  DOB: 02/07/1961 MRN: [id number]  Primary Cardiologist: None  Chart reviewed as part of pre-operative protocol coverage.   Simple dental extractions (i.e. 1-2 teeth) are considered low risk procedures per guidelines and generally do not require any specific cardiac clearance. It is also generally accepted that for simple extractions and dental cleanings, there is no need to interrupt blood thinner therapy.   SBE prophylaxis is not required for the patient from a cardiac standpoint.  I will route this recommendation to the requesting party via Epic fax function and remove from pre-op pool.  Please call with questions.  Jon Garre Torre Pikus, PA 10/01/2024, 2:20 PM

## 2024-11-18 NOTE — Progress Notes (Unsigned)
 "     Electrophysiology Clinic Note    Date:  11/19/2024  Patient ID:  Patricia [name], Patricia [name] 10-03-1961, MRN [id number] PCP:  Diedra Lame, MD  Cardiologist:  None  Electrophysiologist:  Fonda Kitty, MD  Electrophysiology APP:  Kailani Brass, NP      Discussed the use of AI scribe software for clinical note transcription with the patient, who gave verbal consent to proceed.   Patient Profile    Chief Complaint: AF ablation follow-up  History of Present Illness: Patricia [name] is a 64 y.o. female with PMH notable for parox afib, PVD, HTN, anxiety; seen today for Fonda Kitty, MD (Previously Dr. Cindie) for routine electrophysiology follow-up s/p AF Ablation.  She is s/p AF ablation w isolation of pulm veins on 10/23 by Dr. Cindie.  I saw her 08/2024 for 1 month routine follow-up after ablation where she had not had any A-fib episodes.  Flecainide  was stopped at that time.  On follow-up today, she has not had any atrial fibrillation episodes that she is aware of.  She does note that she is under considerably increased stress related to caring for her mother who recently entered hospice.  She admits to very poor sleep, poor nutrition, and very limited physical activity during this stage of life.  She denies chest pain, chest pressure, palpitations, or dizziness..  She does have increased fatigue, but believes that is due to poor quality of sleep.  She continues to take Eliquis  twice a day with no bleeding concerns.   She brings in BP log from her wrist blood pressure machine.  Most readings 130s - 150 systolic, though she notes that in medical offices her blood pressure is always much lower than her home BP machine readings.  She has a friend coming to the house later who has a manual blood pressure cuff.  She plans to ask the friend to check her blood pressure against her wrist BP machine. .    Arrhythmia/Device History Flecainide  - stopped after ablation    ROS:  Please see  the history of present illness. All other systems are reviewed and otherwise negative.    Physical Exam    VS:  BP 118/74 (BP Location: Left Arm, Patient Position: Sitting, Cuff Size: Normal)   Pulse 71   Ht 5' 4 (1.626 m)   Wt 237 lb (107.5 kg)   LMP 11/29/2011   SpO2 97%   BMI 40.68 kg/m  BMI: Body mass index is 40.68 kg/m.      Wt Readings from Last 3 Encounters:  11/19/24 237 lb (107.5 kg)  09/18/24 232 lb 9.6 oz (105.5 kg)  08/21/24 230 lb (104.3 kg)     GEN- The patient is well appearing, alert and oriented x 3 today.   Lungs- Clear to ausculation bilaterally, normal work of breathing.  Heart- Regular rate and rhythm, no murmurs, rubs or gallops Extremities- No peripheral edema, warm, dry.   Studies Reviewed   Previous EP, cardiology notes.    EKG is ordered. Personal review of EKG from today shows:    EKG Interpretation Date/Time:  Wednesday November 19 2024 14:17:28 EST Ventricular Rate:  71 PR Interval:  142 QRS Duration:  76 QT Interval:  394 QTC Calculation: 428 R Axis:   56  Text Interpretation: Normal sinus rhythm Confirmed by Floella Ensz [phone]) on 11/19/2024 2:26:58 PM     TTE, 04/28/2024  1. Left ventricular ejection fraction, by estimation, is 60 to 65%. The left ventricle has  normal function. The left ventricle has no regional wall motion abnormalities. Left ventricular diastolic parameters were normal. The average left ventricular global longitudinal strain is -18.0 %. The global longitudinal strain is normal.   2. Right ventricular systolic function is normal. The right ventricular size is normal.   3. The mitral valve is normal in structure. Mild mitral valve regurgitation.   4. The aortic valve is tricuspid. Aortic valve regurgitation is not visualized.   5. The inferior vena cava is normal in size with greater than 50% respiratory variability, suggesting right atrial pressure of 3 mmHg.    Assessment and Plan     #) parox Afib S/p AF  ablation 07/2024 by Dr. Cindie Recently stopped flecainide  and maintaining sinus rhythm Continue 12.5 mg Coreg  twice daily  #) Hypercoag d/t parox afib CHA2DS2-VASc Score = at least 3 [CHF History: 0, HTN History: 1, Diabetes History: 0, Stroke History: 0, Vascular Disease History: 1, Age Score: 0, Gender Score: 1].  Therefore, the patient's annual risk of stroke is 3.2 %.    Stroke ppx - 5mg  eliquis  BID, appropriately dosed No bleeding concerns She desires to stop eliquis  if BP can be managed with lifestyle modifications alone  #) HTN Well-controlled today in office, but elevated on home readings Encouraged checking BP machine against manual cuff to ensure correct BP machine readings     Current medicines are reviewed at length with the patient today.   The patient has concerns regarding her medicines.  The following changes were made today:   none  Labs/ tests ordered today include:  Orders Placed This Encounter  Procedures   EKG 12-Lead     Disposition: Follow up with EP APP in 12 months  Follow-up with general cardiology in 6 months   Signed, Jadaya Sommerfield, NP  11/19/24  3:47 PM  Electrophysiology [facility] "

## 2024-11-19 ENCOUNTER — Encounter: Payer: Self-pay | Admitting: Cardiology

## 2024-11-19 ENCOUNTER — Ambulatory Visit: Payer: Self-pay | Attending: Cardiology | Admitting: Cardiology

## 2024-11-19 VITALS — BP 118/74 | HR 71 | Ht 64.0 in | Wt 237.0 lb

## 2024-11-19 DIAGNOSIS — D6869 Other thrombophilia: Secondary | ICD-10-CM | POA: Diagnosis not present

## 2024-11-19 DIAGNOSIS — I10 Essential (primary) hypertension: Secondary | ICD-10-CM

## 2024-11-19 DIAGNOSIS — I480 Paroxysmal atrial fibrillation: Secondary | ICD-10-CM | POA: Diagnosis not present

## 2024-11-19 MED ORDER — ELIQUIS 5 MG PO TABS: 5.0000 mg | ORAL_TABLET | Freq: Two times a day (BID) | ORAL | 3 refills | Status: AC

## 2024-11-19 NOTE — Patient Instructions (Signed)
 Medication Instructions:  Your physician recommends that you continue on your current medications as directed. Please refer to the Current Medication list given to you today.  *If you need a refill on your cardiac medications before your next appointment, please call your pharmacy*  Lab Work: No labs ordered today    Testing/Procedures: No test ordered today   Follow-Up: At [facility], you and your health needs are our priority.  As part of our continuing mission to provide you with exceptional heart care, our providers are all part of one team.  This team includes your primary Cardiologist (physician) and Advanced Practice Providers or APPs (Physician Assistants and Nurse Practitioners) who all work together to provide you with the care you need, when you need it.  Your next appointment:   6 month(s)  Provider:   Dr. Perla   Electrophysiology, 1 year follow up with Suzann Riddle, NP

## 2024-11-21 ENCOUNTER — Telehealth: Payer: Self-pay | Admitting: Cardiology

## 2024-11-21 NOTE — Telephone Encounter (Signed)
"  ° °  Pre-operative Risk Assessment    Patient Name: Patricia [name]  DOB: 19-Nov-1960 MRN: [id number]   Date of last office visit: 11/19/24 Date of next office visit: n/a  Request for Surgical Clearance    Procedure:  Dental Extraction - Amount of Teeth to be Pulled:  Dental implant placed in [place] with a sinus lift, minimal bleeding   Date of Surgery:  Clearance TBD                                Surgeon:  Abby Alert Surgeon's Group or Practice Name:  [facility] Phone number:  [phone] Fax number:  [phone]   Type of Clearance Requested:   - Pharmacy:  Hold Apixaban  (Eliquis )     Type of Anesthesia:  Not Indicated   Additional requests/questions:    Signed, Arsenio Fang V   11/21/2024, 1:50 PM   "

## 2024-11-21 NOTE — Telephone Encounter (Signed)
 Please advise holding Eliquis  prior to dental implant. Last labs 07/2024.   Thank you!  AW

## 2024-11-24 NOTE — Telephone Encounter (Signed)
 Patient with diagnosis of Afib on Eliquis  for anticoagulation.    Procedure: Dental Extraction - Amount of Teeth to be Pulled:  Dental implant placed in [place] with a sinus lift, minimal bleeding    Date of procedure: TBD   CHA2DS2-VASc Score = 3   This indicates a 3.2% annual risk of stroke. The patient's score is based upon: CHF History: 0 HTN History: 1 Diabetes History: 0 Stroke History: 0 Vascular Disease History: 1 Age Score: 0 Gender Score: 1     CrCl 112 mL/min Platelet count 263 K  Patient has  had an Afib/aflutter ablation on 08/21/2024  3 months from the procedure would be 11/21/2024    Patient does not require pre-op antibiotics for dental procedure.  Per office protocol, patient can hold Eliquis  for 1 days prior to procedure.     **This guidance is not considered finalized until pre-operative APP has relayed final recommendations.**

## 2024-11-25 NOTE — Telephone Encounter (Signed)
"  ° °  Patient Name: Patricia [name]  DOB: 1961/02/02 MRN: [id number]  Primary Cardiologist: None  Chart reviewed as part of pre-operative protocol coverage. We were asked to give our recommendations for holding Eliquis  for upcoming dental procedure. Per Pharmacy and office protocol, patient can hold Eliquis  for 1 day prior to procedure. Please restart as soon as safely possible afterwards.   SBE prophylaxis is not required for the patient from a cardiac standpoint.   I will route this recommendation to the requesting party via Epic fax function and remove from pre-op pool.  Please call with questions.  Jessieca Rhem E Dvonte Gatliff, PA-C 11/25/2024, 7:55 PM  "
